# Patient Record
Sex: Female | Born: 1935 | Race: White | Hispanic: No | Smoking: Former smoker
Health system: Southern US, Community
[De-identification: ages and names within clinical notes are randomized; demographics above are authoritative.]

## PROBLEM LIST (undated history)

## (undated) DIAGNOSIS — F329 Major depressive disorder, single episode, unspecified: Secondary | ICD-10-CM

## (undated) DIAGNOSIS — E785 Hyperlipidemia, unspecified: Secondary | ICD-10-CM

## (undated) DIAGNOSIS — E039 Hypothyroidism, unspecified: Secondary | ICD-10-CM

## (undated) DIAGNOSIS — C801 Malignant (primary) neoplasm, unspecified: Secondary | ICD-10-CM

## (undated) DIAGNOSIS — M1990 Unspecified osteoarthritis, unspecified site: Secondary | ICD-10-CM

## (undated) DIAGNOSIS — IMO0001 Reserved for inherently not codable concepts without codable children: Secondary | ICD-10-CM

## (undated) DIAGNOSIS — K219 Gastro-esophageal reflux disease without esophagitis: Secondary | ICD-10-CM

## (undated) DIAGNOSIS — F0390 Unspecified dementia without behavioral disturbance: Secondary | ICD-10-CM

## (undated) DIAGNOSIS — Z8719 Personal history of other diseases of the digestive system: Secondary | ICD-10-CM

## (undated) DIAGNOSIS — N189 Chronic kidney disease, unspecified: Secondary | ICD-10-CM

## (undated) DIAGNOSIS — E079 Disorder of thyroid, unspecified: Secondary | ICD-10-CM

## (undated) DIAGNOSIS — I10 Essential (primary) hypertension: Secondary | ICD-10-CM

## (undated) DIAGNOSIS — N289 Disorder of kidney and ureter, unspecified: Secondary | ICD-10-CM

## (undated) DIAGNOSIS — K429 Umbilical hernia without obstruction or gangrene: Secondary | ICD-10-CM

## (undated) DIAGNOSIS — J189 Pneumonia, unspecified organism: Secondary | ICD-10-CM

## (undated) DIAGNOSIS — Z5189 Encounter for other specified aftercare: Secondary | ICD-10-CM

## (undated) DIAGNOSIS — G629 Polyneuropathy, unspecified: Secondary | ICD-10-CM

## (undated) DIAGNOSIS — I4891 Unspecified atrial fibrillation: Secondary | ICD-10-CM

## (undated) DIAGNOSIS — F32A Depression, unspecified: Secondary | ICD-10-CM

## (undated) DIAGNOSIS — N390 Urinary tract infection, site not specified: Secondary | ICD-10-CM

## (undated) HISTORY — PX: BACK SURGERY: SHX140

## (undated) HISTORY — PX: EYE SURGERY: SHX253

## (undated) HISTORY — PX: BREAST SURGERY: SHX581

## (undated) HISTORY — PX: JOINT REPLACEMENT: SHX530

---

## 2005-11-18 ENCOUNTER — Inpatient Hospital Stay (HOSPITAL_COMMUNITY): Admission: RE | Admit: 2005-11-18 | Discharge: 2005-11-22 | Payer: Self-pay | Admitting: Orthopedic Surgery

## 2006-11-17 ENCOUNTER — Inpatient Hospital Stay (HOSPITAL_COMMUNITY): Admission: RE | Admit: 2006-11-17 | Discharge: 2006-11-21 | Payer: Self-pay | Admitting: Orthopedic Surgery

## 2008-03-29 ENCOUNTER — Emergency Department (HOSPITAL_BASED_OUTPATIENT_CLINIC_OR_DEPARTMENT_OTHER): Admission: EM | Admit: 2008-03-29 | Discharge: 2008-03-29 | Payer: Self-pay | Admitting: Emergency Medicine

## 2008-09-28 ENCOUNTER — Encounter: Admission: RE | Admit: 2008-09-28 | Discharge: 2008-12-05 | Payer: Self-pay | Admitting: Internal Medicine

## 2009-01-24 ENCOUNTER — Ambulatory Visit: Payer: Self-pay | Admitting: Diagnostic Radiology

## 2009-01-24 ENCOUNTER — Emergency Department (HOSPITAL_BASED_OUTPATIENT_CLINIC_OR_DEPARTMENT_OTHER): Admission: EM | Admit: 2009-01-24 | Discharge: 2009-01-24 | Payer: Self-pay | Admitting: Emergency Medicine

## 2009-03-01 ENCOUNTER — Encounter: Admission: RE | Admit: 2009-03-01 | Discharge: 2009-03-01 | Payer: Self-pay | Admitting: General Surgery

## 2009-10-10 ENCOUNTER — Inpatient Hospital Stay (HOSPITAL_COMMUNITY): Admission: EM | Admit: 2009-10-10 | Discharge: 2009-10-12 | Payer: Self-pay | Admitting: Emergency Medicine

## 2010-05-18 LAB — CBC
HCT: 36.7 % (ref 36.0–46.0)
Hemoglobin: 12.1 g/dL (ref 12.0–15.0)
MCH: 33.4 pg (ref 26.0–34.0)
MCH: 34.2 pg — ABNORMAL HIGH (ref 26.0–34.0)
MCHC: 34.4 g/dL (ref 30.0–36.0)
MCHC: 34.5 g/dL (ref 30.0–36.0)
MCHC: 35.7 g/dL (ref 30.0–36.0)
MCV: 95.5 fL (ref 78.0–100.0)
MCV: 97.4 fL (ref 78.0–100.0)
MCV: 98 fL (ref 78.0–100.0)
Platelets: 188 10*3/uL (ref 150–400)
Platelets: 199 10*3/uL (ref 150–400)
Platelets: 209 10*3/uL (ref 150–400)
Platelets: 254 10*3/uL (ref 150–400)
RBC: 3.61 MIL/uL — ABNORMAL LOW (ref 3.87–5.11)
RBC: 4.12 MIL/uL (ref 3.87–5.11)
RDW: 13 % (ref 11.5–15.5)
RDW: 13.2 % (ref 11.5–15.5)
WBC: 11.7 10*3/uL — ABNORMAL HIGH (ref 4.0–10.5)
WBC: 5.2 10*3/uL (ref 4.0–10.5)

## 2010-05-18 LAB — COMPREHENSIVE METABOLIC PANEL
ALT: 17 U/L (ref 0–35)
AST: 19 U/L (ref 0–37)
AST: 25 U/L (ref 0–37)
AST: 29 U/L (ref 0–37)
Albumin: 3.4 g/dL — ABNORMAL LOW (ref 3.5–5.2)
Albumin: 3.6 g/dL (ref 3.5–5.2)
BUN: 21 mg/dL (ref 6–23)
BUN: 29 mg/dL — ABNORMAL HIGH (ref 6–23)
Calcium: 8.7 mg/dL (ref 8.4–10.5)
Calcium: 8.9 mg/dL (ref 8.4–10.5)
Creatinine, Ser: 0.78 mg/dL (ref 0.4–1.2)
Creatinine, Ser: 0.9 mg/dL (ref 0.4–1.2)
Creatinine, Ser: 0.93 mg/dL (ref 0.4–1.2)
GFR calc Af Amer: >60 mL/min (ref >60)
GFR calc Af Amer: >60 mL/min (ref >60)
GFR calc non Af Amer: 59 mL/min — ABNORMAL LOW (ref >60)
Glucose, Bld: 114 mg/dL — ABNORMAL HIGH (ref 70–99)
Glucose, Bld: 173 mg/dL — ABNORMAL HIGH (ref 70–99)
Potassium: 3.9 mEq/L (ref 3.5–5.1)
Sodium: 136 mEq/L (ref 135–145)
Total Bilirubin: 0.8 mg/dL (ref 0.3–1.2)
Total Bilirubin: 1.2 mg/dL (ref 0.3–1.2)
Total Protein: 6.4 g/dL (ref 6.0–8.3)

## 2010-05-18 LAB — GLUCOSE, CAPILLARY
Glucose-Capillary: 104 mg/dL — ABNORMAL HIGH (ref 70–99)
Glucose-Capillary: 114 mg/dL — ABNORMAL HIGH (ref 70–99)
Glucose-Capillary: 117 mg/dL — ABNORMAL HIGH (ref 70–99)
Glucose-Capillary: 118 mg/dL — ABNORMAL HIGH (ref 70–99)
Glucose-Capillary: 138 mg/dL — ABNORMAL HIGH (ref 70–99)
Glucose-Capillary: 151 mg/dL — ABNORMAL HIGH (ref 70–99)
Glucose-Capillary: 85 mg/dL (ref 70–99)
Glucose-Capillary: 88 mg/dL (ref 70–99)
Glucose-Capillary: 93 mg/dL (ref 70–99)
Glucose-Capillary: 96 mg/dL (ref 70–99)

## 2010-05-18 LAB — BASIC METABOLIC PANEL
Chloride: 112 mEq/L (ref 96–112)
Creatinine, Ser: 0.88 mg/dL (ref 0.4–1.2)
GFR calc Af Amer: >60 mL/min (ref >60)
Potassium: 4 mEq/L (ref 3.5–5.1)
Sodium: 143 mEq/L (ref 135–145)

## 2010-05-18 LAB — HEMOGLOBIN A1C
Hgb A1c MFr Bld: 5.9 % — ABNORMAL HIGH (ref <5.7)
Mean Plasma Glucose: 123 mg/dL — ABNORMAL HIGH (ref <117)

## 2010-05-18 LAB — URINALYSIS, ROUTINE W REFLEX MICROSCOPIC
Bilirubin Urine: NEGATIVE
Glucose, UA: NEGATIVE mg/dL
Nitrite: POSITIVE — AB
Specific Gravity, Urine: 1.018 (ref 1.005–1.030)
pH: 8 (ref 5.0–8.0)

## 2010-05-18 LAB — CULTURE, BLOOD (ROUTINE X 2): Culture: NO GROWTH

## 2010-05-18 LAB — TSH: TSH: 4.135 u[IU]/mL (ref 0.350–4.500)

## 2010-05-18 LAB — LIPASE, BLOOD: Lipase: 24 U/L (ref 11–59)

## 2010-05-18 LAB — DIFFERENTIAL
Basophils Absolute: 0 10*3/uL (ref 0.0–0.1)
Basophils Relative: 0 % (ref 0–1)
Lymphocytes Relative: 11 % — ABNORMAL LOW (ref 12–46)
Lymphs Abs: 1.3 10*3/uL (ref 0.7–4.0)
Monocytes Absolute: 0.5 10*3/uL (ref 0.1–1.0)

## 2010-05-18 LAB — URINE CULTURE: Culture  Setup Time: 201108091052

## 2010-06-18 LAB — BASIC METABOLIC PANEL
CO2: 28 mEq/L (ref 19–32)
Calcium: 9.1 mg/dL (ref 8.4–10.5)
Creatinine, Ser: 0.8 mg/dL (ref 0.4–1.2)
Glucose, Bld: 212 mg/dL — ABNORMAL HIGH (ref 70–99)
Sodium: 137 mEq/L (ref 135–145)

## 2010-06-18 LAB — DIFFERENTIAL
Basophils Absolute: 0.7 10*3/uL — ABNORMAL HIGH (ref 0.0–0.1)
Eosinophils Absolute: 0 10*3/uL (ref 0.0–0.7)
Lymphocytes Relative: 2 % — ABNORMAL LOW (ref 12–46)
Monocytes Absolute: 0.2 10*3/uL (ref 0.1–1.0)
Neutro Abs: 20.8 10*3/uL — ABNORMAL HIGH (ref 1.7–7.7)

## 2010-06-18 LAB — CBC
Hemoglobin: 12.8 g/dL (ref 12.0–15.0)
MCHC: 34.3 g/dL (ref 30.0–36.0)
RDW: 12.9 % (ref 11.5–15.5)

## 2010-06-18 LAB — URINALYSIS, ROUTINE W REFLEX MICROSCOPIC
Bilirubin Urine: NEGATIVE
Glucose, UA: 250 mg/dL — AB
Ketones, ur: NEGATIVE mg/dL
pH: 6 (ref 5.0–8.0)

## 2010-06-18 LAB — URINE CULTURE: Colony Count: >=100000

## 2010-06-18 LAB — GLUCOSE, CAPILLARY

## 2010-06-18 LAB — URINE MICROSCOPIC-ADD ON

## 2010-07-17 NOTE — Op Note (Signed)
Jodi Price, Jodi Price               ACCOUNT NO.:  [id number]   MEDICAL RECORD NO.:  [id number]          PATIENT TYPE:  INP   LOCATION:  2899                         FACILITY:  [facility]   PHYSICIAN:  Mila Homer. Sherlean Foot, M.D. DATE OF BIRTH:  [birth year]   DATE OF PROCEDURE:  11/17/2006  DATE OF DISCHARGE:                               OPERATIVE REPORT   SURGEON:  Mila Homer. Sherlean Foot, M.D.   ASSISTANT:  Legrand Pitts. Duffy, P.A.   ANESTHESIA:  General.   ANESTHESIA:  Spinal.   PREOPERATIVE DIAGNOSIS:  Left left osteoarthritis.   POSTOP DIAGNOSIS:  Left left osteoarthritis.   PROCEDURE:  Left total knee arthroplasty.   INDICATIONS FOR PROCEDURE:  The patient is an 87-year white female with  failure of conservative measures for osteoarthritis of the left knee.  Informed consent was obtained.   DESCRIPTION OF PROCEDURE:  The patient was administered spinal  anesthesia in the flexed seated position, and then laid supine and a  Foley catheter was placed.  I then sterilely prepped and draped the left  leg.  I then exsanguinated the left leg with the Esmarch and elevated  the tourniquet to 350 mmHg.  I then used a 10-blade to make a midline  incision.  I used a fresh blade to make a medial parapatellar arthrotomy  and performed a synovectomy.  I then elevated the deep MCL off the  medial crest of the tibia, but not releasing much distally since this  was a valgus knee.   Then I used the extramedullary alignment system on the tibia to make a  perpendicular cut to the anatomic axis of tibia.  I then used the  intramedullary alignment system on the femur to make a 4-degree valgus  distal femoral cut.  I sized this to a size D, and pinned the 4:1  cutting block into place, and made the anterior, posterior, and chamfer  cuts with the sagittal saw.   I then removed the bony pieces and then placed the lamina spreader in  the knee and removed the medial and lateral menisci, ACL, PCL, and  medial  posterior condylar osteophytes.  I then placed a 10-mm spacer  block in the knee and had good flexion/extension gap balance.  I then  finished the femur with a size D finishing block, finished the tibia  with a size 4 tibial tray.  I then trialed with a size 4 tibia, size D  femur, size 10 insert, and size 29 patella.  I had good  flexion/extension gap balance, good drop in angle back to the 130  degrees.  The patella tracked well.  I then removed the trial components  and copiously irrigated.  I then cemented in a size 4 patella, size D  femur, snapped in a real 10 polyethylene, removed all the excess cement,  cemented in the 29 patella.  I then dropped the tourniquet when the  cement was hard.  This was a 44 minus.  I then copiously irrigated,  again.  I then closed the arthrotomy with figure-of-eight #1 Vicryl  sutures, deep soft tissues with buried #  0 Vicryl sutures, and then a  subcuticular 3-0 stitch with skin staples.  Dressed with Xeroform  dressing, sponges, and sterile Webril and TED stocking.   COMPLICATIONS:  None.   DRAINS:  One Hemovac and one pain catheter.   ESTIMATED BLOOD LOSS:  300 mL.   TOURNIQUET TIME:  44 minutes.           ______________________________  Mila Homer Sherlean Foot, M.D.     SDL/MEDQ  D:  11/17/2006  T:  11/17/2006  Job:  [id number]

## 2010-07-20 NOTE — H&P (Signed)
Jodi Price, BHULLAR               ACCOUNT NO.:  [id number]   MEDICAL RECORD NO.:  [id number]          PATIENT TYPE:  INP   LOCATION:  NA                           FACILITY:  [facility]   PHYSICIAN:  Mila Homer. Sherlean Foot, M.D. DATE OF BIRTH:  29-Mar-1919   DATE OF ADMISSION:  11/18/2005  DATE OF DISCHARGE:                                HISTORY & PHYSICAL   CHIEF COMPLAINT:  Bilateral knee pain, right greater than left.   HISTORY OF PRESENT ILLNESS:  Jodi Price is an 77 year old white female with  bilateral leg pain, right greater than left.  The patient has had knee pain  for years.  The right knee pain became progressively worse.  No known injury  to the right knee.  Right knee pain increases at night and with prolonged  ambulation.  Mechanical symptoms are positive for giving way and catching.  The patient needs a walker or cane to ambulate; she also uses a pullover  knee sleeve with some relief.  She has failed conservative treatment, which  included Synvisc injections.  X-rays of the right knee showed bone-on-bone  in medial and lateral compartments.   ALLERGIES:  No known drug allergies.   MEDICATIONS:  1. Toprol 100 mg one daily.  2. Benicar 40/25 one daily.  3. Prevacid 30 mg one daily in the a.m.  4. Glipizide 2.5 mg one b.i.d.  5. Levothroid 75 mcg one daily.  6. Vytorin 10/20 one tablet nightly.  7. Actonel 30 mg one weekly in a.m.  8. Adalat 400 mg b.i.d.  9. Percocet 5/325 mg one tablet q.4-6 h. as needed.   PAST MEDICAL HISTORY:  1. Diabetes mellitus.  2. Hypertension.  3. Coronary artery disease.  4. Mild normocytic anemia.  5. Mild renal insufficiency.  6. First-degree AV block.  7. History of bilateral DVTs in 1950.   PAST SURGICAL HISTORY:  1. Tonsillectomy in 1937.  2. Partial left ovary removal in 1948; at that time, she also had an      appendectomy.  3. Back surgery.  4. Lumbar fusion, unknown level, 1950.  5. Tubal, 1959.  6. Right breast cancer in 1994  with mastectomy; 2001, last mastectomy.      The patient denies any complications and questions possible blood      transfusion.   SOCIAL HISTORY:  Smoked a half a pack of cigarettes __________ for  approximately 20 years; she quit 15-20 years ago.  She uses no alcohol.  She  lives in a 1-story residence with 3 steps to formal entrance.   FAMILY HISTORY:  Patient's mother deceased at age 24 and had a history of  hypertension, strokes and kidney disease.  Father deceased at age 67 due to  heart disease, hypertension and history of strokes.  She has 1 daughter who  passed away at age 47 due to CVA.  A second daughter is age 77 and has lung  cancer.  She has 1 son, age 67, with a history of strokes and a 27 year old  son who is reportedly healthy.   REVIEW OF SYSTEMS:  Positive for  cataracts and surgery 5-6 years ago.  The  patient has dentures, both top and bottom.  Remote history of bronchitis.  Hypertension.  History of DVT after childbirth in 1950; this was bilateral  lower extremities.  She has constipation secondary to pain medications.  History of whooping cough as a child.  History of breast cancer with  mastectomy.  The patient is hypothyroid and has diabetes mellitus.  Remote  history of kidney infections with frequent urinary tract infections and  nocturia.  No acute dysuria.  Bilateral knee pain.  Otherwise, review of  systems is negative or noncontributory.  The patient has decreased hearing  and ringing in her ears, also difficulty swallowing with a history of  esophageal dilatation.   PHYSICAL EXAMINATION:  GENERAL:  The patient is a well-developed, well-  nourished female who walks with an antalgic gait and limp on the right.  The  patient's mood and affect are appropriate.  She talks easily with examiner.  VITAL SIGNS:  Temperature 97.2, pulse 70, respiratory rate 16, blood  pressure is 132/80.  HEENT:  Head is normocephalic, atraumatic, without frontal or maxillary   sinus tenderness to palpation.  Conjunctivae are pink.  Sclerae are  anicteric.  PERRLA.  EOMs are intact.  No visible external ear deformities.  No visible external ear deformities.  TM is pearly gray on the left, heavy  cerumen in the right ear and unable to visualize TM.  Nose and nasal septum  midline.  Nasal mucosa pink and moist without polyps.  Buccal mucosa pink  and moist.  The patient has dentures, both top and bottom.  Oropharynx  without any erythema or exudate.  Tongue and uvula midline.  NECK:  Trachea is midline.  No lymphadenopathy.  Carotids are 2+ without  bruits.  No tenderness along the cervical spine with palpation.  The patient  has full range of motion of the cervical spine without pain.  BACK:  No tenderness with palpation over the thoracic or lumbar spine.  BREASTS, GENITOURINARY AND RECTAL:  Exams all deferred at this time.  NEUROLOGIC:  The patient is alert and oriented x3.  Cranial nerves II-XII  are grossly intact.  Lower extremity strength testing reveals 5/5 strength  throughout.  CARDIAC:  Regular rate and rhythm, occasional skipped beat.  CHEST:  Lungs are clear to auscultation bilaterally, no wheezing, rhonchi or  rales noted.  ABDOMEN:  Soft and nontender.  No hepatomegaly and no splenomegaly noted.  Bowel sounds x4 quadrants.  MUSCULOSKELETAL:  Upper extremities are equal and symmetric in size and  shape.  The patient does have arthritic changes of both hands.  She has full  range of motion of the upper extremities at the shoulders, elbows, wrists  and hands.  Radial pulses are 2+ with irregularly irregular pulse.  Lower  extremities:  The patient has full range of motion of both hips without  pain.  Right knee, 0-101 degrees of flexion and approximately 5 degrees of  valgus deformity.  Medial and lateral joint line tenderness with palpation.  No effusion and no edema is noted.  Valgus and varus stressing reveals no laxity.  Left knee, 0-105 degrees, no  joint line tenderness with palpation.  Valgus and varus stressing reveals no laxity, no effusion and no edema.  Calves are nontender.  Dorsalis pedal pulses are 2+ bilaterally.  The  patient has good sensation in the toe tips throughout.   IMPRESSION:  1. Bilateral knee pain with end-stage osteoarthritis of the  right knee,      which has failed conservative treatment.  2. Hypertension.  3. Coronary artery disease.  4. Mild normocytic anemia.  5. Mild renal insufficiency.  6. First-degree atrioventricular block.  7. History of deep venous thrombosis in bilateral lower extremities in      1950 after childbirth.  8. History of esophageal dilatation.  9. Decreased hearing loss.   PLAN:  The patient is to be admitted to [facility] on November 18, 2005 to undergo a right total knee arthroplasty.  Prior to surgery, the  patient has undergone all preoperative lab testing.  The patient did  received clearance from her primary care physician, Dr. Ninetta Lights, with  [facility] in [place], [place]; phone number is  [phone].      Richardean Canal, P.A.    ______________________________  Mila Homer. Sherlean Foot, M.D.    GC/MEDQ  D:  11/14/2005  T:  11/14/2005  Job:  [id number]

## 2010-07-20 NOTE — Discharge Summary (Signed)
Jodi Price, Jodi Price               ACCOUNT NO.:  [id number]   MEDICAL RECORD NO.:  [id number]          PATIENT TYPE:  INP   LOCATION:  5031                         FACILITY:  [facility]   PHYSICIAN:  Jodi Price, M.D. DATE OF BIRTH:  [birth year]   DATE OF ADMISSION:  11/17/2006  DATE OF DISCHARGE:  11/21/2006                               DISCHARGE SUMMARY   ADMISSION DIAGNOSIS:  Advanced degenerative arthritis of the left knee.   DISCHARGE DIAGNOSES:  1. Advanced degenerative arthritis of the left knee.  2. Urinary tract infection.  3. Post hemorrhagic anemia.  4. Diabetes mellitus type 2.  5. Coronary artery disease.  6. First-degree atrioventricular block.  7. Mild normocytic anemia.  8. History of renal insufficiency.  9. History of hypertension.  10.Hypokalemia.  11.Sleep apnea.   PROCEDURE:  Left total knee arthroplasty.   HISTORY:  Ms. Jodi Price is a very pleasant 77 year old female who has had  left knee pain since falling this past summer.  She has had no surgeries  to the left knee.  She underwent a right total knee arthroplasty in  September of 2007 and has done well.  Her left knee has pain constantly  with radiation to the ankle.  She has failed conservative treatment  which included Synvisc, cortisone injections, and medications.  She has  pain which wakes her at night.  She must use a cane.  X-rays of the left  knee reveal end-stage osteoarthritis of the left knee.  She is now  indicated for total knee replacement.   HOSPITAL COURSE:  77 year old female admitted November 17, 2006, and  after appropriate laboratory studies were obtained as well as 1 gram of  Ancef IV on-call to the operating room, she was taken to the operating  room where she underwent a left total knee arthroplasty by Dr. Georgena Price, assisted by Jodi Morale, PA-C.  She tolerated the procedure well.  She was placed on a 60 gram modified carb diet.  Ancef 1 gram IV q.6h.  x3 doses was continued  postoperatively.  Lovenox was started on  November 18, 2006 at 8 a.m. of 30 mg subcu q.12h.  A Dilaudid PCA pump  was used for pain management.  A Foley was placed intraoperatively.  Thigh-high TED hose were placed.  Blood counts obtained.  CPM 0-90  degrees 6-8 hours per day.  Consults with PT, OT, and care management  were made.  Weightbearing as tolerated.  She was allowed out of bed to a  chair the following day.  Her PCA was discontinued.  Fluid restrictions  of 1000 mL per 24 hours for hyponatremia was ordered.  Lovenox  instructions were given.  Given 1 unit of packed cells over 2-3 hours  and pretreated with Tylenol 325 and Benadryl 6.25 prior to blood.  Lasix  10 mg IV after blood was given.   On the 17th, she did have some cardiac issues, and a stat EKG and  cardiac enzymes were ordered.  Her hydrochlorothiazide was discontinued.  Her fluid restrictions were then taken to 800 mL per 24 hours.  KCl  40  mEq p.o. b.i.d. was then ordered.  Hospice was also consulted.  A stat  magnesium level was added to the blood work for the 17th.  Urine sodium,  potassium, and osmolality was also ordered.  The patient was started on  Norvasc 5 mg p.o. b.i.d.  Placed on an IV sodium chloride, normal  sterile saline at 50 mL per hour for 24 hours.   She has had problems with constipation and a urinary tract infection.  Her Foley was discontinued on the 18th.  She was started on Cipro 25 mg  p.o. q.12h. for 3 days.  Magnesium sulfate 2 grams IV over 1 hour for  one run was started on the 18th.  K-Dur 40 mEq p.o. x1 was also ordered.  The patient was placed on Celebrex 200 mg, 2 stat and then 1 p.o. b.i.d.  on the 18th.   On the 19th, stat H&H was ordered, and she was given 1 more unit of  blood on the 19th and then she was allowed to be discharged.  She was  discharged in improved condition.   EKG on November 11, 2006 showed sinus rhythm with first-degree AV block,  moderate voltage criteria  for LVH, maybe normal variant.   LABORATORY STUDIES:  Hemoglobin 12.4, hematocrit 36.2%, white count  8500, platelets 297,000.  Discharge hemoglobin 8.0, hematocrit 22.9.  This was prior to her blood.  Reticulocyte count absolute was 59.1.  Pro  time was 13.0, INR 1.0, PTT 30.  Admission sodium 133, potassium 2.9,  chloride 95, CO2 28, glucose 107, BUN 16, creatinine 0.96.  GFR was 55.  Total protein 6.8.  Total protein 6.8, albumin 4.2, AST 21, ALT 15, ALP  60, total bilirubin 0.7.  Discharge sodium 133, potassium 4.5, chloride  100, CO2 27, glucose 96, BUN 10, creatinine 0.77.  GFR was greater than  60.  Osmolalities were 255 on the 17th and 305 for urine osmolality on  the 17th.  Magnesium was 1.3.  At the time of discharge, magnesium was  2.0, uric acid 4.1.  Glycosylated hemoglobin 5.9.  Cardiac enzymes  revealed CK 118, MB 2.9, index 2.5, troponin 0.02.  Repeat of enzymes  sequentially revealed a CK of 115, MB 4.2, index 3.7, troponin 0.03 and  finally 114 with an MB of 3.7, index 3.2 and troponin 0.02.  Lipids  revealed cholesterol 99, triglycerides 81, HDL cholesterol 27, total  cholesterol to HDL ratio 3.7, VLDL 16, LDL 56.  Average risk was one-  half.  TSH 4.158.  Iron 16, IBC 236, saturation 7, UIBC 220.  B12 232.  Folate 15.7, ferritin 117.  Urine sodium was less than 10.  Potassium  was 38.  Urinalysis on November 11, 2006 revealed trace leukocyte  esterase with many epithelials, 3-6 white cells, no red cells, few  bacteria.  On November 20, 2006 revealed small amount of hemoglobin, 30  protein, moderate leukocyte esterase, rare epitheliums, 7-10 whites, 0-2  reds, rare bacteria.  Urine culture of November 11, 2006 and November 20, 2006 revealed no growth.   DIET:  As per home.  Otherwise, no restrictions.   ACTIVITY:  She will increase her activity slowly.  Use her crutches.  No  driving or lifting for 6 weeks.  CPM 0-90 degrees for 6-8 hours per day.  Follow blue  instruction sheet.   DISCHARGE MEDICATIONS:  1. Lovenox 40 mg 1 injection daily x10 days.  2. Cipro 250 one tablet twice day x3 days.  3. Norvasc 5 mg 1 tablet twice a day.  4. Celebrex 200 mg 1 tablet b.i.d..  5. Iron sulfate 325 daily for 30 days.  6. Stop her aspirin until finished with Lovenox and then resume normal      dosing.  7. Stop Benicar HCT and Tylenol.   FOLLOW UP:  She will follow up with Dr. __________  in 7-10 days.  Follow up with Dr. Sherlean Price on Tuesday, December 02, 2006.   CONDITION ON DISCHARGE:  Discharged in improved condition.      Oris Drone Petrarca, P.A.-C.    ______________________________  Jodi Price, M.D.    BDP/MEDQ  D:  01/16/2007  T:  01/16/2007  Job:  [id number]

## 2010-07-20 NOTE — Op Note (Signed)
Jodi Price, Jodi Price               ACCOUNT NO.:  [id number]   MEDICAL RECORD NO.:  [id number]          PATIENT TYPE:  INP   LOCATION:  5031                         FACILITY:  [facility]   PHYSICIAN:  Mila Homer. Sherlean Foot, M.D. DATE OF BIRTH:  1920-03-03   DATE OF PROCEDURE:  11/18/2005  DATE OF DISCHARGE:  10/31/2005                                 OPERATIVE REPORT   SURGEON:  Mila Homer. Sherlean Foot, M.D.   ASSISTANTArlys John D. Petrarca, P.A.-C.   ANESTHESIA:  General.   PREOPERATIVE DIAGNOSIS:  Right knee osteoarthritis.   POSTOPERATIVE DIAGNOSIS:  Right knee osteoarthritis.   PROCEDURE:  Right total knee arthroplasty.   INDICATIONS FOR PROCEDURE:  The patient is a 77 year old white female with  failure of conservative management of osteoarthritis of the knee.  An  informed consent was obtained.   DESCRIPTION OF PROCEDURE:  The patient was laid supine on the table and  after general anesthesia the right leg was prepped and draped in the usual  sterile fashion. After the extremity was exsanguinated and the tourniquet  inflated with 350 mmHg a midline incision was made with a #10 blade.  This  incision was 6 inches was length.  A new blade was used to make a median  parapatellar arthrotomy to perform synovectomy.  I elevated deep MCL with  the Mayo across the tibia and everted the patella.  The patella measured 22  mm thick.  I reamed down to 13 and then drilled 3 lock holes and with the 29  prosthesis in place we repaired the 22 mm thickness.  I then removed the  prosthesis and went into flexion.  I used the intramedullary alignment  system to remove the perpendicular cut to the anatomic axis of the tibia,  removing 2 mm spun off the medial side versus low side.  Then using the  intramedullary guide to make a distal femoral cut 6 degrees in valgus, then  went through the anatomic access.  Then measuring out the epicondylar  access, posterior condylar ankle measured 2 degrees.  I pinned  through the 3  degree hole with the E cutting block in place, I used the 4 in 1 cutting  block to make the anterior, posterior and chamber cuts.  I then placed a  lamina spreader in the knee.  I removed the ACL, PCL, mediolateral menisci  and posterior common osteophytes.  I then placed a 10 mm spacer block and I  had good flexion, extension, gap and balance. There was a little bit of  tightness in the posterolateral corner causing a bit of an external rotation  of the femoral component in flexion.  I released that under tension and it  corrected this problem.  I then irrigated copiously and then finished the  femur with a size E finishing block, joining for the __________ and cutting  for the box.  I finished the tibia with a size 5 tibial tray drilling kit.  Then trial with the 5 tibia, E femur, 10 insert, 29 patella and had I good  flexion, extension, gap, balance and good patella  tracking.  Then I removed  the trial components and copiously irrigated.  I cemented in the components  and removed excess cement.  We let the cement harden in extension.  Then the  left Hemovac deep to the arthrotomy and a pain catheter superficial to the  arthrotomy.  We closed the arthrotomy after the tourniquet was let down and  hemostasis obtained at 55 minutes.  We closed the arthrotomy with  interrupted figure-of-8 #1 Vicryl sutures, deep soft tissues with  interrupted 0 Vicryl sutures and subcuticular with 2-0 Vicryl sutures, and  the skin with staples.  Complications none.   DATA REVIEWED:  One Hemovac and one pain catheter.   ESTIMATED BLOOD LOSS:  300 mL.           ______________________________  Mila Homer. Sherlean Foot, M.D.     SDL/MEDQ  D:  11/18/2005  T:  11/18/2005  Job:  [id number]

## 2010-07-20 NOTE — Consult Note (Signed)
NAMELYNDSEE, CASA NO.:  [id number]   MEDICAL RECORD NO.:  [id number]          PATIENT TYPE:  INP   LOCATION:  5031                         FACILITY:  [facility]   PHYSICIAN:  Hettie Holstein, D.O.    DATE OF BIRTH:  1919-04-17   DATE OF CONSULTATION:  DATE OF DISCHARGE:                                   CONSULTATION   PRIMARY CARE PHYSICIAN:  Dr. Ninetta Lights of [facility].   REFERRING PHYSICIAN:  Dr. Murphy/Dr. Sherlean Foot of orthopedic surgery.   REASON FOR CONSULTATION:  Fever and questionable transfusion reaction.   HISTORY OF PRESENT ILLNESS:  Mrs. Ostlund is a very pleasant female with  history of end stage osteoarthritis who presented for elective right total  knee arthroplasty on November 18, 2005.  She is postoperative day #1.  The  intraoperative report was reviewed which revealed no indication of  hypotensive events or complications.  She did have an estimated blood loss  of about 300 mL.  She was placed under general anesthesia.  Her preoperative  hemoglobin was 12.0 and her postoperative hemoglobin was 8.2.   On postoperative day #1, she underwent a type and cross for 2 units of  packed RBC's.  She began to feel some back pain and developed some chills  with her second unit of packed RBC's.  This persisted however, resolved  after discontinuation of the transfusion.  She developed a fever spike of  101.5 and the family stated that she was confused.  However, this has  completely resolved at this point.  She is comfortable in no acute distress.   She states that she has undergone colonoscopic evaluation within the past  year.  Her primary care physician, Dr. Kristen Loader, has noted to her that she was  mildly anemic.  Her indices during this admission revealed that she is  normocytic, her MCV is 95.  In any event, symptoms have resolved at this  point.  Documentation notates that she has a history of coronary artery  disease however, upon further  questioning of the patient and of the family,  this is not known.  She had undergone a nuclear stress test within the past  year that she was told was not remarkable for coronary disease.   PAST MEDICAL HISTORY:  Significant for diabetes mellitus with evidence for  excellent control, hemoglobin A1c is 5.7 during this admission,  hypertension, gastroesophageal reflux disease, hypothyroidism, history of  bilateral DVT's in the 1950's during the birth of her son.  Status post  tonsillectomy, appendectomy, partial oophorectomy on the left, and bilateral  tubal ligation in 1959.  She has undergone lumbar fusion surgery in the  1950's, she has had bilateral mastectomies.   CURRENT MEDICATIONS:  Under home medications, she takes:  1. Vytorin 10/20 q.p.m.  2. Synthroid 75 mcg daily.  3. Toprol 100 daily.  4. She takes a proton pump inhibitor, in addition takes Benicar 30 mg p.o.      q. a.m.  5. Xanax.  6. Glucotrol XL 2.5 b.i.d.  7. Actonel.  8. Toradol.   ALLERGIES:  She reports no known drug allergies.   SOCIAL HISTORY:  She lives with her daughter's family.  She quit smoking  about 15-20 years ago.  She denies alcohol.   FAMILY HISTORY:  Her mother died at age 18 with cerebrovascular disease and  kidney disease.  Her father died at age 66 due to heart disease as well as  strokes.   REVIEW OF SYSTEMS:  She has had, during this hospital course, some confusion  that has resolved.  She denies headache.  Denies nausea or vomiting.  CARDIOVASCULAR:  Denies chest pain or palpitations.  RESPIRATORY:  She denies shortness of breath, cough, or any productive  cough.  GI:  She has had no nausea, vomiting, diarrhea.  No hematochezia or melena  or coffee ground emesis.  ENDOCRINE:  Her CBG's have been well maintained and controlled during this  course.  Hemoglobin A1c is 5.7.  MUSCULOSKELETAL:  She has done quite well postoperatively, beginning to  progress with physical therapy.   NEUROLOGIC:  Ditto of the above.  She had transient episode of confusion  that has resolved at this point.   PHYSICAL EXAMINATION:  Her vital signs have remained stable.  She had a  temperature spike during her transfusion but this come to normal level.  Respirations 18, blood pressure 148/76, O2 saturation 98% on 4L.  GENERAL:  Patient is awake, alert, in no acute distress, answering all  questions appropriately.  HEENT:  Reveals her head to be normocephalic, atraumatic.  Extraocular  muscles are intact.  She has had cataract removal and this is evident on  exam.  NECK:  Supple, nontender, no thyromegaly or mass.  CARDIOVASCULAR:  Reveals normal S1, S2.  No murmur.  LUNGS:  Are clear to auscultation bilaterally.  She exhibits normal effort.  There is no dullness to percussion.  ABDOMEN:  Is soft and non tender.  EXTREMITIES:  Lower extremities reveal no edema.  Peripheral pulses are  symmetrical and palpable.   LABORATORY DATA:  Reveals sodium to be 134, potassium 3.6, BUN 13,  creatinine 1.0, glucose 108, wbc of 5.0, hemoglobin 8.2, this was pre  transfusion.  Platelet count 217, MCV of 92, hemoglobin A1c is 5.7.  Urine  culture read pan-sensitive  E. coli.  EKG previously revealed a first degree  AV block and sinus rhythm.   ASSESSMENT:  1. Transfusion reaction:  With recommendations to pre medicate with future      transfusions with Tylenol and Benadryl prior to any subsequent      transfusions.  2. Postoperative anemia due to acute blood loss:  This is normocytic.  She      has undergone a colonoscopic screening as an outpatient within the past      year.  Would perhaps recommend checking B12 and folate levels as well      as a reticulocyte count and iron saturation level.  3. Diabetes mellitus with evidence of excellent control.  4. Hyponatremia felt to be secondary to hydrochlorothiazide plus IV     fluids.  Would recommend discontinuing hydrochlorothiazide at this time       as Mrs. Stepien states that she does not take this medication at home.      Would KVO IV fluids and perhaps check a TSH with the next lab draw or      add on.  5. History of hypertension:  Fair control during this course.  Would      continue.  6. Urinary tract infection with  pan sensitive Escherichia coli:  she is      asymptomatic.  She has received peri operative doses of Ancef.  Would      perhaps continue one more day of treatment with Ciprofloxacin to      complete 3-5 days total.  7. History of tobacco abuse, currently non smoking for 15-20 years on      postoperative day #1.   PLAN:  Would continue to follow Mrs. Cumbee H and H and order additional  studies as noted above.  Would continue one more day of treatment for pan  sensitive E. coli urinary tract infection.  Continue to administer DVT  prophylaxis.      Hettie Holstein, D.O.  Electronically Signed     ESS/MEDQ  D:  11/19/2005  T:  11/20/2005  Job:  [id number]   cc:   Loreta Ave, M.D.  Ninetta Lights

## 2010-07-20 NOTE — Discharge Summary (Signed)
Jodi Price, Jodi Price               ACCOUNT NO.:  [id number]   MEDICAL RECORD NO.:  [id number]          PATIENT TYPE:  INP   LOCATION:  5031                         FACILITY:  [facility]   PHYSICIAN:  Mila Homer. Sherlean Foot, M.D. DATE OF BIRTH:  1919/04/26   DATE OF ADMISSION:  11/18/2005  DATE OF DISCHARGE:  11/22/2005                                 DISCHARGE SUMMARY   ADMISSION DIAGNOSES:  1. Bilateral knee pain with endstage osteoarthritis, right knee, which      failed conservative treatment.  2. Hypertension.  3. Coronary artery disease.  4. Mild normocytic anemia.  5. Mild renal insufficiency.  6. First degree atrioventricular block.  7. History of a deep vein thrombosis in bilateral lower extremities after      child birth.  8. History of esophageal dilatation.  9. Decreased hearing.  10.Diabetes mellitus.   DISCHARGE DIAGNOSES:  1. Status post right total knee arthroplasty.  2. Acute blood loss anemia secondary to surgery with transfusion of 1      unit.  3. Transfusion reaction with elevated temperature and chills.  4. Hyponatremia without symptoms.  5. Hypokalemia, resolved.  6. Thrombocytopenia, no symptoms.  7. Slight confusion, felt to be secondary to pain medicines and      anesthesia, resolved.  8. E. Coli urinary tract infection, on Cipro.  9. History of hypertension.  10.History of coronary artery disease.  11.History of mild normocytic anemia.  12.History of mild renal insufficiency.  13.First degree atrioventricular block.  14.History of a deep vein thrombosis in bilateral lower extremities in the      late 50s, after child birth.  15.History of esophageal dilatation.  16.Decreased hearing.  17.Diabetes mellitus.   ALLERGIES:  NO KNOWN DRUG ALLERGIES.   MEDICATIONS:  1. Toprol 100 mg 1 daily.  2. Benicar 40/25 1 tab daily.  3. Prevacid 30 mg 1 daily in a.m.  4. Glipizide 2.5 mg 1 b.i.d.  5. Levothroid 75 mcg 1 daily.  6. Vytorin 10/20 1 tab nightly.  7.  Atenolol 30 mg 1 weekly in the a.m.  8. Adalat 400 mg b.i.d.  9. Percocet 5/325 1 tab q.4 to 6 hours as need for pain.   SURGICAL PROCEDURE:  Patient was taken to the operating room on November 18, 2005 by Dr. Darcella Cheshire, assisted by Jacqualine Code P.A.-C.  Patient  was placed under general anesthesia and a right total knee arthroplasty was  performed.  The following components were used:  An all-poly patella, size  32, 8.5 mm thickness.  A size E femoral component.  A size 5 tibial  component.  A 10 mm bearing.  Patient tolerated the procedure well and  returned to the recovery room in good and stable condition.   CONSULTATIONS:  The following consults were obtained while patient was  hospitalized:  PT/OT, case management, [facility], hospitalist.   HOSPITAL COURSE:  On postop day #1, patient doing well, pain under control.  Denied having any chest pain, shortness of breath, nausea, or vomiting.  No  dizziness.  T-max 99.5, blood pressure 106/48, heart rate 66, respiratory  rate 20, and 100% on 2 liters of oxygen.   H and H 8.2 and 23.2.  Patient was to be transfused with 2 units of packed  red blood cells.  Patient without calf tenderness noted on postop day #1.  Later that day, patient did have a reaction 30 minutes into the second unit  of packed red blood cells with her temperature rising from 99.5 to 101.5.  Patient complained of chills.  The second transfusion was stopped.  No  reaction to the first unit of packed red blood cells.  Patient's preop  urinalysis showed a UTI with E. Coli and patient was placed on Cipro.   Postop day #2, denied chest pain, shortness of breath, nausea, vomiting,  weakness, or dizziness.  Pain under control.  Tolerating diet.  T-max 100.9,  blood pressure 125/59, heart rate 73, respiratory rate 18, O2 saturation  93%.  H and H 8.7 and 24.7 status post 1 unit of packed red blood cells.  Patient was hypokalemic with a potassium of 3.3 and  hyponatremic with a  sodium of 128.  Hydrochlorothiazide was placed on hold, potassium was  replaced.  The patient with decrease in platelets from 286,000, on  admission, to 142,000.  No active bleeding.   Postop day #3, patient with no complaints.  T-max 100.8.  Vital signs  stable.  H and H 9.1 and 25.8, platelets 190,000.  Sodium 131, potassium  3.7.  The patient worked with physical therapy and was to be discharged home  later that day.  However, due to patient's progress with physical therapy,  patient's family was uncomfortable with taking her home.  Discharge was  held.  Doppler for DVT was negative for DVT.   Postop day #4, patient progressing well with physical therapy and wanting to  go home.  No chest pain, no shortness of breath, nausea, or vomiting.  T-max  100.5.  Vital signs all within normal limits.  Wound was benign.  Calf  supple and nontender.   Patient, on day #4, was then discharged later that day to home in good and  stable condition.   LABORATORY DATA:  Routine labs on admission:  CBC:  White count was 8100,  hemoglobin 12, hematocrit 24.9 and low, platelets 286,000.  Special  hematology studies:  Percent retics 1.4 and within normal limits, RBCs 2.87  and low, retake ABS was 40.2.  Coags on admission:  All values within normal  limits.  Routine chemistries on admission:  Sodium was low at 132, potassium  3.5, chloride 99, bicarb 23, glucose 79, BUN 21, creatinine 1, calcium 9.6.  Hepatic enzymes on admission:  All values within normal limits.  Hemoglobin  A1c, on admission, was 5.7.  Anemia studies:  Iron was low at 41 mcg per dl,  Z61 was within normal limits at 258 mcg per mL, RBC folate was elevated at  1172 mcg per mL.   Urinalysis, on admission, was showed few bacteria.  However, urine cultures  showed greater than 100,000 per mL of E. Coli.   Doppler, dated November 20, 2005, shows bilateral extremities with no  evidence of DVT, SVT, or baker's cyst  bilaterally.   DISCHARGE INSTRUCTIONS:  1. Diet:  A modified carb diet.  2. Activity:  Weightbearing as tolerated on the right leg with a walker.  3. Wound care:  Patient is to keep wound clean and dry.  Change dressing      daily.  Call for temperatures greater than 101.5,  foul-smelling      drainage, or poor pain control.  May shower for after 2 days if no      drainage.   DISCHARGE MEDICATIONS:  Patient to resume home meds except for Benicar and  Adalat.  Add the following:   1. Percocet 5/325 1 to 2 tablets every 4 to 6 hours for pain.  2. Lovenox 40 mg 1 injection daily at 08:00 a.m. x11 days.  3. Olmesartan 40 mg 1 tab daily.  4. Over-the-counter iron 150 mg 1 tab twice daily.  5. Cipro 250 mg 1 tab p.o. q12 hours x 3 days.   FOLLOWUP:  1. Patient needs to follow up with Dr. Sherlean Foot 11 days from discharge.  The      patient is to call the office at [phone] for an appointment.  2. Follow up with Dr. Kristen Loader in 7 to 10 days from discharge.  Follow up on      blood pressure and anemia.  3. Home-health PT per Genevieve Norlander.   SPECIAL INSTRUCTIONS:  CPM 0 to 90 degrees 6 to 8 hours a day.  TED hose  during the day, both legs.   CONDITION ON DISCHARGE:  Patient was discharged to home in good and stable  condition.      Richardean Canal, P.A.    ______________________________  Mila Homer. Sherlean Foot, M.D.    GC/MEDQ  D:  12/06/2005  T:  12/07/2005  Job:  [id number]   cc:   Mila Homer. Sherlean Foot, M.D.  Ninetta Lights

## 2010-12-13 LAB — NA AND K (SODIUM & POTASSIUM), RAND UR
Potassium Urine: 38
Sodium, Ur: <10

## 2010-12-13 LAB — URINALYSIS, ROUTINE W REFLEX MICROSCOPIC
Bilirubin Urine: NEGATIVE
Specific Gravity, Urine: 1.012
Urobilinogen, UA: 1
pH: 7.5

## 2010-12-13 LAB — COMPREHENSIVE METABOLIC PANEL
ALT: 10
BUN: 11
CO2: 27
Calcium: 8.6
Creatinine, Ser: 0.89
GFR calc non Af Amer: >60
Glucose, Bld: 111 — ABNORMAL HIGH
Sodium: 126 — ABNORMAL LOW

## 2010-12-13 LAB — BASIC METABOLIC PANEL
BUN: 10
BUN: 13
BUN: 14
BUN: 24 — ABNORMAL HIGH
CO2: 28
CO2: 28
Calcium: 8.1 — ABNORMAL LOW
Calcium: 8.2 — ABNORMAL LOW
Chloride: 100
Chloride: 89 — ABNORMAL LOW
Chloride: 90 — ABNORMAL LOW
Chloride: 95 — ABNORMAL LOW
Chloride: 96
Creatinine, Ser: 0.85
Creatinine, Ser: 0.86
Creatinine, Ser: 0.93
Creatinine, Ser: 1.05
GFR calc Af Amer: >60
GFR calc Af Amer: >60
Glucose, Bld: 164 — ABNORMAL HIGH
Glucose, Bld: 96
Potassium: 3.7
Potassium: 4.5
Sodium: 126 — ABNORMAL LOW

## 2010-12-13 LAB — CARDIAC PANEL(CRET KIN+CKTOT+MB+TROPI)
CK, MB: 2.9
CK, MB: 4.2 — ABNORMAL HIGH
Relative Index: 3.7 — ABNORMAL HIGH
Total CK: 115
Total CK: 118
Troponin I: 0.02
Troponin I: 0.02

## 2010-12-13 LAB — CBC
HCT: 23.2 — ABNORMAL LOW
HCT: 25.2 — ABNORMAL LOW
HCT: 28.6 — ABNORMAL LOW
Hemoglobin: 9.8 — ABNORMAL LOW
MCHC: 34.4
MCHC: 34.8
MCHC: 35.2
MCV: 94.3
MCV: 94.5
MCV: 95.4
MCV: 95.6
Platelets: 185
Platelets: 201
Platelets: 211
RBC: 3.02 — ABNORMAL LOW
RDW: 13.3
WBC: 7.7
WBC: 9.3

## 2010-12-13 LAB — LIPID PANEL
Cholesterol: 99
LDL Cholesterol: 56

## 2010-12-13 LAB — IRON AND TIBC
Iron: 16 — ABNORMAL LOW
TIBC: 236 — ABNORMAL LOW
UIBC: 220

## 2010-12-13 LAB — RETICULOCYTES: Retic Ct Pct: 1.9

## 2010-12-13 LAB — URINE CULTURE: Culture: NO GROWTH

## 2010-12-13 LAB — HEMOGLOBIN A1C: Mean Plasma Glucose: 133

## 2010-12-13 LAB — CROSSMATCH: Donor AG Type: NEGATIVE

## 2010-12-13 LAB — URINE MICROSCOPIC-ADD ON

## 2010-12-13 LAB — TSH: TSH: 4.158

## 2010-12-13 LAB — HEMOGLOBIN AND HEMATOCRIT, BLOOD: HCT: 22.9 — ABNORMAL LOW

## 2010-12-14 LAB — CBC
HCT: 36.2
MCHC: 34.3
MCV: 96.8
RBC: 3.74 — ABNORMAL LOW

## 2010-12-14 LAB — URINALYSIS, ROUTINE W REFLEX MICROSCOPIC
Ketones, ur: NEGATIVE
Nitrite: NEGATIVE
Protein, ur: NEGATIVE
Urobilinogen, UA: 0.2

## 2010-12-14 LAB — COMPREHENSIVE METABOLIC PANEL
AST: 21
BUN: 16
CO2: 28
Calcium: 9.3
Creatinine, Ser: 0.96
GFR calc Af Amer: >60
GFR calc non Af Amer: 55 — ABNORMAL LOW

## 2010-12-14 LAB — CROSSMATCH
ABO/RH(D): A POS
Donor AG Type: NEGATIVE

## 2010-12-14 LAB — URINE CULTURE: Culture: NO GROWTH

## 2010-12-14 LAB — DIFFERENTIAL
Basophils Absolute: 0
Eosinophils Relative: 2
Lymphocytes Relative: 24
Lymphs Abs: 2
Neutro Abs: 5.5
Neutrophils Relative %: 65

## 2010-12-14 LAB — APTT: aPTT: 30

## 2010-12-14 LAB — PROTIME-INR
INR: 1
Prothrombin Time: 13

## 2010-12-14 LAB — URINE MICROSCOPIC-ADD ON

## 2011-07-28 ENCOUNTER — Encounter (HOSPITAL_COMMUNITY): Payer: Self-pay | Admitting: *Deleted

## 2011-07-28 ENCOUNTER — Emergency Department (HOSPITAL_COMMUNITY): Payer: Medicare Other

## 2011-07-28 ENCOUNTER — Observation Stay (HOSPITAL_COMMUNITY)
Admission: EM | Admit: 2011-07-28 | Discharge: 2011-07-30 | Disposition: A | Discharge status: Home / Self Care | Payer: Medicare Other | Attending: Internal Medicine | Admitting: Internal Medicine

## 2011-07-28 ENCOUNTER — Inpatient Hospital Stay (HOSPITAL_COMMUNITY): Payer: Medicare Other

## 2011-07-28 DIAGNOSIS — IMO0002 Reserved for concepts with insufficient information to code with codable children: Secondary | ICD-10-CM

## 2011-07-28 DIAGNOSIS — F3289 Other specified depressive episodes: Secondary | ICD-10-CM | POA: Insufficient documentation

## 2011-07-28 DIAGNOSIS — R269 Unspecified abnormalities of gait and mobility: Secondary | ICD-10-CM | POA: Insufficient documentation

## 2011-07-28 DIAGNOSIS — G319 Degenerative disease of nervous system, unspecified: Secondary | ICD-10-CM | POA: Insufficient documentation

## 2011-07-28 DIAGNOSIS — K219 Gastro-esophageal reflux disease without esophagitis: Secondary | ICD-10-CM | POA: Insufficient documentation

## 2011-07-28 DIAGNOSIS — I10 Essential (primary) hypertension: Secondary | ICD-10-CM | POA: Diagnosis present

## 2011-07-28 DIAGNOSIS — S22009A Unspecified fracture of unspecified thoracic vertebra, initial encounter for closed fracture: Principal | ICD-10-CM | POA: Insufficient documentation

## 2011-07-28 DIAGNOSIS — Z79899 Other long term (current) drug therapy: Secondary | ICD-10-CM | POA: Insufficient documentation

## 2011-07-28 DIAGNOSIS — E119 Type 2 diabetes mellitus without complications: Secondary | ICD-10-CM | POA: Diagnosis present

## 2011-07-28 DIAGNOSIS — R296 Repeated falls: Secondary | ICD-10-CM | POA: Insufficient documentation

## 2011-07-28 DIAGNOSIS — E039 Hypothyroidism, unspecified: Secondary | ICD-10-CM | POA: Insufficient documentation

## 2011-07-28 DIAGNOSIS — S22000A Wedge compression fracture of unspecified thoracic vertebra, initial encounter for closed fracture: Secondary | ICD-10-CM

## 2011-07-28 DIAGNOSIS — F329 Major depressive disorder, single episode, unspecified: Secondary | ICD-10-CM | POA: Insufficient documentation

## 2011-07-28 DIAGNOSIS — M545 Low back pain: Secondary | ICD-10-CM

## 2011-07-28 HISTORY — DX: Urinary tract infection, site not specified: N39.0

## 2011-07-28 HISTORY — DX: Hypothyroidism, unspecified: E03.9

## 2011-07-28 HISTORY — DX: Unspecified osteoarthritis, unspecified site: M19.90

## 2011-07-28 HISTORY — DX: Reserved for inherently not codable concepts without codable children: IMO0001

## 2011-07-28 HISTORY — DX: Major depressive disorder, single episode, unspecified: F32.9

## 2011-07-28 HISTORY — DX: Depression, unspecified: F32.A

## 2011-07-28 HISTORY — DX: Encounter for other specified aftercare: Z51.89

## 2011-07-28 HISTORY — DX: Personal history of other diseases of the digestive system: Z87.19

## 2011-07-28 HISTORY — DX: Disorder of thyroid, unspecified: E07.9

## 2011-07-28 HISTORY — DX: Pneumonia, unspecified organism: J18.9

## 2011-07-28 HISTORY — DX: Gastro-esophageal reflux disease without esophagitis: K21.9

## 2011-07-28 HISTORY — DX: Malignant (primary) neoplasm, unspecified: C80.1

## 2011-07-28 HISTORY — DX: Essential (primary) hypertension: I10

## 2011-07-28 LAB — POCT I-STAT, CHEM 8
BUN: 22 mg/dL (ref 6–23)
Chloride: 105 mEq/L (ref 96–112)
Creatinine, Ser: 0.7 mg/dL (ref 0.50–1.10)
Sodium: 141 mEq/L (ref 135–145)
TCO2: 25 mmol/L (ref 0–100)

## 2011-07-28 LAB — URINALYSIS, ROUTINE W REFLEX MICROSCOPIC
Bilirubin Urine: NEGATIVE
Hgb urine dipstick: NEGATIVE
Ketones, ur: NEGATIVE mg/dL
Nitrite: NEGATIVE
Specific Gravity, Urine: 1.016 (ref 1.005–1.030)
pH: 7 (ref 5.0–8.0)

## 2011-07-28 LAB — URINE MICROSCOPIC-ADD ON

## 2011-07-28 LAB — GLUCOSE, CAPILLARY: Glucose-Capillary: 84 mg/dL (ref 70–99)

## 2011-07-28 MED ORDER — OXYCODONE-ACETAMINOPHEN 5-325 MG PO TABS
1.0000 | ORAL_TABLET | ORAL | Status: DC | PRN
  Administered 2011-07-29: 1 via ORAL
  Filled 2011-07-28: qty 1

## 2011-07-28 MED ORDER — METOPROLOL TARTRATE 50 MG PO TABS
50.0000 mg | ORAL_TABLET | Freq: Every day | ORAL | Status: DC
Start: 2011-07-28 — End: 2011-07-30
  Administered 2011-07-29 – 2011-07-30 (×2): 50 mg via ORAL
  Filled 2011-07-28 (×2): qty 1

## 2011-07-28 MED ORDER — NITROFURANTOIN MONOHYD MACRO 100 MG PO CAPS: 100.0000 mg | ORAL_CAPSULE | Freq: Every day | ORAL | Status: DC

## 2011-07-28 MED ORDER — POLYETHYLENE GLYCOL 3350 17 G PO PACK
17.0000 g | PACK | Freq: Every day | ORAL | Status: DC
  Administered 2011-07-29 – 2011-07-30 (×2): 17 g via ORAL
  Filled 2011-07-28: qty 1

## 2011-07-28 MED ORDER — DULOXETINE HCL 60 MG PO CPEP
60.0000 mg | ORAL_CAPSULE | Freq: Every day | ORAL | Status: DC
  Administered 2011-07-28 – 2011-07-30 (×3): 60 mg via ORAL
  Filled 2011-07-28 (×3): qty 1

## 2011-07-28 MED ORDER — ONDANSETRON HCL 4 MG/2ML IJ SOLN: 4.0000 mg | Freq: Four times a day (QID) | INTRAMUSCULAR | Status: DC | PRN

## 2011-07-28 MED ORDER — NAPROXEN 375 MG PO TABS
375.0000 mg | ORAL_TABLET | Freq: Two times a day (BID) | ORAL | Status: DC
  Administered 2011-07-28 – 2011-07-30 (×5): 375 mg via ORAL
  Filled 2011-07-28 (×7): qty 1

## 2011-07-28 MED ORDER — ONDANSETRON HCL 4 MG PO TABS: 4.0000 mg | ORAL_TABLET | Freq: Four times a day (QID) | ORAL | Status: DC | PRN

## 2011-07-28 MED ORDER — ASPIRIN 81 MG PO CHEW
81.0000 mg | CHEWABLE_TABLET | Freq: Every day | ORAL | Status: DC
  Administered 2011-07-28 – 2011-07-30 (×3): 81 mg via ORAL
  Filled 2011-07-28 (×3): qty 1

## 2011-07-28 MED ORDER — AMLODIPINE BESYLATE 5 MG PO TABS
5.0000 mg | ORAL_TABLET | Freq: Every day | ORAL | Status: DC
  Administered 2011-07-28 – 2011-07-30 (×3): 5 mg via ORAL
  Filled 2011-07-28 (×3): qty 1

## 2011-07-28 MED ORDER — LORAZEPAM 2 MG/ML IJ SOLN
1.0000 mg | Freq: Once | INTRAMUSCULAR | Status: AC
  Administered 2011-07-28: 1 mg via INTRAVENOUS
  Filled 2011-07-28 (×2): qty 1

## 2011-07-28 MED ORDER — OXYCODONE-ACETAMINOPHEN 5-325 MG PO TABS
1.0000 | ORAL_TABLET | Freq: Once | ORAL | Status: AC
  Administered 2011-07-28: 1 via ORAL
  Filled 2011-07-28: qty 1

## 2011-07-28 MED ORDER — LEVOTHYROXINE SODIUM 50 MCG PO TABS
50.0000 ug | ORAL_TABLET | Freq: Every day | ORAL | Status: DC
  Administered 2011-07-28 – 2011-07-30 (×3): 50 ug via ORAL
  Filled 2011-07-28 (×3): qty 1

## 2011-07-28 MED ORDER — ACETAMINOPHEN 650 MG RE SUPP: 650.0000 mg | Freq: Four times a day (QID) | RECTAL | Status: DC | PRN

## 2011-07-28 MED ORDER — PANTOPRAZOLE SODIUM 40 MG PO TBEC
40.0000 mg | DELAYED_RELEASE_TABLET | Freq: Every day | ORAL | Status: DC
  Administered 2011-07-29 – 2011-07-30 (×2): 40 mg via ORAL
  Filled 2011-07-28 (×2): qty 1

## 2011-07-28 MED ORDER — ACETAMINOPHEN 325 MG PO TABS: 650.0000 mg | ORAL_TABLET | Freq: Four times a day (QID) | ORAL | Status: DC | PRN

## 2011-07-28 MED ORDER — HYDROMORPHONE HCL PF 1 MG/ML IJ SOLN: 0.5000 mg | INTRAMUSCULAR | Status: DC | PRN

## 2011-07-28 MED ORDER — SODIUM CHLORIDE 0.9 % IV SOLN
INTRAVENOUS | Status: DC
  Administered 2011-07-28: 1000 mL via INTRAVENOUS
  Administered 2011-07-29: 07:00:00 via INTRAVENOUS

## 2011-07-28 NOTE — ED Notes (Signed)
Per EMS - pt comes from home where she lives alone.  Pt lost her balance while using her walker this morning and fell, landing on her back.  Pt c/o right lower lumbar pain, right posterior hip pain, right knee pain and right chest pain.  No crepitus or deformity noted.  Pt has good breath sounds.  Pt MAE.  Vitals WNL.

## 2011-07-28 NOTE — ED Notes (Signed)
Daughter- Marinus Maw [facility] Call with any questions/ concerns

## 2011-07-28 NOTE — Consult Note (Signed)
Reason for Consult: T6 and T9 compression fracture Referring Physician: [facility] hospitalist Dr. Levora Dredge Jodi Price is an 78 y.o. female.  HPI: Patient is a 78 year old female who fell this morning falling backward landing on her back. His back pain and spasm across her back to her buttock but denies any pain into her legs denies any numbness tingling or legs or feet. Patient brought to the [facility] ER workup revealed a mild T6 and slightly worse T9 compression deformity the T6 fracture was consistent with a compression fracture T9 was a mild burst fracture. Patient is a very neurologically intact was being admitted to medicine and we have been asked to consult.  Past Medical History  Diagnosis Date  . Hypertension   . Thyroid disease   . Acid reflux disease   . Depression   . Diabetes mellitus   . Urinary tract infection   . Hypothyroidism   . Blood transfusion     hx reaction  . GERD (gastroesophageal reflux disease)     gastrophoresis  . H/O hiatal hernia   . Cancer   . Arthritis   . Pneumonia     Past Surgical History  Procedure Date  . Joint replacement   . Breast surgery   . Back surgery     1950  . Eye surgery     cataract    No family history on file.  Social History:  reports that she has never smoked. She has never used smokeless tobacco. She reports that she does not drink alcohol or use illicit drugs.  Allergies:  Allergies  Allergen Reactions  . Influenza Vaccines Anaphylaxis  . Pneumococcal Vaccines Anaphylaxis    Medications: I have reviewed the patient's current medications.  Results for orders placed during the hospital encounter of 07/28/11 (from the past 48 hour(s))  URINALYSIS, ROUTINE W REFLEX MICROSCOPIC     Status: Abnormal   Collection Time   07/28/11 11:58 AM      Component Value Range Comment   Color, Urine YELLOW  YELLOW     APPearance CLEAR  CLEAR     Specific Gravity, Urine 1.016  1.005 - 1.030     pH 7.0  5.0 - 8.0     Glucose,  UA NEGATIVE  NEGATIVE (mg/dL)    Hgb urine dipstick NEGATIVE  NEGATIVE     Bilirubin Urine NEGATIVE  NEGATIVE     Ketones, ur NEGATIVE  NEGATIVE (mg/dL)    Protein, ur 30 (*) NEGATIVE (mg/dL)    Urobilinogen, UA 1.0  0.0 - 1.0 (mg/dL)    Nitrite NEGATIVE  NEGATIVE     Leukocytes, UA SMALL (*) NEGATIVE    URINE MICROSCOPIC-ADD ON     Status: Normal   Collection Time   07/28/11 11:58 AM      Component Value Range Comment   Squamous Epithelial / LPF RARE  RARE     WBC, UA 0-2  <3 (WBC/hpf)    Bacteria, UA RARE  RARE     Urine-Other MUCOUS PRESENT     POCT I-STAT, CHEM 8     Status: Abnormal   Collection Time   07/28/11 12:11 PM      Component Value Range Comment   Sodium 141  135 - 145 (mEq/L)    Potassium 3.4 (*) 3.5 - 5.1 (mEq/L)    Chloride 105  96 - 112 (mEq/L)    BUN 22  6 - 23 (mg/dL)    Creatinine, Ser 8.11  0.50 - 1.10 (  mg/dL)    Glucose, Bld 454 (*) 70 - 99 (mg/dL)    Calcium, Ion 0.98  1.12 - 1.32 (mmol/L)    TCO2 25  0 - 100 (mmol/L)    Hemoglobin 13.9  12.0 - 15.0 (g/dL)    HCT 11.9  14.7 - 82.9 (%)   GLUCOSE, CAPILLARY     Status: Normal   Collection Time   07/28/11  3:27 PM      Component Value Range Comment   Glucose-Capillary 84  70 - 99 (mg/dL)     Ct Head Wo Contrast  07/28/2011  *RADIOLOGY REPORT*  Clinical Data:  Fall.  Hit her head.  No prior stroke.  Seizures. Pain all over.  Back pain.  CT HEAD WITHOUT CONTRAST CT CERVICAL SPINE WITHOUT CONTRAST CT THORACIC SPINE WITHOUT CONTRAST  Technique:  Multidetector CT imaging of the head and cervical spine was performed following the standard protocol without intravenous contrast.  Multiplanar CT image reconstructions of the cervical spine were also generated.  Comparison:  None  CT HEAD  Findings: There is moderate central cortical atrophy.  Moderate periventricular white matter changes are consistent with small vessel disease. There is no evidence for hemorrhage, mass lesion, or acute infarction.  Bone windows show  atherosclerotic calcification of the internal carotid arteries.  No calvarial fracture.  IMPRESSION:  1.  Atrophy and small vessel disease. 2. No evidence for acute intracranial abnormality.  CT CERVICAL SPINE  Findings: There are moderate degenerative changes throughout cervical spine.  These are identified at all levels. There is loss of cervical lordosis.  This may be secondary to splinting, soft tissue injury, or positioning.  Curvature is felt to be more likely related to degenerative changes.  There is retrolisthesis of C4 on C5, approximately 2 mm, also felt to be degenerative.  No evidence for acute fracture.  Prevertebral soft tissues have a normal appearance.  There is calcification within the left carotid bulb. Lung apices are clear.  IMPRESSION:  1.  Significant degenerative changes throughout cervical spine. 2. No evidence for acute  abnormality.  CT THORACIC SPINE  Technique:  Multidetector CT imaging of the thoracic spine was performed without intravenous contrast administration.  Multiplanar CT image reconstructions were also generated.  Findings:  There is extensive degenerative change throughout the thoracic spine.  There is an acute wedge compression fracture of T6 with approximately 20% loss of anterior height.  Fracture does not appear to extend into the posterior elements.  However the fracture does involve the entire vertebral body and there may be further compression given the patient's kyphosis.  There is an acute compression fracture of T12 with approximately 50% loss of anterior height.  Fracture does not appear to extend into the posterior elements but there is some narrowing of the canal as a result of the fracture.  Further evaluation with MRI may be helpful.  There are multiple old posterior rib fractures.  No definite acute rib fractures.  IMPRESSION:  1.  Acute fractures of T6 and T12 as described.  There is posterior impingement upon the canal at T12 as a result of posterior  buckling of the vertebral body.  Further evaluation with MRI is recommended. 2.  The fracture of T6 involves the entire vertebral body but not the posterior elements.  The patient is at risk for further loss of vertebral height at this level.  Original Report Authenticated By: Patterson Hammersmith, M.D.   Ct Cervical Spine Wo Contrast  07/28/2011  *RADIOLOGY REPORT*  Clinical Data:  Fall.  Hit her head.  No prior stroke.  Seizures. Pain all over.  Back pain.  CT HEAD WITHOUT CONTRAST CT CERVICAL SPINE WITHOUT CONTRAST CT THORACIC SPINE WITHOUT CONTRAST  Technique:  Multidetector CT imaging of the head and cervical spine was performed following the standard protocol without intravenous contrast.  Multiplanar CT image reconstructions of the cervical spine were also generated.  Comparison:  None  CT HEAD  Findings: There is moderate central cortical atrophy.  Moderate periventricular white matter changes are consistent with small vessel disease. There is no evidence for hemorrhage, mass lesion, or acute infarction.  Bone windows show atherosclerotic calcification of the internal carotid arteries.  No calvarial fracture.  IMPRESSION:  1.  Atrophy and small vessel disease. 2. No evidence for acute intracranial abnormality.  CT CERVICAL SPINE  Findings: There are moderate degenerative changes throughout cervical spine.  These are identified at all levels. There is loss of cervical lordosis.  This may be secondary to splinting, soft tissue injury, or positioning.  Curvature is felt to be more likely related to degenerative changes.  There is retrolisthesis of C4 on C5, approximately 2 mm, also felt to be degenerative.  No evidence for acute fracture.  Prevertebral soft tissues have a normal appearance.  There is calcification within the left carotid bulb. Lung apices are clear.  IMPRESSION:  1.  Significant degenerative changes throughout cervical spine. 2. No evidence for acute  abnormality.  CT THORACIC SPINE   Technique:  Multidetector CT imaging of the thoracic spine was performed without intravenous contrast administration.  Multiplanar CT image reconstructions were also generated.  Findings:  There is extensive degenerative change throughout the thoracic spine.  There is an acute wedge compression fracture of T6 with approximately 20% loss of anterior height.  Fracture does not appear to extend into the posterior elements.  However the fracture does involve the entire vertebral body and there may be further compression given the patient's kyphosis.  There is an acute compression fracture of T12 with approximately 50% loss of anterior height.  Fracture does not appear to extend into the posterior elements but there is some narrowing of the canal as a result of the fracture.  Further evaluation with MRI may be helpful.  There are multiple old posterior rib fractures.  No definite acute rib fractures.  IMPRESSION:  1.  Acute fractures of T6 and T12 as described.  There is posterior impingement upon the canal at T12 as a result of posterior buckling of the vertebral body.  Further evaluation with MRI is recommended. 2.  The fracture of T6 involves the entire vertebral body but not the posterior elements.  The patient is at risk for further loss of vertebral height at this level.  Original Report Authenticated By: Patterson Hammersmith, M.D.   Ct Thoracic Spine Wo Contrast  07/28/2011  *RADIOLOGY REPORT*  Clinical Data:  Fall.  Hit her head.  No prior stroke.  Seizures. Pain all over.  Back pain.  CT HEAD WITHOUT CONTRAST CT CERVICAL SPINE WITHOUT CONTRAST CT THORACIC SPINE WITHOUT CONTRAST  Technique:  Multidetector CT imaging of the head and cervical spine was performed following the standard protocol without intravenous contrast.  Multiplanar CT image reconstructions of the cervical spine were also generated.  Comparison:  None  CT HEAD  Findings: There is moderate central cortical atrophy.  Moderate periventricular  white matter changes are consistent with small vessel disease. There is no evidence for hemorrhage, mass lesion, or  acute infarction.  Bone windows show atherosclerotic calcification of the internal carotid arteries.  No calvarial fracture.  IMPRESSION:  1.  Atrophy and small vessel disease. 2. No evidence for acute intracranial abnormality.  CT CERVICAL SPINE  Findings: There are moderate degenerative changes throughout cervical spine.  These are identified at all levels. There is loss of cervical lordosis.  This may be secondary to splinting, soft tissue injury, or positioning.  Curvature is felt to be more likely related to degenerative changes.  There is retrolisthesis of C4 on C5, approximately 2 mm, also felt to be degenerative.  No evidence for acute fracture.  Prevertebral soft tissues have a normal appearance.  There is calcification within the left carotid bulb. Lung apices are clear.  IMPRESSION:  1.  Significant degenerative changes throughout cervical spine. 2. No evidence for acute  abnormality.  CT THORACIC SPINE  Technique:  Multidetector CT imaging of the thoracic spine was performed without intravenous contrast administration.  Multiplanar CT image reconstructions were also generated.  Findings:  There is extensive degenerative change throughout the thoracic spine.  There is an acute wedge compression fracture of T6 with approximately 20% loss of anterior height.  Fracture does not appear to extend into the posterior elements.  However the fracture does involve the entire vertebral body and there may be further compression given the patient's kyphosis.  There is an acute compression fracture of T12 with approximately 50% loss of anterior height.  Fracture does not appear to extend into the posterior elements but there is some narrowing of the canal as a result of the fracture.  Further evaluation with MRI may be helpful.  There are multiple old posterior rib fractures.  No definite acute rib  fractures.  IMPRESSION:  1.  Acute fractures of T6 and T12 as described.  There is posterior impingement upon the canal at T12 as a result of posterior buckling of the vertebral body.  Further evaluation with MRI is recommended. 2.  The fracture of T6 involves the entire vertebral body but not the posterior elements.  The patient is at risk for further loss of vertebral height at this level.  Original Report Authenticated By: Patterson Hammersmith, M.D.    @ROS @ Blood pressure 183/76, pulse 67, temperature 97.5 F (36.4 C), temperature source Oral, resp. rate 16, height 4\' 11"  (1.499 m), weight 54.885 kg (121 lb), SpO2 97.00%. Patient is awake alert and oriented her strength is 5 out of 5 in her iliopsoas, quads, hamstrings, gastrocs, anterior tibialis, and EHL. Sensation appears be grossly intact to light touch and proprioception.  Assessment/Plan: 78 year old female with compression fracture T6 mild burst fracture T9 significant osteopenia on both CT and MRI I would recommend bracing at T12 burst fracture mobilize the patient for physical occupational therapy if she tolerates mobilization in a brace no additional intervention will be needed. If she has very difficult pain on mobilization consideration can be given to vertebroplasty and would recommend consulting interventional radiology. In a patient her age and comorbidities and significant osteophyte presents osteopenia there is no surgical intervention needed.  Kimbery Harwood P 07/28/2011, 4:58 PM

## 2011-07-28 NOTE — H&P (Addendum)
PCP: Martin Majestic  No primary provider on file.   Chief Complaint:  Fall/Back pain  HPI: Jodi Price is a very pleasant 89/F history of diabetes, hypertension, hypothyroidism, gait disorder was in her usual state of health. Today while fixing her hair in the bathroom she recalls turning around and then hit something and falling backwards. She recalls hitting the right side of her back and head. Denies any presyncopal symptoms denies chest pain dizziness prior to fall. Subsequently started having severe pain in the mid back worsened by turning, denies any weakness or numbness in either lower extremities subsequently was brought to the emergency room where she was noted to have T6 and T12 compression fractures.  Allergies:   Allergies  Allergen Reactions  . Influenza Vaccines Anaphylaxis  . Pneumococcal Vaccines Anaphylaxis      Past Medical History  Diagnosis Date  . Hypertension   . Thyroid disease   . Acid reflux disease   . Depression   . Diabetes mellitus   . Urinary tract infection     Past Surgical History  Procedure Date  . Breast surgery     Prior to Admission medications   Medication Sig Start Date End Date Taking? Authorizing Provider  amLODipine (NORVASC) 5 MG tablet Take 5 mg by mouth daily.   Yes Historical Provider, MD  aspirin 81 MG tablet Take 81 mg by mouth daily.   Yes Historical Provider, MD  DULoxetine (CYMBALTA) 60 MG capsule Take 60 mg by mouth daily.   Yes Historical Provider, MD  levothyroxine (SYNTHROID, LEVOTHROID) 50 MCG tablet Take 50 mcg by mouth daily.   Yes Historical Provider, MD  metoprolol (LOPRESSOR) 50 MG tablet Take 50 mg by mouth daily.   Yes Historical Provider, MD  nitrofurantoin, macrocrystal-monohydrate, (MACROBID) 100 MG capsule Take 100 mg by mouth daily.   Yes Historical Provider, MD  omeprazole (PRILOSEC) 40 MG capsule Take 40 mg by mouth daily.   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  Review of Systems:  Constitutional: Denies fever, chills, diaphoresis, appetite change and fatigue.  HEENT: Denies photophobia, eye pain, redness, hearing loss, ear pain, congestion, sore throat, rhinorrhea, sneezing, mouth sores, trouble swallowing, neck pain, neck stiffness and tinnitus.   Respiratory: Denies SOB, DOE, cough, chest tightness,  and wheezing.   Cardiovascular: Denies chest pain, palpitations and leg swelling.  Gastrointestinal: Denies nausea, vomiting, abdominal pain, diarrhea, constipation, blood in stool and abdominal distention.  Genitourinary: Denies dysuria, urgency, frequency, hematuria, flank pain and difficulty urinating.  Musculoskeletal: Denies myalgias, back pain, joint swelling, arthralgias and gait problem.  Skin: Denies pallor, rash and wound.  Neurological: Denies dizziness, seizures, syncope, weakness, light-headedness, numbness and headaches.  Hematological: Denies adenopathy. Easy bruising, personal or family bleeding history  Psychiatric/Behavioral: Denies suicidal ideation, mood changes, confusion, nervousness, sleep disturbance and agitation   Physical Exam: Blood pressure 148/75, pulse 67, temperature 98.5 F (36.9 C), temperature source Oral, resp. rate 16, SpO2 98.00%. Gen: AAO x3 HEENT: PERRLA, EOMI, oral mucosa moist/pink CVS : S1S2/RRR Lungs: Clear Abd: Soft nondistended nontender normal bowel sounds organomegaly no flank tenderness Extremities no edema clubbing or cyanosis Neuro motor strength is 5 x 5 in all extremities proximal and distal muscle groups, Sensory light touch intact in both lower extremities Cranial nerves II through XII grossly intact Gait not assessed Coordination not assessed Reflexes 1+  bilaterally, plantars withdrawal Labs on Admission:  Results for orders placed during the hospital encounter of 07/28/11 (from the past 48  hour(s))  URINALYSIS, ROUTINE W REFLEX MICROSCOPIC     Status: Abnormal   Collection Time   07/28/11 11:58 AM      Component Value Range Comment   Color, Urine YELLOW  YELLOW     APPearance CLEAR  CLEAR     Specific Gravity, Urine 1.016  1.005 - 1.030     pH 7.0  5.0 - 8.0     Glucose, UA NEGATIVE  NEGATIVE (mg/dL)    Hgb urine dipstick NEGATIVE  NEGATIVE     Bilirubin Urine NEGATIVE  NEGATIVE     Ketones, ur NEGATIVE  NEGATIVE (mg/dL)    Protein, ur 30 (*) NEGATIVE (mg/dL)    Urobilinogen, UA 1.0  0.0 - 1.0 (mg/dL)    Nitrite NEGATIVE  NEGATIVE     Leukocytes, UA SMALL (*) NEGATIVE    URINE MICROSCOPIC-ADD ON     Status: Normal   Collection Time   07/28/11 11:58 AM      Component Value Range Comment   Squamous Epithelial / LPF RARE  RARE     WBC, UA 0-2  <3 (WBC/hpf)    Bacteria, UA RARE  RARE     Urine-Other MUCOUS PRESENT     POCT I-STAT, CHEM 8     Status: Abnormal   Collection Time   07/28/11 12:11 PM      Component Value Range Comment   Sodium 141  135 - 145 (mEq/L)    Potassium 3.4 (*) 3.5 - 5.1 (mEq/L)    Chloride 105  96 - 112 (mEq/L)    BUN 22  6 - 23 (mg/dL)    Creatinine, Ser 1.61  0.50 - 1.10 (mg/dL)    Glucose, Bld 096 (*) 70 - 99 (mg/dL)    Calcium, Ion 0.45  1.12 - 1.32 (mmol/L)    TCO2 25  0 - 100 (mmol/L)    Hemoglobin 13.9  12.0 - 15.0 (g/dL)    HCT 40.9  81.1 - 91.4 (%)     Radiological Exams on Admission: Ct Head Wo Contrast  07/28/2011  *RADIOLOGY REPORT*  Clinical Data:  Fall.  Hit her head.  No prior stroke.  Seizures. Pain all over.  Back pain.  CT HEAD WITHOUT CONTRAST CT CERVICAL SPINE WITHOUT CONTRAST CT THORACIC SPINE WITHOUT CONTRAST  Technique:  Multidetector CT imaging of the head and cervical spine was performed following the standard protocol without intravenous contrast.  Multiplanar CT image reconstructions of the cervical spine were also generated.  Comparison:  None  CT HEAD  Findings: There is moderate central cortical atrophy.  Moderate  periventricular white matter changes are consistent with small vessel disease. There is no evidence for hemorrhage, mass lesion, or acute infarction.  Bone windows show atherosclerotic calcification of the internal carotid arteries.  No calvarial fracture.  IMPRESSION:  1.  Atrophy and small vessel disease. 2. No evidence for acute intracranial abnormality.  CT CERVICAL SPINE  Findings: There are moderate degenerative changes throughout cervical spine.  These are identified at all levels. There is loss of cervical lordosis.  This may be secondary to splinting, soft tissue injury, or positioning.  Curvature is felt to be more likely related to degenerative changes.  There is retrolisthesis of C4 on C5, approximately 2 mm, also felt to be degenerative.  No evidence for acute fracture.  Prevertebral soft tissues have a normal appearance.  There is calcification within the left carotid bulb. Lung apices are clear.  IMPRESSION:  1.  Significant degenerative changes throughout cervical spine. 2. No evidence for acute  abnormality.  CT THORACIC SPINE  Technique:  Multidetector CT imaging of the thoracic spine was performed without intravenous contrast administration.  Multiplanar CT image reconstructions were also generated.  Findings:  There is extensive degenerative change throughout the thoracic spine.  There is an acute wedge compression fracture of T6 with approximately 20% loss of anterior height.  Fracture does not appear to extend into the posterior elements.  However the fracture does involve the entire vertebral body and there may be further compression given the patient's kyphosis.  There is an acute compression fracture of T12 with approximately 50% loss of anterior height.  Fracture does not appear to extend into the posterior elements but there is some narrowing of the canal as a result of the fracture.  Further evaluation with MRI may be helpful.  There are multiple old posterior rib fractures.  No definite  acute rib fractures.  IMPRESSION:  1.  Acute fractures of T6 and T12 as described.  There is posterior impingement upon the canal at T12 as a result of posterior buckling of the vertebral body.  Further evaluation with MRI is recommended. 2.  The fracture of T6 involves the entire vertebral body but not the posterior elements.  The patient is at risk for further loss of vertebral height at this level.  Original Report Authenticated By: Patterson Hammersmith, M.D.   Ct Cervical Spine Wo Contrast  07/28/2011  *RADIOLOGY REPORT*  Clinical Data:  Fall.  Hit her head.  No prior stroke.  Seizures. Pain all over.  Back pain.  CT HEAD WITHOUT CONTRAST CT CERVICAL SPINE WITHOUT CONTRAST CT THORACIC SPINE WITHOUT CONTRAST  Technique:  Multidetector CT imaging of the head and cervical spine was performed following the standard protocol without intravenous contrast.  Multiplanar CT image reconstructions of the cervical spine were also generated.  Comparison:  None  CT HEAD  Findings: There is moderate central cortical atrophy.  Moderate periventricular white matter changes are consistent with small vessel disease. There is no evidence for hemorrhage, mass lesion, or acute infarction.  Bone windows show atherosclerotic calcification of the internal carotid arteries.  No calvarial fracture.  IMPRESSION:  1.  Atrophy and small vessel disease. 2. No evidence for acute intracranial abnormality.  CT CERVICAL SPINE  Findings: There are moderate degenerative changes throughout cervical spine.  These are identified at all levels. There is loss of cervical lordosis.  This may be secondary to splinting, soft tissue injury, or positioning.  Curvature is felt to be more likely related to degenerative changes.  There is retrolisthesis of C4 on C5, approximately 2 mm, also felt to be degenerative.  No evidence for acute fracture.  Prevertebral soft tissues have a normal appearance.  There is calcification within the left carotid bulb. Lung  apices are clear.  IMPRESSION:  1.  Significant degenerative changes throughout cervical spine. 2. No evidence for acute  abnormality.  CT THORACIC SPINE  Technique:  Multidetector CT imaging of the thoracic spine was performed without intravenous contrast administration.  Multiplanar CT image reconstructions were also generated.  Findings:  There is extensive degenerative change throughout the thoracic spine.  There is an acute wedge compression fracture of T6 with approximately 20% loss of anterior height.  Fracture does not appear to extend into the posterior elements.  However the fracture does involve  the entire vertebral body and there may be further compression given the patient's kyphosis.  There is an acute compression fracture of T12 with approximately 50% loss of anterior height.  Fracture does not appear to extend into the posterior elements but there is some narrowing of the canal as a result of the fracture.  Further evaluation with MRI may be helpful.  There are multiple old posterior rib fractures.  No definite acute rib fractures.  IMPRESSION:  1.  Acute fractures of T6 and T12 as described.  There is posterior impingement upon the canal at T12 as a result of posterior buckling of the vertebral body.  Further evaluation with MRI is recommended. 2.  The fracture of T6 involves the entire vertebral body but not the posterior elements.  The patient is at risk for further loss of vertebral height at this level.  Original Report Authenticated By: Patterson Hammersmith, M.D.   Ct Thoracic Spine Wo Contrast  07/28/2011  *RADIOLOGY REPORT*  Clinical Data:  Fall.  Hit her head.  No prior stroke.  Seizures. Pain all over.  Back pain.  CT HEAD WITHOUT CONTRAST CT CERVICAL SPINE WITHOUT CONTRAST CT THORACIC SPINE WITHOUT CONTRAST  Technique:  Multidetector CT imaging of the head and cervical spine was performed following the standard protocol without intravenous contrast.  Multiplanar CT image reconstructions  of the cervical spine were also generated.  Comparison:  None  CT HEAD  Findings: There is moderate central cortical atrophy.  Moderate periventricular white matter changes are consistent with small vessel disease. There is no evidence for hemorrhage, mass lesion, or acute infarction.  Bone windows show atherosclerotic calcification of the internal carotid arteries.  No calvarial fracture.  IMPRESSION:  1.  Atrophy and small vessel disease. 2. No evidence for acute intracranial abnormality.  CT CERVICAL SPINE  Findings: There are moderate degenerative changes throughout cervical spine.  These are identified at all levels. There is loss of cervical lordosis.  This may be secondary to splinting, soft tissue injury, or positioning.  Curvature is felt to be more likely related to degenerative changes.  There is retrolisthesis of C4 on C5, approximately 2 mm, also felt to be degenerative.  No evidence for acute fracture.  Prevertebral soft tissues have a normal appearance.  There is calcification within the left carotid bulb. Lung apices are clear.  IMPRESSION:  1.  Significant degenerative changes throughout cervical spine. 2. No evidence for acute  abnormality.  CT THORACIC SPINE  Technique:  Multidetector CT imaging of the thoracic spine was performed without intravenous contrast administration.  Multiplanar CT image reconstructions were also generated.  Findings:  There is extensive degenerative change throughout the thoracic spine.  There is an acute wedge compression fracture of T6 with approximately 20% loss of anterior height.  Fracture does not appear to extend into the posterior elements.  However the fracture does involve the entire vertebral body and there may be further compression given the patient's kyphosis.  There is an acute compression fracture of T12 with approximately 50% loss of anterior height.  Fracture does not appear to extend into the posterior elements but there is some narrowing of the canal  as a result of the fracture.  Further evaluation with MRI may be helpful.  There are multiple old posterior rib fractures.  No definite acute rib fractures.  IMPRESSION:  1.  Acute fractures of T6 and T12 as described.  There is posterior impingement upon the canal at T12 as a result of  posterior buckling of the vertebral body.  Further evaluation with MRI is recommended. 2.  The fracture of T6 involves the entire vertebral body but not the posterior elements.  The patient is at risk for further loss of vertebral height at this level.  Original Report Authenticated By: Patterson Hammersmith, M.D.    Assessment/Plan 1. Mechanical fall 2. T6/12 Vertebral Compression fracture 3. DM (diabetes mellitus) 4. HTN (hypertension) Plan: Admit to medical floor Get MRI thoracic spine Add naproxen, percocet PRN Will request Neurosurgery consultation Will continue other home medications Will request PT eval  Discussed code status: DNR  Time Spent on Admission:  Jodi Price [facility] Pager: [phone] 07/28/2011, 3:15 PM

## 2011-07-28 NOTE — ED Notes (Addendum)
Pt states she was at her bathroom vanity getting ready for church when she turned and got tangled up in her feet and fell.  Pt states she fell backwards, landing primarily on her right side.  Pt denies LOC and states when she fell she hit her head on a pile of clothes.  Pt c/o right shoulder, hip, flank and low back pain - worse with palpation.  Pt states flank pain is worse than other areas.  Pt has redness to right shoulder and flank, but no bruising is noted.  No deformity noted to right shoulder, arm, flank or hip.  Pt is A&O.  Pt denies dizziness now or prior to fall.  Pt denies chest pain, SOB and nausea.  Pt has good peripheral pulses in all 4 extremities.  Bowel sounds present and no pain to palpation of abdomen.  Pt's lung sounds are clear in all 4 lobes, but pt c/o increased pain to right flank with inspiration.

## 2011-07-28 NOTE — ED Notes (Signed)
Family at bedside.  Pt currently in xray.

## 2011-07-28 NOTE — ED Provider Notes (Addendum)
History     CSN: [id number]  Arrival date & time 07/28/11  1052   First MD Initiated Contact with Patient 07/28/11 1053      Chief Complaint  Patient presents with  . Fall    fell from a standing position - lost balance with walker    (Consider location/radiation/quality/duration/timing/severity/associated sxs/prior treatment) HPI Patient complaining of fall today. She states she fell in her bathroom. She was unable to get up. Pupils are soft the phone and called of fat R. friend. Sure arrived in the emergency department via ambulance. Complaining of pain in her mid back and upper back. Pain is sharp. It increases with movement. It is nonradiating. She has not had any treatment prior to arrival She denies any numbness or tingling. She did fall and strike her head does not think she lost consciousness. She is on aspirin. Past Medical History  Diagnosis Date  . Hypertension   . Thyroid disease   . Acid reflux disease   . Depression   . Diabetes mellitus     History reviewed. No pertinent past surgical history.  No family history on file.  History  Substance Use Topics  . Smoking status: Not on file  . Smokeless tobacco: Not on file  . Alcohol Use:     OB History    Grav Para Term Preterm Abortions TAB SAB Ect Mult Living                  Review of Systems  All other systems reviewed and are negative.    Allergies  Review of patient's allergies indicates no known allergies.  Home Medications   Current Outpatient Rx  Name Route Sig Dispense Refill  . AMLODIPINE BESYLATE 5 MG PO TABS Oral Take 5 mg by mouth daily.    . ASPIRIN 81 MG PO TABS Oral Take 81 mg by mouth daily.    . DULOXETINE HCL 60 MG PO CPEP Oral Take 60 mg by mouth daily.    Marland Kitchen LEVOTHYROXINE SODIUM 50 MCG PO TABS Oral Take 50 mcg by mouth daily.    Marland Kitchen METOPROLOL TARTRATE 50 MG PO TABS Oral Take 50 mg by mouth daily.    Marland Kitchen NITROFURANTOIN MONOHYD MACRO 100 MG PO CAPS Oral Take 100 mg by mouth daily.     Marland Kitchen OMEPRAZOLE 40 MG PO CPDR Oral Take 40 mg by mouth daily.      BP 175/89  Pulse 76  Temp 98.5 F (36.9 C)  Resp 16  SpO2 98%  Physical Exam  Nursing note and vitals reviewed. Constitutional: She is oriented to person, place, and time. She appears well-developed and well-nourished.  HENT:  Head: Normocephalic and atraumatic.  Right Ear: External ear normal.  Left Ear: External ear normal.  Eyes: Conjunctivae and EOM are normal. Pupils are equal, round, and reactive to light.  Neck: Normal range of motion. Neck supple.       No tenderness palpated over cervical spine tenderness palpated over mid thoracic spine and lower thoracic spine the  Cardiovascular: Normal rate and regular rhythm.   Pulmonary/Chest: Effort normal and breath sounds normal.  Abdominal: Soft. Bowel sounds are normal.  Musculoskeletal: Normal range of motion.  Neurological: She is alert and oriented to person, place, and time. She has normal reflexes.  Skin: Skin is warm and dry.  Psychiatric: She has a normal mood and affect.    ED Course  Procedures (including critical care time)  Labs Reviewed  URINALYSIS, ROUTINE W REFLEX  MICROSCOPIC - Abnormal; Notable for the following:    Protein, ur 30 (*)    Leukocytes, UA SMALL (*)    All other components within normal limits  POCT I-STAT, CHEM 8 - Abnormal; Notable for the following:    Potassium 3.4 (*)    Glucose, Bld 121 (*)    All other components within normal limits  URINE MICROSCOPIC-ADD ON   Ct Head Wo Contrast  07/28/2011  *RADIOLOGY REPORT*  Clinical Data:  Fall.  Hit her head.  No prior stroke.  Seizures. Pain all over.  Back pain.  CT HEAD WITHOUT CONTRAST CT CERVICAL SPINE WITHOUT CONTRAST CT THORACIC SPINE WITHOUT CONTRAST  Technique:  Multidetector CT imaging of the head and cervical spine was performed following the standard protocol without intravenous contrast.  Multiplanar CT image reconstructions of the cervical spine were also generated.   Comparison:  None  CT HEAD  Findings: There is moderate central cortical atrophy.  Moderate periventricular white matter changes are consistent with small vessel disease. There is no evidence for hemorrhage, mass lesion, or acute infarction.  Bone windows show atherosclerotic calcification of the internal carotid arteries.  No calvarial fracture.  IMPRESSION:  1.  Atrophy and small vessel disease. 2. No evidence for acute intracranial abnormality.  CT CERVICAL SPINE  Findings: There are moderate degenerative changes throughout cervical spine.  These are identified at all levels. There is loss of cervical lordosis.  This may be secondary to splinting, soft tissue injury, or positioning.  Curvature is felt to be more likely related to degenerative changes.  There is retrolisthesis of C4 on C5, approximately 2 mm, also felt to be degenerative.  No evidence for acute fracture.  Prevertebral soft tissues have a normal appearance.  There is calcification within the left carotid bulb. Lung apices are clear.  IMPRESSION:  1.  Significant degenerative changes throughout cervical spine. 2. No evidence for acute  abnormality.  CT THORACIC SPINE  Technique:  Multidetector CT imaging of the thoracic spine was performed without intravenous contrast administration.  Multiplanar CT image reconstructions were also generated.  Findings:  There is extensive degenerative change throughout the thoracic spine.  There is an acute wedge compression fracture of T6 with approximately 20% loss of anterior height.  Fracture does not appear to extend into the posterior elements.  However the fracture does involve the entire vertebral body and there may be further compression given the patient's kyphosis.  There is an acute compression fracture of T12 with approximately 50% loss of anterior height.  Fracture does not appear to extend into the posterior elements but there is some narrowing of the canal as a result of the fracture.  Further  evaluation with MRI may be helpful.  There are multiple old posterior rib fractures.  No definite acute rib fractures.  IMPRESSION:  1.  Acute fractures of T6 and T12 as described.  There is posterior impingement upon the canal at T12 as a result of posterior buckling of the vertebral body.  Further evaluation with MRI is recommended. 2.  The fracture of T6 involves the entire vertebral body but not the posterior elements.  The patient is at risk for further loss of vertebral height at this level.  Original Report Authenticated By: Patterson Hammersmith, M.D.   Ct Cervical Spine Wo Contrast  07/28/2011  *RADIOLOGY REPORT*  Clinical Data:  Fall.  Hit her head.  No prior stroke.  Seizures. Pain all over.  Back pain.  CT HEAD WITHOUT CONTRAST  CT CERVICAL SPINE WITHOUT CONTRAST CT THORACIC SPINE WITHOUT CONTRAST  Technique:  Multidetector CT imaging of the head and cervical spine was performed following the standard protocol without intravenous contrast.  Multiplanar CT image reconstructions of the cervical spine were also generated.  Comparison:  None  CT HEAD  Findings: There is moderate central cortical atrophy.  Moderate periventricular white matter changes are consistent with small vessel disease. There is no evidence for hemorrhage, mass lesion, or acute infarction.  Bone windows show atherosclerotic calcification of the internal carotid arteries.  No calvarial fracture.  IMPRESSION:  1.  Atrophy and small vessel disease. 2. No evidence for acute intracranial abnormality.  CT CERVICAL SPINE  Findings: There are moderate degenerative changes throughout cervical spine.  These are identified at all levels. There is loss of cervical lordosis.  This may be secondary to splinting, soft tissue injury, or positioning.  Curvature is felt to be more likely related to degenerative changes.  There is retrolisthesis of C4 on C5, approximately 2 mm, also felt to be degenerative.  No evidence for acute fracture.  Prevertebral  soft tissues have a normal appearance.  There is calcification within the left carotid bulb. Lung apices are clear.  IMPRESSION:  1.  Significant degenerative changes throughout cervical spine. 2. No evidence for acute  abnormality.  CT THORACIC SPINE  Technique:  Multidetector CT imaging of the thoracic spine was performed without intravenous contrast administration.  Multiplanar CT image reconstructions were also generated.  Findings:  There is extensive degenerative change throughout the thoracic spine.  There is an acute wedge compression fracture of T6 with approximately 20% loss of anterior height.  Fracture does not appear to extend into the posterior elements.  However the fracture does involve the entire vertebral body and there may be further compression given the patient's kyphosis.  There is an acute compression fracture of T12 with approximately 50% loss of anterior height.  Fracture does not appear to extend into the posterior elements but there is some narrowing of the canal as a result of the fracture.  Further evaluation with MRI may be helpful.  There are multiple old posterior rib fractures.  No definite acute rib fractures.  IMPRESSION:  1.  Acute fractures of T6 and T12 as described.  There is posterior impingement upon the canal at T12 as a result of posterior buckling of the vertebral body.  Further evaluation with MRI is recommended. 2.  The fracture of T6 involves the entire vertebral body but not the posterior elements.  The patient is at risk for further loss of vertebral height at this level.  Original Report Authenticated By: Patterson Hammersmith, M.D.   Ct Thoracic Spine Wo Contrast  07/28/2011  *RADIOLOGY REPORT*  Clinical Data:  Fall.  Hit her head.  No prior stroke.  Seizures. Pain all over.  Back pain.  CT HEAD WITHOUT CONTRAST CT CERVICAL SPINE WITHOUT CONTRAST CT THORACIC SPINE WITHOUT CONTRAST  Technique:  Multidetector CT imaging of the head and cervical spine was performed  following the standard protocol without intravenous contrast.  Multiplanar CT image reconstructions of the cervical spine were also generated.  Comparison:  None  CT HEAD  Findings: There is moderate central cortical atrophy.  Moderate periventricular white matter changes are consistent with small vessel disease. There is no evidence for hemorrhage, mass lesion, or acute infarction.  Bone windows show atherosclerotic calcification of the internal carotid arteries.  No calvarial fracture.  IMPRESSION:  1.  Atrophy and small  vessel disease. 2. No evidence for acute intracranial abnormality.  CT CERVICAL SPINE  Findings: There are moderate degenerative changes throughout cervical spine.  These are identified at all levels. There is loss of cervical lordosis.  This may be secondary to splinting, soft tissue injury, or positioning.  Curvature is felt to be more likely related to degenerative changes.  There is retrolisthesis of C4 on C5, approximately 2 mm, also felt to be degenerative.  No evidence for acute fracture.  Prevertebral soft tissues have a normal appearance.  There is calcification within the left carotid bulb. Lung apices are clear.  IMPRESSION:  1.  Significant degenerative changes throughout cervical spine. 2. No evidence for acute  abnormality.  CT THORACIC SPINE  Technique:  Multidetector CT imaging of the thoracic spine was performed without intravenous contrast administration.  Multiplanar CT image reconstructions were also generated.  Findings:  There is extensive degenerative change throughout the thoracic spine.  There is an acute wedge compression fracture of T6 with approximately 20% loss of anterior height.  Fracture does not appear to extend into the posterior elements.  However the fracture does involve the entire vertebral body and there may be further compression given the patient's kyphosis.  There is an acute compression fracture of T12 with approximately 50% loss of anterior height.   Fracture does not appear to extend into the posterior elements but there is some narrowing of the canal as a result of the fracture.  Further evaluation with MRI may be helpful.  There are multiple old posterior rib fractures.  No definite acute rib fractures.  IMPRESSION:  1.  Acute fractures of T6 and T12 as described.  There is posterior impingement upon the canal at T12 as a result of posterior buckling of the vertebral body.  Further evaluation with MRI is recommended. 2.  The fracture of T6 involves the entire vertebral body but not the posterior elements.  The patient is at risk for further loss of vertebral height at this level.  Original Report Authenticated By: Patterson Hammersmith, M.D.     No diagnosis found.   Results for orders placed during the hospital encounter of 07/28/11  URINALYSIS, ROUTINE W REFLEX MICROSCOPIC      Component Value Range   Color, Urine YELLOW  YELLOW    APPearance CLEAR  CLEAR    Specific Gravity, Urine 1.016  1.005 - 1.030    pH 7.0  5.0 - 8.0    Glucose, UA NEGATIVE  NEGATIVE (mg/dL)   Hgb urine dipstick NEGATIVE  NEGATIVE    Bilirubin Urine NEGATIVE  NEGATIVE    Ketones, ur NEGATIVE  NEGATIVE (mg/dL)   Protein, ur 30 (*) NEGATIVE (mg/dL)   Urobilinogen, UA 1.0  0.0 - 1.0 (mg/dL)   Nitrite NEGATIVE  NEGATIVE    Leukocytes, UA SMALL (*) NEGATIVE   POCT I-STAT, CHEM 8      Component Value Range   Sodium 141  135 - 145 (mEq/L)   Potassium 3.4 (*) 3.5 - 5.1 (mEq/L)   Chloride 105  96 - 112 (mEq/L)   BUN 22  6 - 23 (mg/dL)   Creatinine, Ser 9.60  0.50 - 1.10 (mg/dL)   Glucose, Bld 454 (*) 70 - 99 (mg/dL)   Calcium, Ion 0.98  1.12 - 1.32 (mmol/L)   TCO2 25  0 - 100 (mmol/L)   Hemoglobin 13.9  12.0 - 15.0 (g/dL)   HCT 11.9  14.7 - 82.9 (%)  URINE MICROSCOPIC-ADD ON  Component Value Range   Squamous Epithelial / LPF RARE  RARE    WBC, UA 0-2  <3 (WBC/hpf)   Bacteria, UA RARE  RARE    Urine-Other MUCOUS PRESENT      MDM  Patient with good  pain control with oral medication while laying in bed. She states that she has not attempted to ambulate. Given that this 78 year old woman lives alone and has 2 new thoracic compression fractures, she'll be admitted for pain control and physical therapy to  Patient's care discussed with Dr. Jomarie Longs and patient will be admitted to a regular bed to team for    Hilario Quarry, MD 07/28/11 1339  Hilario Quarry, MD 07/28/11 1350

## 2011-07-28 NOTE — ED Notes (Signed)
Report given to [facility] on 6e.

## 2011-07-28 NOTE — ED Notes (Signed)
JYN:[id number]<AO> Expected date:<BR> Expected time:10:38 AM<BR> Means of arrival:Ambulance<BR> Comments:<BR> M80 -- Fall

## 2011-07-29 DIAGNOSIS — R269 Unspecified abnormalities of gait and mobility: Secondary | ICD-10-CM

## 2011-07-29 DIAGNOSIS — M545 Low back pain: Secondary | ICD-10-CM

## 2011-07-29 DIAGNOSIS — E119 Type 2 diabetes mellitus without complications: Secondary | ICD-10-CM

## 2011-07-29 DIAGNOSIS — IMO0002 Reserved for concepts with insufficient information to code with codable children: Secondary | ICD-10-CM

## 2011-07-29 LAB — CBC
HCT: 39.1 % (ref 36.0–46.0)
MCHC: 33.5 g/dL (ref 30.0–36.0)
Platelets: 218 10*3/uL (ref 150–400)
RDW: 12.6 % (ref 11.5–15.5)
WBC: 6.4 10*3/uL (ref 4.0–10.5)

## 2011-07-29 LAB — COMPREHENSIVE METABOLIC PANEL
ALT: 18 U/L (ref 0–35)
AST: 30 U/L (ref 0–37)
Albumin: 3.5 g/dL (ref 3.5–5.2)
Alkaline Phosphatase: 63 U/L (ref 39–117)
BUN: 12 mg/dL (ref 6–23)
Chloride: 101 mEq/L (ref 96–112)
Potassium: 3.5 mEq/L (ref 3.5–5.1)
Sodium: 137 mEq/L (ref 135–145)
Total Bilirubin: 0.7 mg/dL (ref 0.3–1.2)
Total Protein: 7.8 g/dL (ref 6.0–8.3)

## 2011-07-29 NOTE — Progress Notes (Signed)
Subjective: Patient reports That overall she's feeling pretty well today back pain is not severe for her she denies any numbness tingling or pain into her legs the  Objective: Vital signs in last 24 hours: Temp:  [97.6 F (36.4 C)-98.5 F (36.9 C)] 98.5 F (36.9 C) (05/27 1355) Pulse Rate:  [63-66] 66  (05/27 1355) Resp:  [16] 16  (05/27 1355) BP: (121-148)/(70-74) 121/70 mmHg (05/27 1355) SpO2:  [97 %-98 %] 98 % (05/27 1355)  Intake/Output from previous day: 05/26 0701 - 05/27 0700 In: 1165 [P.O.:120; I.V.:1045] Out: 900 [Urine:900] Intake/Output this shift:    Strength is 5 of 5 in her lower extremities strength is 5 out of 5 in her lower  Lab Results:  Basename 07/29/11 0413 07/28/11 1211  WBC 6.4 --  HGB 13.1 13.9  HCT 39.1 41.0  PLT 218 --   BMET  Basename 07/29/11 0413 07/28/11 1211  NA 137 141  K 3.5 3.4*  CL 101 105  CO2 25 --  GLUCOSE 94 121*  BUN 12 22  CREATININE 0.59 0.70  CALCIUM 9.2 --    Studies/Results: Ct Head Wo Contrast  07/28/2011  *RADIOLOGY REPORT*  Clinical Data:  Fall.  Hit her head.  No prior stroke.  Seizures. Pain all over.  Back pain.  CT HEAD WITHOUT CONTRAST CT CERVICAL SPINE WITHOUT CONTRAST CT THORACIC SPINE WITHOUT CONTRAST  Technique:  Multidetector CT imaging of the head and cervical spine was performed following the standard protocol without intravenous contrast.  Multiplanar CT image reconstructions of the cervical spine were also generated.  Comparison:  None  CT HEAD  Findings: There is moderate central cortical atrophy.  Moderate periventricular white matter changes are consistent with small vessel disease. There is no evidence for hemorrhage, mass lesion, or acute infarction.  Bone windows show atherosclerotic calcification of the internal carotid arteries.  No calvarial fracture.  IMPRESSION:  1.  Atrophy and small vessel disease. 2. No evidence for acute intracranial abnormality.  CT CERVICAL SPINE  Findings: There are moderate  degenerative changes throughout cervical spine.  These are identified at all levels. There is loss of cervical lordosis.  This may be secondary to splinting, soft tissue injury, or positioning.  Curvature is felt to be more likely related to degenerative changes.  There is retrolisthesis of C4 on C5, approximately 2 mm, also felt to be degenerative.  No evidence for acute fracture.  Prevertebral soft tissues have a normal appearance.  There is calcification within the left carotid bulb. Lung apices are clear.  IMPRESSION:  1.  Significant degenerative changes throughout cervical spine. 2. No evidence for acute  abnormality.  CT THORACIC SPINE  Technique:  Multidetector CT imaging of the thoracic spine was performed without intravenous contrast administration.  Multiplanar CT image reconstructions were also generated.  Findings:  There is extensive degenerative change throughout the thoracic spine.  There is an acute wedge compression fracture of T6 with approximately 20% loss of anterior height.  Fracture does not appear to extend into the posterior elements.  However the fracture does involve the entire vertebral body and there may be further compression given the patient's kyphosis.  There is an acute compression fracture of T12 with approximately 50% loss of anterior height.  Fracture does not appear to extend into the posterior elements but there is some narrowing of the canal as a result of the fracture.  Further evaluation with MRI may be helpful.  There are multiple old posterior rib fractures.  No  definite acute rib fractures.  IMPRESSION:  1.  Acute fractures of T6 and T12 as described.  There is posterior impingement upon the canal at T12 as a result of posterior buckling of the vertebral body.  Further evaluation with MRI is recommended. 2.  The fracture of T6 involves the entire vertebral body but not the posterior elements.  The patient is at risk for further loss of vertebral height at this level.   Original Report Authenticated By: Patterson Hammersmith, M.D.   Ct Cervical Spine Wo Contrast  07/28/2011  *RADIOLOGY REPORT*  Clinical Data:  Fall.  Hit her head.  No prior stroke.  Seizures. Pain all over.  Back pain.  CT HEAD WITHOUT CONTRAST CT CERVICAL SPINE WITHOUT CONTRAST CT THORACIC SPINE WITHOUT CONTRAST  Technique:  Multidetector CT imaging of the head and cervical spine was performed following the standard protocol without intravenous contrast.  Multiplanar CT image reconstructions of the cervical spine were also generated.  Comparison:  None  CT HEAD  Findings: There is moderate central cortical atrophy.  Moderate periventricular white matter changes are consistent with small vessel disease. There is no evidence for hemorrhage, mass lesion, or acute infarction.  Bone windows show atherosclerotic calcification of the internal carotid arteries.  No calvarial fracture.  IMPRESSION:  1.  Atrophy and small vessel disease. 2. No evidence for acute intracranial abnormality.  CT CERVICAL SPINE  Findings: There are moderate degenerative changes throughout cervical spine.  These are identified at all levels. There is loss of cervical lordosis.  This may be secondary to splinting, soft tissue injury, or positioning.  Curvature is felt to be more likely related to degenerative changes.  There is retrolisthesis of C4 on C5, approximately 2 mm, also felt to be degenerative.  No evidence for acute fracture.  Prevertebral soft tissues have a normal appearance.  There is calcification within the left carotid bulb. Lung apices are clear.  IMPRESSION:  1.  Significant degenerative changes throughout cervical spine. 2. No evidence for acute  abnormality.  CT THORACIC SPINE  Technique:  Multidetector CT imaging of the thoracic spine was performed without intravenous contrast administration.  Multiplanar CT image reconstructions were also generated.  Findings:  There is extensive degenerative change throughout the thoracic  spine.  There is an acute wedge compression fracture of T6 with approximately 20% loss of anterior height.  Fracture does not appear to extend into the posterior elements.  However the fracture does involve the entire vertebral body and there may be further compression given the patient's kyphosis.  There is an acute compression fracture of T12 with approximately 50% loss of anterior height.  Fracture does not appear to extend into the posterior elements but there is some narrowing of the canal as a result of the fracture.  Further evaluation with MRI may be helpful.  There are multiple old posterior rib fractures.  No definite acute rib fractures.  IMPRESSION:  1.  Acute fractures of T6 and T12 as described.  There is posterior impingement upon the canal at T12 as a result of posterior buckling of the vertebral body.  Further evaluation with MRI is recommended. 2.  The fracture of T6 involves the entire vertebral body but not the posterior elements.  The patient is at risk for further loss of vertebral height at this level.  Original Report Authenticated By: Patterson Hammersmith, M.D.   Ct Thoracic Spine Wo Contrast  07/28/2011  *RADIOLOGY REPORT*  Clinical Data:  Fall.  Hit her  head.  No prior stroke.  Seizures. Pain all over.  Back pain.  CT HEAD WITHOUT CONTRAST CT CERVICAL SPINE WITHOUT CONTRAST CT THORACIC SPINE WITHOUT CONTRAST  Technique:  Multidetector CT imaging of the head and cervical spine was performed following the standard protocol without intravenous contrast.  Multiplanar CT image reconstructions of the cervical spine were also generated.  Comparison:  None  CT HEAD  Findings: There is moderate central cortical atrophy.  Moderate periventricular white matter changes are consistent with small vessel disease. There is no evidence for hemorrhage, mass lesion, or acute infarction.  Bone windows show atherosclerotic calcification of the internal carotid arteries.  No calvarial fracture.  IMPRESSION:   1.  Atrophy and small vessel disease. 2. No evidence for acute intracranial abnormality.  CT CERVICAL SPINE  Findings: There are moderate degenerative changes throughout cervical spine.  These are identified at all levels. There is loss of cervical lordosis.  This may be secondary to splinting, soft tissue injury, or positioning.  Curvature is felt to be more likely related to degenerative changes.  There is retrolisthesis of C4 on C5, approximately 2 mm, also felt to be degenerative.  No evidence for acute fracture.  Prevertebral soft tissues have a normal appearance.  There is calcification within the left carotid bulb. Lung apices are clear.  IMPRESSION:  1.  Significant degenerative changes throughout cervical spine. 2. No evidence for acute  abnormality.  CT THORACIC SPINE  Technique:  Multidetector CT imaging of the thoracic spine was performed without intravenous contrast administration.  Multiplanar CT image reconstructions were also generated.  Findings:  There is extensive degenerative change throughout the thoracic spine.  There is an acute wedge compression fracture of T6 with approximately 20% loss of anterior height.  Fracture does not appear to extend into the posterior elements.  However the fracture does involve the entire vertebral body and there may be further compression given the patient's kyphosis.  There is an acute compression fracture of T12 with approximately 50% loss of anterior height.  Fracture does not appear to extend into the posterior elements but there is some narrowing of the canal as a result of the fracture.  Further evaluation with MRI may be helpful.  There are multiple old posterior rib fractures.  No definite acute rib fractures.  IMPRESSION:  1.  Acute fractures of T6 and T12 as described.  There is posterior impingement upon the canal at T12 as a result of posterior buckling of the vertebral body.  Further evaluation with MRI is recommended. 2.  The fracture of T6 involves  the entire vertebral body but not the posterior elements.  The patient is at risk for further loss of vertebral height at this level.  Original Report Authenticated By: Patterson Hammersmith, M.D.   Mr Thoracic Spine W Contrast  07/28/2011  *RADIOLOGY REPORT*  Clinical Data: T6 and T12 fracture.  Assess for spinal compression  MRI THORACIC SPINE WITHout CONTRAST  Technique:  Multiplanar and multiecho pulse sequences of the thoracic spine were obtained .  Comparison: Chest CT same day  Findings: The examination suffers from considerable motion.  There is an acute compression fracture affecting T6.  No retropulsed bone.  Ample subarachnoid space surrounds the spinal cord.  No traumatic disc herniation.  There is an old healed compression deformity at T12.  There is posterior bowing of the posterior-superior margin of the vertebral body by 4 mm.  This narrows the ventral subarachnoid space but does not compress the cord.  No other acute fracture.  No other significant degenerative changes in the thoracic region.  No abnormal cord signal.  Paravertebral soft tissues appear unremarkable.  IMPRESSION: The T6 fracture is acute/subacute.  There is loss of height of about 25%.  No retropulsed bone.  No encroachment upon the neural spaces.  The T12 fracture is old and healed.  Chronically retropulsed bone encroaches upon the spinal canal by 4 mm but does not appear to cause gross neural compression.  There is no suggestion that these are other than benign osteoporotic fractures.  Original Report Authenticated By: Thomasenia Sales, M.D.    Assessment/Plan: 78 year old with T6 mild anterior wedge compression fracture T12 burst fracture she was fitted for a brace today but it is not a good fit for her he rides up into her neck and her axilla. [employer] will be notified to make some adjustments or potentially find a different brace for her if it  LOS: 1 day     Philander Ake P 07/29/2011, 8:47 PM

## 2011-07-29 NOTE — Progress Notes (Signed)
Notified PA on call:  Pt has hx of DM and no CBG checks or SSI ordered. Neither needed at this time per Elray Mcgregor PA

## 2011-07-29 NOTE — Progress Notes (Addendum)
Subjective: Doing well, pain controlled, has not gotten up yet Objective: Vital signs in last 24 hours: Temp:  [97.5 F (36.4 C)-98.5 F (36.9 C)] 98.4 F (36.9 C) (05/27 0435) Pulse Rate:  [63-67] 65  (05/27 0906) Resp:  [16] 16  (05/27 0435) BP: (121-183)/(71-76) 121/72 mmHg (05/27 0907) SpO2:  [97 %-98 %] 97 % (05/27 0435) Weight:  [54.885 kg (121 lb)] 54.885 kg (121 lb) (05/26 1524) Weight change:  Last BM Date: 07/27/11  Intake/Output from previous day: 05/26 0701 - 05/27 0700 In: 1165 [P.O.:120; I.V.:1045] Out: 900 [Urine:900] Total I/O In: 240 [P.O.:240] Out: -    Physical Exam: General: Alert, awake, oriented x3, in no acute distress. HEENT: No bruits, no goiter. Heart: Regular rate and rhythm, without murmurs, rubs, gallops. Lungs: Clear to auscultation bilaterally. Abdomen: Soft, nontender, nondistended, positive bowel sounds. Extremities: No clubbing cyanosis or edema with positive pedal pulses. Neuro: Grossly intact, nonfocal.    Lab Results: Basic Metabolic Panel:  Basename 07/29/11 0413 07/28/11 1211  NA 137 141  K 3.5 3.4*  CL 101 105  CO2 25 --  GLUCOSE 94 121*  BUN 12 22  CREATININE 0.59 0.70  CALCIUM 9.2 --  MG -- --  PHOS -- --   Liver Function Tests:  Basename 07/29/11 0413  AST 30  ALT 18  ALKPHOS 63  BILITOT 0.7  PROT 7.8  ALBUMIN 3.5   No results found for this basename: LIPASE:2,AMYLASE:2 in the last 72 hours No results found for this basename: AMMONIA:2 in the last 72 hours CBC:  Basename 07/29/11 0413 07/28/11 1211  WBC 6.4 --  NEUTROABS -- --  HGB 13.1 13.9  HCT 39.1 41.0  MCV 95.8 --  PLT 218 --   Cardiac Enzymes: No results found for this basename: CKTOTAL:3,CKMB:3,CKMBINDEX:3,TROPONINI:3 in the last 72 hours BNP: No results found for this basename: PROBNP:3 in the last 72 hours D-Dimer: No results found for this basename: DDIMER:2 in the last 72 hours CBG:  Basename 07/28/11 1527  GLUCAP 84   Hemoglobin  A1C: No results found for this basename: HGBA1C in the last 72 hours Fasting Lipid Panel: No results found for this basename: CHOL,HDL,LDLCALC,TRIG,CHOLHDL,LDLDIRECT in the last 72 hours Thyroid Function Tests: No results found for this basename: TSH,T4TOTAL,FREET4,T3FREE,THYROIDAB in the last 72 hours Anemia Panel: No results found for this basename: VITAMINB12,FOLATE,FERRITIN,TIBC,IRON,RETICCTPCT in the last 72 hours Coagulation: No results found for this basename: LABPROT:2,INR:2 in the last 72 hours Urine Drug Screen: Drugs of Abuse  No results found for this basename: labopia, cocainscrnur, labbenz, amphetmu, thcu, labbarb    Alcohol Level: No results found for this basename: ETH:2 in the last 72 hours Urinalysis:  Basename 07/28/11 1158  COLORURINE YELLOW  LABSPEC 1.016  PHURINE 7.0  GLUCOSEU NEGATIVE  HGBUR NEGATIVE  BILIRUBINUR NEGATIVE  KETONESUR NEGATIVE  PROTEINUR 30*  UROBILINOGEN 1.0  NITRITE NEGATIVE  LEUKOCYTESUR SMALL*    No results found for this or any previous visit (from the past 240 hour(s)).  Studies/Results: Ct Head Wo Contrast  07/28/2011  *RADIOLOGY REPORT*  Clinical Data:  Fall.  Hit her head.  No prior stroke.  Seizures. Pain all over.  Back pain.  CT HEAD WITHOUT CONTRAST CT CERVICAL SPINE WITHOUT CONTRAST CT THORACIC SPINE WITHOUT CONTRAST  Technique:  Multidetector CT imaging of the head and cervical spine was performed following the standard protocol without intravenous contrast.  Multiplanar CT image reconstructions of the cervical spine were also generated.  Comparison:  None  CT HEAD  Findings: There is moderate central cortical atrophy.  Moderate periventricular white matter changes are consistent with small vessel disease. There is no evidence for hemorrhage, mass lesion, or acute infarction.  Bone windows show atherosclerotic calcification of the internal carotid arteries.  No calvarial fracture.  IMPRESSION:  1.  Atrophy and small vessel  disease. 2. No evidence for acute intracranial abnormality.  CT CERVICAL SPINE  Findings: There are moderate degenerative changes throughout cervical spine.  These are identified at all levels. There is loss of cervical lordosis.  This may be secondary to splinting, soft tissue injury, or positioning.  Curvature is felt to be more likely related to degenerative changes.  There is retrolisthesis of C4 on C5, approximately 2 mm, also felt to be degenerative.  No evidence for acute fracture.  Prevertebral soft tissues have a normal appearance.  There is calcification within the left carotid bulb. Lung apices are clear.  IMPRESSION:  1.  Significant degenerative changes throughout cervical spine. 2. No evidence for acute  abnormality.  CT THORACIC SPINE  Technique:  Multidetector CT imaging of the thoracic spine was performed without intravenous contrast administration.  Multiplanar CT image reconstructions were also generated.  Findings:  There is extensive degenerative change throughout the thoracic spine.  There is an acute wedge compression fracture of T6 with approximately 20% loss of anterior height.  Fracture does not appear to extend into the posterior elements.  However the fracture does involve the entire vertebral body and there may be further compression given the patient's kyphosis.  There is an acute compression fracture of T12 with approximately 50% loss of anterior height.  Fracture does not appear to extend into the posterior elements but there is some narrowing of the canal as a result of the fracture.  Further evaluation with MRI may be helpful.  There are multiple old posterior rib fractures.  No definite acute rib fractures.  IMPRESSION:  1.  Acute fractures of T6 and T12 as described.  There is posterior impingement upon the canal at T12 as a result of posterior buckling of the vertebral body.  Further evaluation with MRI is recommended. 2.  The fracture of T6 involves the entire vertebral body but  not the posterior elements.  The patient is at risk for further loss of vertebral height at this level.  Original Report Authenticated By: Patterson Hammersmith, M.D.   Ct Cervical Spine Wo Contrast  07/28/2011  *RADIOLOGY REPORT*  Clinical Data:  Fall.  Hit her head.  No prior stroke.  Seizures. Pain all over.  Back pain.  CT HEAD WITHOUT CONTRAST CT CERVICAL SPINE WITHOUT CONTRAST CT THORACIC SPINE WITHOUT CONTRAST  Technique:  Multidetector CT imaging of the head and cervical spine was performed following the standard protocol without intravenous contrast.  Multiplanar CT image reconstructions of the cervical spine were also generated.  Comparison:  None  CT HEAD  Findings: There is moderate central cortical atrophy.  Moderate periventricular white matter changes are consistent with small vessel disease. There is no evidence for hemorrhage, mass lesion, or acute infarction.  Bone windows show atherosclerotic calcification of the internal carotid arteries.  No calvarial fracture.  IMPRESSION:  1.  Atrophy and small vessel disease. 2. No evidence for acute intracranial abnormality.  CT CERVICAL SPINE  Findings: There are moderate degenerative changes throughout cervical spine.  These are identified at all levels. There is loss of cervical lordosis.  This may be secondary to splinting, soft tissue injury, or positioning.  Curvature is  felt to be more likely related to degenerative changes.  There is retrolisthesis of C4 on C5, approximately 2 mm, also felt to be degenerative.  No evidence for acute fracture.  Prevertebral soft tissues have a normal appearance.  There is calcification within the left carotid bulb. Lung apices are clear.  IMPRESSION:  1.  Significant degenerative changes throughout cervical spine. 2. No evidence for acute  abnormality.  CT THORACIC SPINE  Technique:  Multidetector CT imaging of the thoracic spine was performed without intravenous contrast administration.  Multiplanar CT image  reconstructions were also generated.  Findings:  There is extensive degenerative change throughout the thoracic spine.  There is an acute wedge compression fracture of T6 with approximately 20% loss of anterior height.  Fracture does not appear to extend into the posterior elements.  However the fracture does involve the entire vertebral body and there may be further compression given the patient's kyphosis.  There is an acute compression fracture of T12 with approximately 50% loss of anterior height.  Fracture does not appear to extend into the posterior elements but there is some narrowing of the canal as a result of the fracture.  Further evaluation with MRI may be helpful.  There are multiple old posterior rib fractures.  No definite acute rib fractures.  IMPRESSION:  1.  Acute fractures of T6 and T12 as described.  There is posterior impingement upon the canal at T12 as a result of posterior buckling of the vertebral body.  Further evaluation with MRI is recommended. 2.  The fracture of T6 involves the entire vertebral body but not the posterior elements.  The patient is at risk for further loss of vertebral height at this level.  Original Report Authenticated By: Patterson Hammersmith, M.D.   Ct Thoracic Spine Wo Contrast  07/28/2011  *RADIOLOGY REPORT*  Clinical Data:  Fall.  Hit her head.  No prior stroke.  Seizures. Pain all over.  Back pain.  CT HEAD WITHOUT CONTRAST CT CERVICAL SPINE WITHOUT CONTRAST CT THORACIC SPINE WITHOUT CONTRAST  Technique:  Multidetector CT imaging of the head and cervical spine was performed following the standard protocol without intravenous contrast.  Multiplanar CT image reconstructions of the cervical spine were also generated.  Comparison:  None  CT HEAD  Findings: There is moderate central cortical atrophy.  Moderate periventricular white matter changes are consistent with small vessel disease. There is no evidence for hemorrhage, mass lesion, or acute infarction.  Bone  windows show atherosclerotic calcification of the internal carotid arteries.  No calvarial fracture.  IMPRESSION:  1.  Atrophy and small vessel disease. 2. No evidence for acute intracranial abnormality.  CT CERVICAL SPINE  Findings: There are moderate degenerative changes throughout cervical spine.  These are identified at all levels. There is loss of cervical lordosis.  This may be secondary to splinting, soft tissue injury, or positioning.  Curvature is felt to be more likely related to degenerative changes.  There is retrolisthesis of C4 on C5, approximately 2 mm, also felt to be degenerative.  No evidence for acute fracture.  Prevertebral soft tissues have a normal appearance.  There is calcification within the left carotid bulb. Lung apices are clear.  IMPRESSION:  1.  Significant degenerative changes throughout cervical spine. 2. No evidence for acute  abnormality.  CT THORACIC SPINE  Technique:  Multidetector CT imaging of the thoracic spine was performed without intravenous contrast administration.  Multiplanar CT image reconstructions were also generated.  Findings:  There is extensive  degenerative change throughout the thoracic spine.  There is an acute wedge compression fracture of T6 with approximately 20% loss of anterior height.  Fracture does not appear to extend into the posterior elements.  However the fracture does involve the entire vertebral body and there may be further compression given the patient's kyphosis.  There is an acute compression fracture of T12 with approximately 50% loss of anterior height.  Fracture does not appear to extend into the posterior elements but there is some narrowing of the canal as a result of the fracture.  Further evaluation with MRI may be helpful.  There are multiple old posterior rib fractures.  No definite acute rib fractures.  IMPRESSION:  1.  Acute fractures of T6 and T12 as described.  There is posterior impingement upon the canal at T12 as a result of  posterior buckling of the vertebral body.  Further evaluation with MRI is recommended. 2.  The fracture of T6 involves the entire vertebral body but not the posterior elements.  The patient is at risk for further loss of vertebral height at this level.  Original Report Authenticated By: Patterson Hammersmith, M.D.   Mr Thoracic Spine W Contrast  07/28/2011  *RADIOLOGY REPORT*  Clinical Data: T6 and T12 fracture.  Assess for spinal compression  MRI THORACIC SPINE WITHout CONTRAST  Technique:  Multiplanar and multiecho pulse sequences of the thoracic spine were obtained .  Comparison: Chest CT same day  Findings: The examination suffers from considerable motion.  There is an acute compression fracture affecting T6.  No retropulsed bone.  Ample subarachnoid space surrounds the spinal cord.  No traumatic disc herniation.  There is an old healed compression deformity at T12.  There is posterior bowing of the posterior-superior margin of the vertebral body by 4 mm.  This narrows the ventral subarachnoid space but does not compress the cord.  No other acute fracture.  No other significant degenerative changes in the thoracic region.  No abnormal cord signal.  Paravertebral soft tissues appear unremarkable.  IMPRESSION: The T6 fracture is acute/subacute.  There is loss of height of about 25%.  No retropulsed bone.  No encroachment upon the neural spaces.  The T12 fracture is old and healed.  Chronically retropulsed bone encroaches upon the spinal canal by 4 mm but does not appear to cause gross neural compression.  There is no suggestion that these are other than benign osteoporotic fractures.  Original Report Authenticated By: Thomasenia Sales, M.D.    Medications: Scheduled Meds:   . amLODipine  5 mg Oral Daily  . aspirin  81 mg Oral Daily  . DULoxetine  60 mg Oral Daily  . levothyroxine  50 mcg Oral Daily  . LORazepam  1 mg Intravenous Once  . metoprolol  50 mg Oral Daily  . naproxen  375 mg Oral BID WC  .  oxyCODONE-acetaminophen  1 tablet Oral Once  . pantoprazole  40 mg Oral Q1200  . polyethylene glycol  17 g Oral Daily  . DISCONTD: nitrofurantoin (macrocrystal-monohydrate)  100 mg Oral Daily   Continuous Infusions:   . DISCONTD: sodium chloride 75 mL/hr at 07/29/11 0656   PRN Meds:.acetaminophen, acetaminophen, HYDROmorphone (DILAUDID) injection, ondansetron (ZOFRAN) IV, ondansetron, oxyCODONE-acetaminophen  Assessment/Plan: 1. Mechanical fall  2. T6/12 Vertebral Compression fracture  3. DM (diabetes mellitus)  4. HTN (hypertension)  Plan:  MRI results noted, Appreciate Dr.Crams's input Continue naproxen, percocet PRN  Being fitted for back brace Will continue other home medications  Will  request PT eval  Discussed code status: DNR Called and updated daughter Nicholos Johns at [phone]    LOS: 1 day   [facility] Pager: [phone] 07/29/2011, 11:43 AM

## 2011-07-29 NOTE — Evaluation (Signed)
Physical Therapy Evaluation Patient Details Name: Jodi Price MRN: [id number] DOB: Apr 15, 1919 Today's Date: 07/29/2011 Time: 4098-1191 PT Time Calculation (min): 35 min  PT Assessment / Plan / Recommendation Clinical Impression  Pt presents with T6 and T12 compression fractures and demonstrates decreased overall strength and mobility.  Tolerated bed mobility with education on log rolling and donning back brace and ambulation in hallway.  Pt will benefit from skilled PT in acute venue to address deficits.  Discussed D/C options with pt and family with family stating they will not be able to provide 24/7 supervision/assist initially, therefore PT recommends short term SNF for follow up to return pt to prior level of functioning.       PT Assessment  Patient needs continued PT services    Follow Up Recommendations  Skilled nursing facility;Supervision/Assistance - 24 hour    Barriers to Discharge Decreased caregiver support      lEquipment Recommendations  Defer to next venue    Recommendations for Other Services OT consult   Frequency Min 4X/week    Precautions / Restrictions Precautions Precautions: Back Precaution Comments: Provided education to pt/family.  Restrictions Weight Bearing Restrictions: No   Pertinent Vitals/Pain No pain      Mobility  Bed Mobility Bed Mobility: Rolling Left;Left Sidelying to Sit Rolling Left: 4: Min assist;3: Mod assist;With rail Left Sidelying to Sit: 4: Min assist;With rails Details for Bed Mobility Assistance: Provided cues for log rolling and provided hand held assist for pt.  Transfers Transfers: Sit to Stand;Stand to Sit Sit to Stand: 4: Min assist;With upper extremity assist;From bed Stand to Sit: 4: Min assist;With upper extremity assist;With armrests;To chair/3-in-1 Details for Transfer Assistance: Cues for hand placement, safety and upright posture when sitting/standing.  Ambulation/Gait Ambulation/Gait Assistance: 4: Min  assist Ambulation Distance (Feet): 60 Feet Assistive device: Rolling walker Ambulation/Gait Assistance Details: Cues for upright posture and sequencing/technique when ambulating with RW.  Gait Pattern: Step-through pattern;Decreased stride length;Trunk flexed Gait velocity: decreased Stairs: No Wheelchair Mobility Wheelchair Mobility: No    Exercises     PT Diagnosis: Difficulty walking;Generalized weakness;Acute pain  PT Problem List: Decreased strength;Decreased activity tolerance;Decreased mobility;Decreased knowledge of use of DME;Decreased knowledge of precautions;Pain PT Treatment Interventions: DME instruction;Gait training;Functional mobility training;Therapeutic activities;Therapeutic exercise;Balance training;Patient/family education   PT Goals Acute Rehab PT Goals PT Goal Formulation: With patient Time For Goal Achievement: 08/05/11 Potential to Achieve Goals: Good Pt will Roll Supine to Left Side: with supervision PT Goal: Rolling Supine to Left Side - Progress: Goal set today Pt will go Supine/Side to Sit: with supervision PT Goal: Supine/Side to Sit - Progress: Goal set today Pt will go Sit to Supine/Side: with supervision PT Goal: Sit to Supine/Side - Progress: Goal set today Pt will go Sit to Stand: with supervision PT Goal: Sit to Stand - Progress: Goal set today Pt will go Stand to Sit: with supervision PT Goal: Stand to Sit - Progress: Goal set today Pt will Ambulate: 51 - 150 feet;with supervision;with least restrictive assistive device PT Goal: Ambulate - Progress: Goal set today  Visit Information  Last PT Received On: 07/29/11 Assistance Needed: +1    Subjective Data  Subjective: I want to get up and go to the bathroom. Patient Stated Goal: To get back to PLOF.    Prior Functioning  Home Living Lives With: Alone;Other (Comment) (Lives in independent living) Available Help at Discharge: Skilled Nursing Facility Type of Home: Independent living  facility Home Access: Level entry Home Layout: One level  Bathroom Shower/Tub: Health visitor: Handicapped height Home Adaptive Equipment: Environmental consultant - four wheeled Prior Function Level of Independence: Independent with assistive device(s) Driving: No Vocation: Retired Musician: No difficulties    Cognition  Overall Cognitive Status: Appears within functional limits for tasks assessed/performed Arousal/Alertness: Awake/alert Orientation Level: Appears intact for tasks assessed Behavior During Session: [facility] for tasks performed    Extremity/Trunk Assessment Right Lower Extremity Assessment RLE ROM/Strength/Tone: WFL for tasks assessed RLE Sensation: WFL - Light Touch RLE Coordination: WFL - gross motor Left Lower Extremity Assessment LLE ROM/Strength/Tone: WFL for tasks assessed LLE Sensation: WFL - Light Touch Trunk Assessment Trunk Assessment: Kyphotic   Balance    End of Session PT - End of Session Equipment Utilized During Treatment: Back brace Activity Tolerance: Patient tolerated treatment well Patient left: in chair;with call bell/phone within reach;with family/visitor present Nurse Communication: Mobility status;Other (comment) (spoke with RN about contacting [employer])   Page, Meribeth Mattes 07/29/2011, 5:15 PM

## 2011-07-29 NOTE — Progress Notes (Signed)
Utilization review completed.  

## 2011-07-30 MED ORDER — NAPROXEN 375 MG PO TABS: 375.0000 mg | ORAL_TABLET | Freq: Two times a day (BID) | ORAL | Status: DC

## 2011-07-30 MED ORDER — OXYCODONE-ACETAMINOPHEN 5-325 MG PO TABS: 1.0000 | ORAL_TABLET | ORAL | Status: AC | PRN

## 2011-07-30 MED ORDER — ACETAMINOPHEN 325 MG PO TABS: 650.0000 mg | ORAL_TABLET | Freq: Four times a day (QID) | ORAL | Status: AC | PRN

## 2011-07-30 MED ORDER — POLYETHYLENE GLYCOL 3350 17 G PO PACK: 17.0000 g | PACK | Freq: Every day | ORAL | Status: AC

## 2011-07-30 MED ORDER — NAPROXEN 375 MG PO TABS: 375.0000 mg | ORAL_TABLET | Freq: Two times a day (BID) | ORAL | Status: AC

## 2011-07-30 NOTE — Progress Notes (Signed)
CARE MANAGEMENT NOTE 07/30/2011  Patient:  BRUNETTE, LAVALLE   Account Number:  [id number]  Date Initiated:  07/30/2011  Documentation initiated by:  Colleen Can  Subjective/Objective Assessment:   dx fall sustaining fractures of T6 and T12     Action/Plan:   CM spoke with patient and pt's daughter. Current plans are for patient to return to her home in [place] where she will have 24/7 care provided . Orders for HHpt and RW   Anticipated DC Date:  07/30/2011   Anticipated DC Plan:  HOME W HOME HEALTH SERVICES  In-house referral  Clinical Social Worker      DC Associate Professor  CM consult      [facility] Choice  HOME HEALTH  DURABLE MEDICAL EQUIPMENT   Choice offered to / List presented to:  C-4 Adult Children   DME arranged  [facility]      DME agency  [facility]     HH arranged  HH-2 PT      [facility] agency  [facility]   Status of service:  Completed, signed off Medicare Important Message given?   (If response is "NO", the following Medicare IM given date fields will be blank) Discharge Disposition:  Home with home health services  Comments:  07/30/2011 Raynelle Bring BSN CCM [phone] Pt's hospital stay is onbservation status. Code 44 notice was given to patient 05/27 by CM on duty. Plans for discharge to home with [facility] in place.

## 2011-07-30 NOTE — Progress Notes (Signed)
Physical Therapy Treatment Patient Details Name: Jodi Price MRN: [id number] DOB: 04-29-1919 Today's Date: 07/30/2011 Time: 1235-1300 PT Time Calculation (min): 25 min  PT Assessment / Plan / Recommendation Comments on Treatment Session  Pt progressing well with mobility.  Had long discussion about D/C plans with pt and family.  Pt to D/C home with HHPT today.  Educated pt and family on donning brace with [employer].      Follow Up Recommendations  Home health PT    Barriers to Discharge        Equipment Recommendations  Rolling walker with 5" wheels    Recommendations for Other Services    Frequency Min 4X/week   Plan Discharge plan needs to be updated    Precautions / Restrictions Precautions Precautions: Back Precaution Comments: Provided education to pt/family.  Required Braces or Orthoses: Spinal Brace (TLSO) Spinal Brace: Applied in sitting position;Thoracolumbosacral orthotic Restrictions Weight Bearing Restrictions: No   Pertinent Vitals/Pain No pain    Mobility  Bed Mobility Bed Mobility: Not assessed (Pt was already up in recliner) Transfers Transfers: Sit to Stand;Stand to Sit Sit to Stand: 4: Min guard;With upper extremity assist;With armrests;From chair/3-in-1 Stand to Sit: 4: Min guard;With upper extremity assist;With armrests;To chair/3-in-1 Details for Transfer Assistance: Min/guard for safety with cues for hand placement.  Ambulation/Gait Ambulation/Gait Assistance: 4: Min guard Ambulation Distance (Feet): 200 Feet Assistive device: Rolling walker Ambulation/Gait Assistance Details: cues for maintaining upright posture.  Gait Pattern: Step-through pattern;Decreased stride length;Trunk flexed Gait velocity: decreased    Exercises     PT Diagnosis:    PT Problem List:   PT Treatment Interventions:     PT Goals Acute Rehab PT Goals PT Goal Formulation: With patient Time For Goal Achievement: 08/05/11 Potential to Achieve Goals: Good Pt will go  Sit to Stand: with supervision PT Goal: Sit to Stand - Progress: Progressing toward goal Pt will go Stand to Sit: with supervision PT Goal: Stand to Sit - Progress: Progressing toward goal Pt will Ambulate: 51 - 150 feet;with supervision;with least restrictive assistive device PT Goal: Ambulate - Progress: Progressing toward goal  Visit Information  Last PT Received On: 07/30/11 Assistance Needed: +1    Subjective Data  Subjective: Im ready to get up and walk.  Patient Stated Goal: To get back to PLOF.    Cognition  Overall Cognitive Status: Appears within functional limits for tasks assessed/performed Arousal/Alertness: Awake/alert Orientation Level: Appears intact for tasks assessed Behavior During Session: [facility] for tasks performed    Balance     End of Session PT - End of Session Equipment Utilized During Treatment: Back brace Activity Tolerance: Patient tolerated treatment well Patient left: in chair;with call bell/phone within reach;with family/visitor present Nurse Communication: Mobility status    Page, Meribeth Mattes 07/30/2011, 1:52 PM

## 2011-07-30 NOTE — Progress Notes (Signed)
CSW met with pt/daughter this afternoon to assist with d/c planning. Pt is unable to use medicare for ST SNF placement. ( No qualifying stay ). Pt/daughter decline using medicaid for SNF placement and are unable to pay out of pocket for placement. Pt will return home with [facility] services and family support. RNCM is assisting with d/c planning.  Cori Razor  LCSW  [phone]

## 2011-07-30 NOTE — Discharge Summary (Signed)
Physician Discharge Summary  Patient ID: Jodi Price MRN: [id number] DOB/AGE: [birth year] 76 y.o.  Admit date: 07/28/2011 Discharge date: 07/30/2011  Primary Care Physician:  Jodi Lights, MD  Discharge Diagnoses:   1. T6 vertebral compression fracture 2. chronic T12 fracture, healed 3. mechanical fall 4. DM (diabetes mellitus) 5. HTN (hypertension) 6. gait disorder 7. Hypothyroidism 8. GERD 9. depression   Medication List  As of 07/30/2011 12:48 PM   TAKE these medications         acetaminophen 325 MG tablet   Commonly known as: TYLENOL   Take 2 tablets (650 mg total) by mouth every 6 (six) hours as needed for pain or fever (or Fever >/= 101).      amLODipine 5 MG tablet   Commonly known as: NORVASC   Take 5 mg by mouth daily.      aspirin 81 MG tablet   Take 81 mg by mouth daily.      DULoxetine 60 MG capsule   Commonly known as: CYMBALTA   Take 60 mg by mouth daily.      levothyroxine 50 MCG tablet   Commonly known as: SYNTHROID, LEVOTHROID   Take 50 mcg by mouth daily.      metoprolol 50 MG tablet   Commonly known as: LOPRESSOR   Take 50 mg by mouth daily.      naproxen 375 MG tablet   Commonly known as: NAPROSYN   Take 1 tablet (375 mg total) by mouth 2 (two) times daily with a meal. For 3-4 days      nitrofurantoin (macrocrystal-monohydrate) 100 MG capsule   Commonly known as: MACROBID   Take 100 mg by mouth daily.      omeprazole 40 MG capsule   Commonly known as: PRILOSEC   Take 40 mg by mouth daily.      oxyCODONE-acetaminophen 5-325 MG per tablet   Commonly known as: PERCOCET   Take 1 tablet by mouth every 4 (four) hours as needed for pain.      polyethylene glycol packet   Commonly known as: MIRALAX / GLYCOLAX   Take 17 g by mouth daily.             Disposition and Follow-up:  PCP in 1 week  Consults: Jodi Price, neurosurgery Significant Diagnostic Studies:   Mr Thoracic Spine W Contrast  07/28/2011  *RADIOLOGY REPORT*   Clinical Data: T6 and T12 fracture.  Assess for spinal compression  MRI THORACIC SPINE WITHout CONTRAST  Technique:  Multiplanar and multiecho pulse sequences of the thoracic spine were obtained .  Comparison: Chest CT same day  Findings: The examination suffers from considerable motion.  There is an acute compression fracture affecting T6.  No retropulsed bone.  Ample subarachnoid space surrounds the spinal cord.  No traumatic disc herniation.  There is an old healed compression deformity at T12.  There is posterior bowing of the posterior-superior margin of the vertebral body by 4 mm.  This narrows the ventral subarachnoid space but does not compress the cord.  No other acute fracture.  No other significant degenerative changes in the thoracic region.  No abnormal cord signal.  Paravertebral soft tissues appear unremarkable.  IMPRESSION: The T6 fracture is acute/subacute.  There is loss of height of about 25%.  No retropulsed bone.  No encroachment upon the neural spaces.  The T12 fracture is old and healed.  Chronically retropulsed bone encroaches upon the spinal canal by 4 mm but does not appear to  cause gross neural compression.  There is no suggestion that these are other than benign osteoporotic fractures.  Original Report Authenticated By: Jodi Price, M.D.    Brief H and P: Jodi Price is a very pleasant 89/F history of diabetes, hypertension, hypothyroidism, gait disorder was in her usual state of health. Today while fixing her hair in the bathroom she recalls turning around and then hit something and falling backwards. She recalls hitting the right side of her back and head. Denies any presyncopal symptoms denies chest pain dizziness prior to fall.  Subsequently started having severe pain in the mid back worsened by turning, denies any weakness or numbness in either lower extremities subsequently was brought to the emergency room where she was noted to have T6 and T12 compression  fractures.  Hospital Course:  1. Mechanical fall / T6/12 Vertebral Compression fracture : She was admitted to the hospital, treated with naproxen twice a day and Percocet when necessary. She was also seen by Jodi Price from neurosurgery recommended a brace, currently working on a when appropriate sized brace. She improved on the above pain regimen and has started ambulating with physical therapy. She will be discharged to skilled nursing facility for further physical rehabilitation Rest of her chronic medical problems remain stable     Time spent on Discharge:  SignedZannie Price [facility] Pager: [phone] 07/30/2011, 12:48 PM

## 2011-07-30 NOTE — Progress Notes (Signed)
Pt for d/c home today. with [facility]. IV d/c'd. Dau at bedside to assist pt with d/c. D/C instructions & RX given with verbalized understanding. No changes in am assessments today.

## 2011-08-07 IMAGING — CR DG HUMERUS 2V *L*
2 series · 2 of 2 positions shown · non-contrast
Comparison: None available.

CLINICAL DATA: .  Fall, pain.

LEFT HUMERUS - 2+ VIEW

[w humerus ap left *]
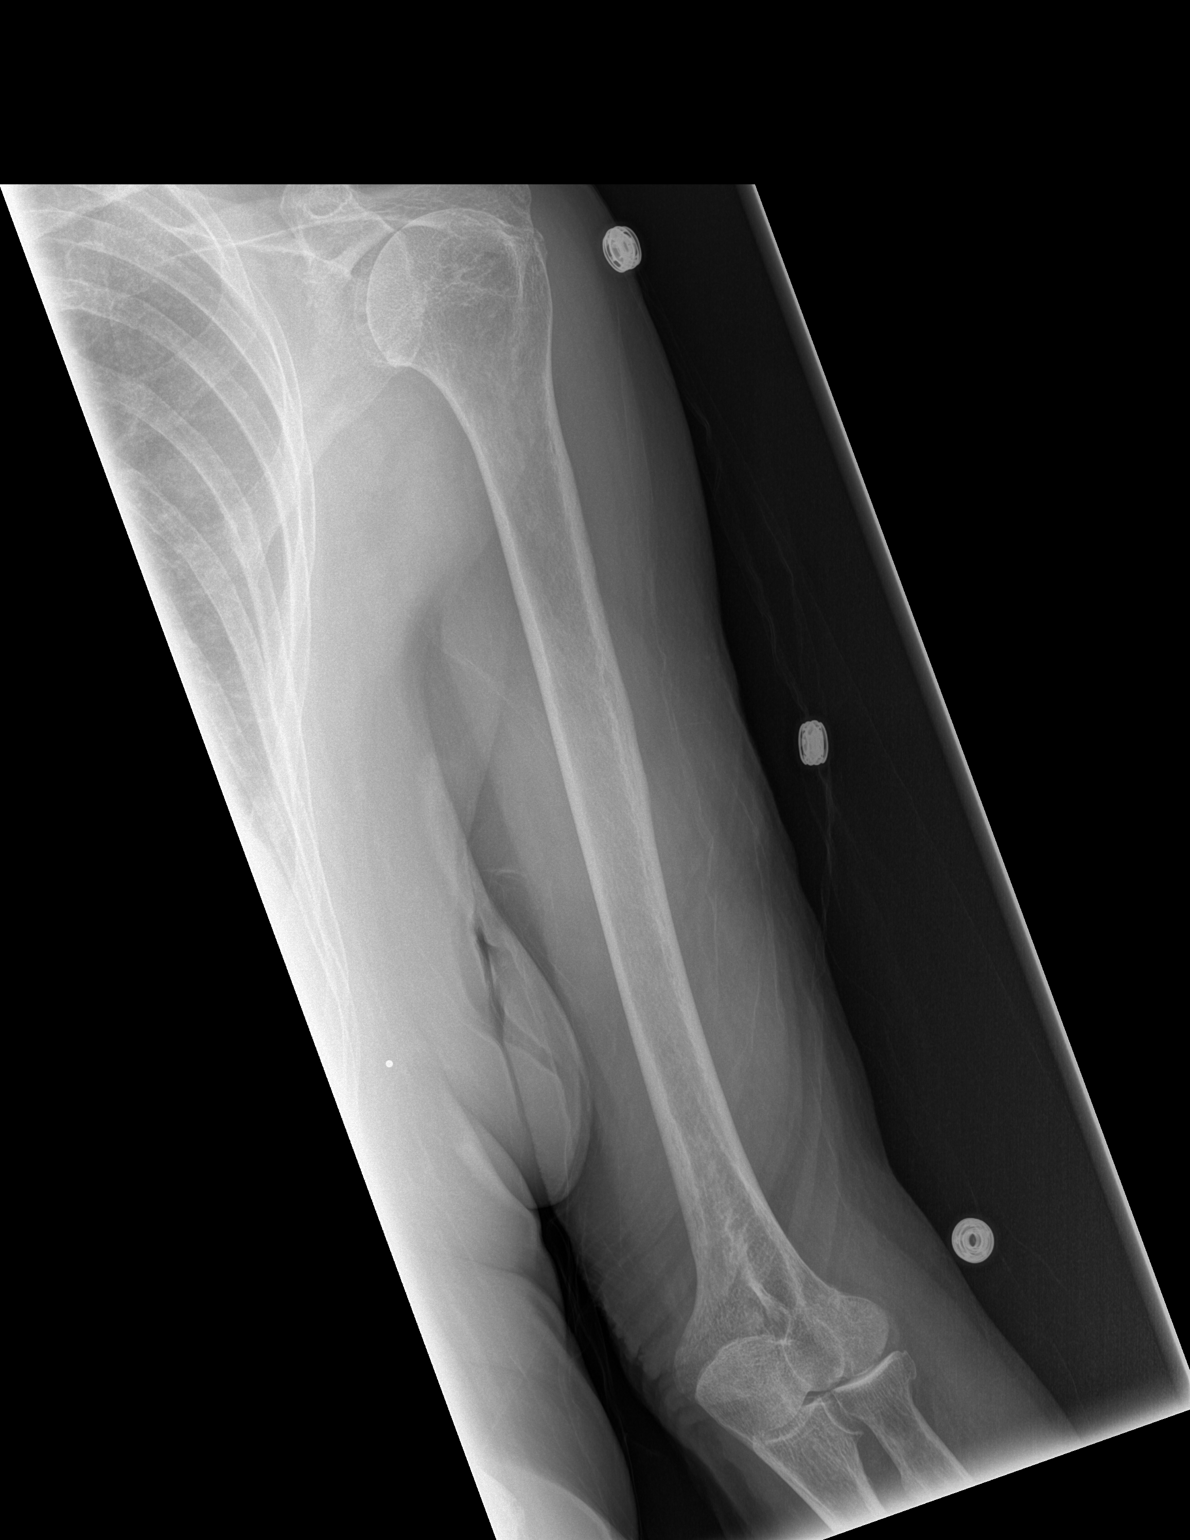

[w humerus lat left *]
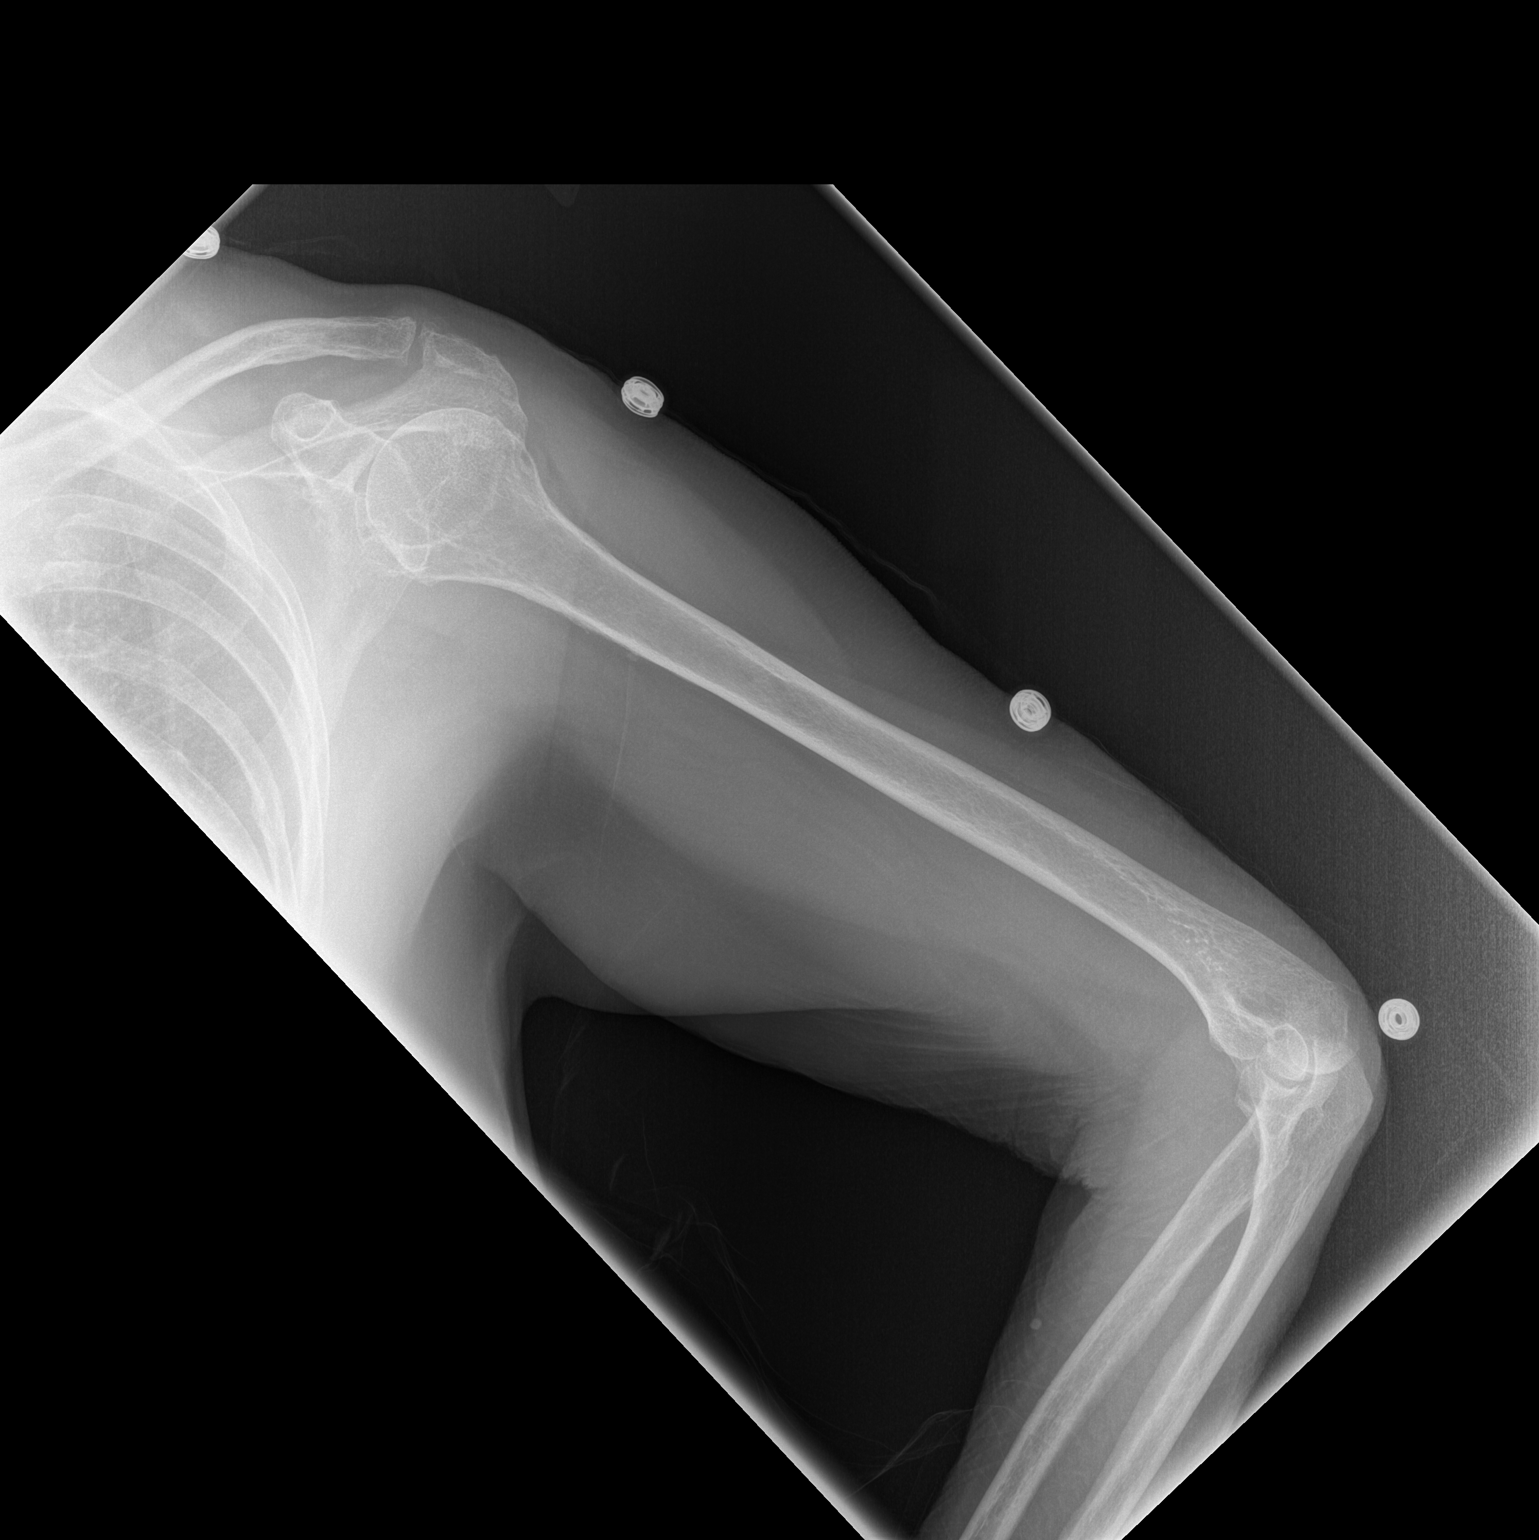

[2 of 2 positions shown; findings below may reference images not displayed]

FINDINGS: There is no acute bony or joint abnormality.
Degenerative disease of the acromioclavicular and glenohumeral
joints noted.
IMPRESSION: No acute finding.

## 2011-08-07 IMAGING — CR DG RIBS W/ CHEST 3+V*L*
3 series · 3 of 3 positions shown · non-contrast
Comparison: Chest 11/11/2006.

CLINICAL DATA: Status fall.  Pain and bruising the chest on the
left.

LEFT RIBS AND CHEST - 3+ VIEW

[w chest pa]
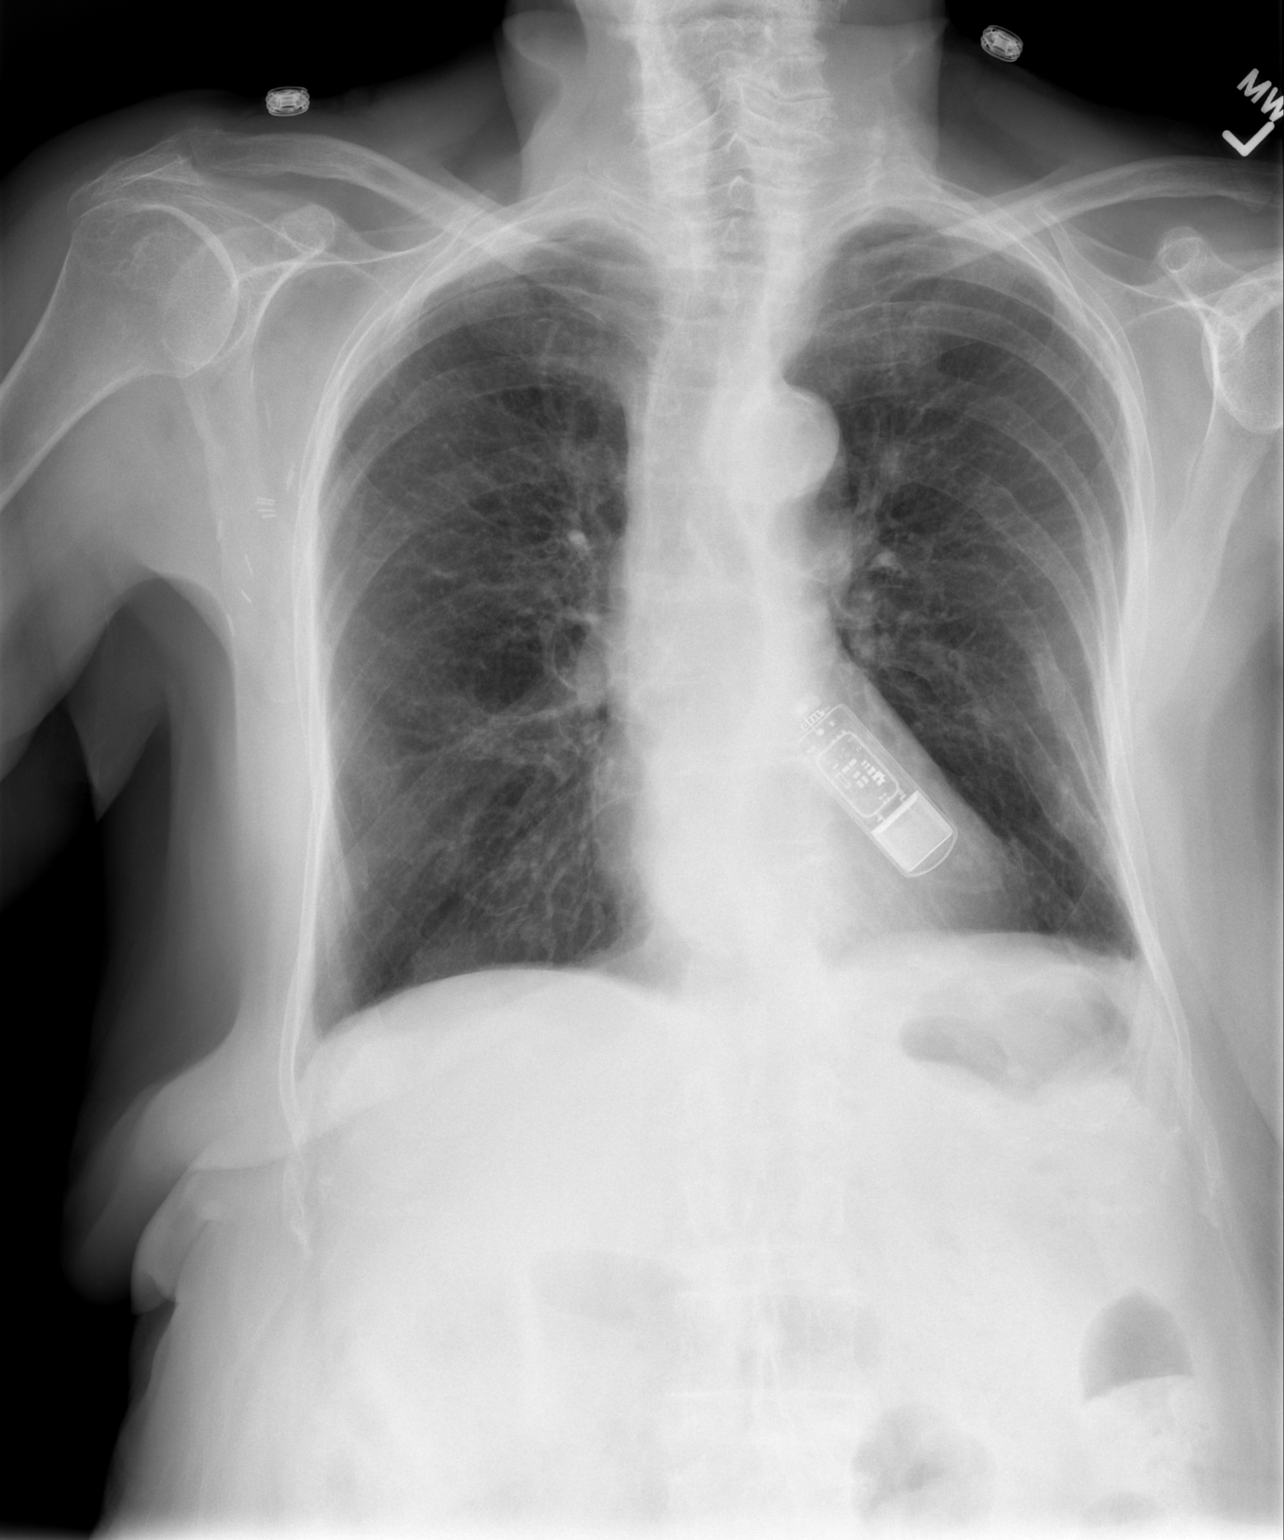

[w ribs ap/pa upper left]
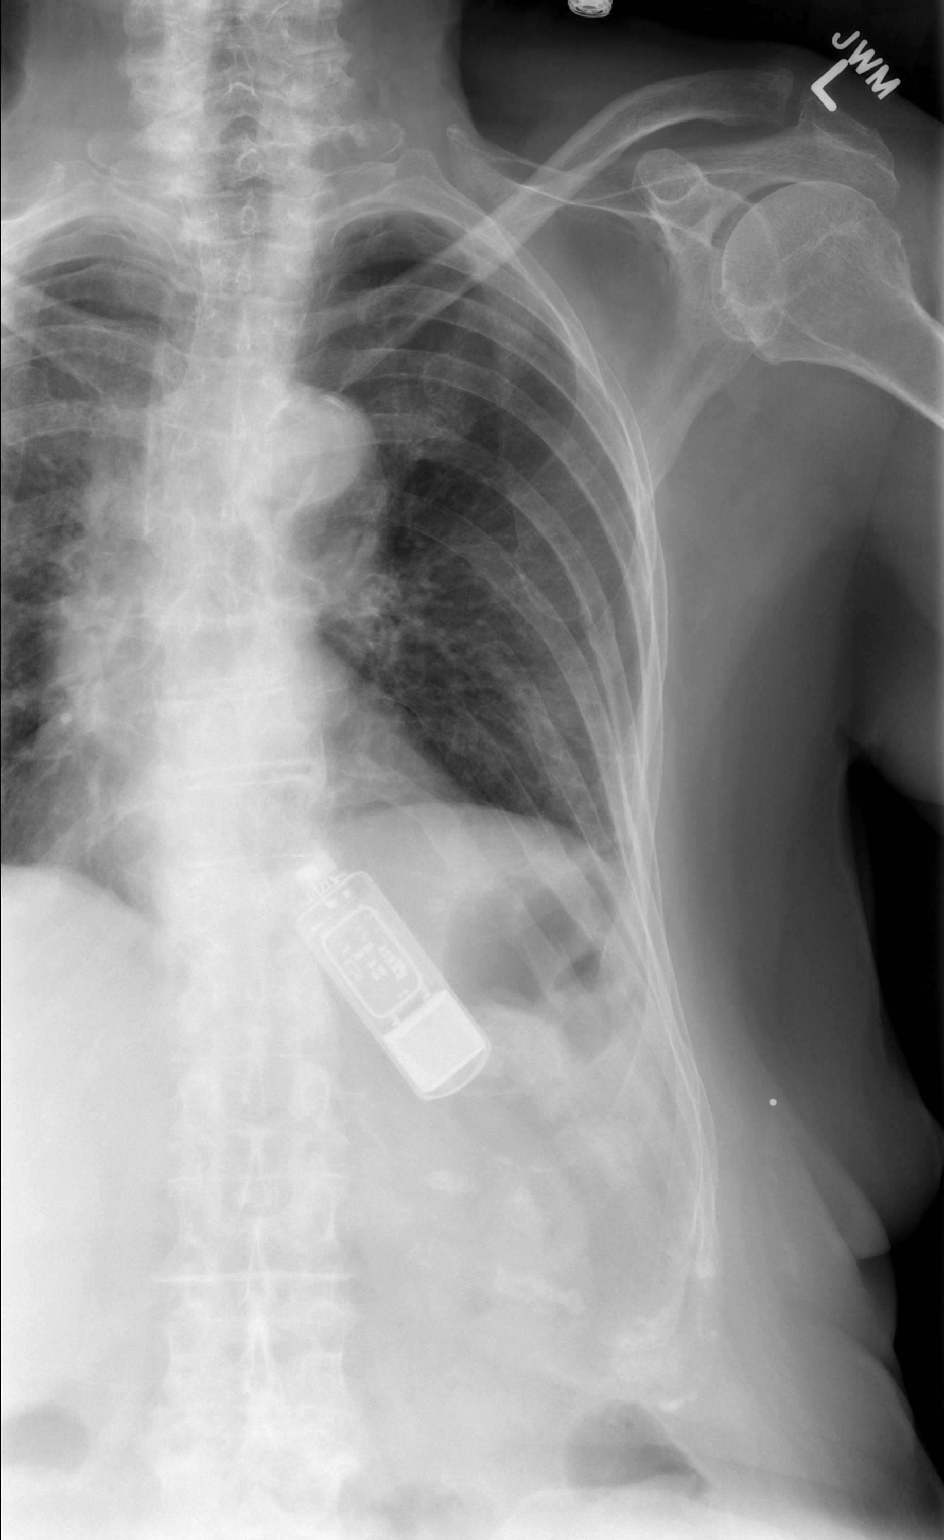

[w ribs oblique left]
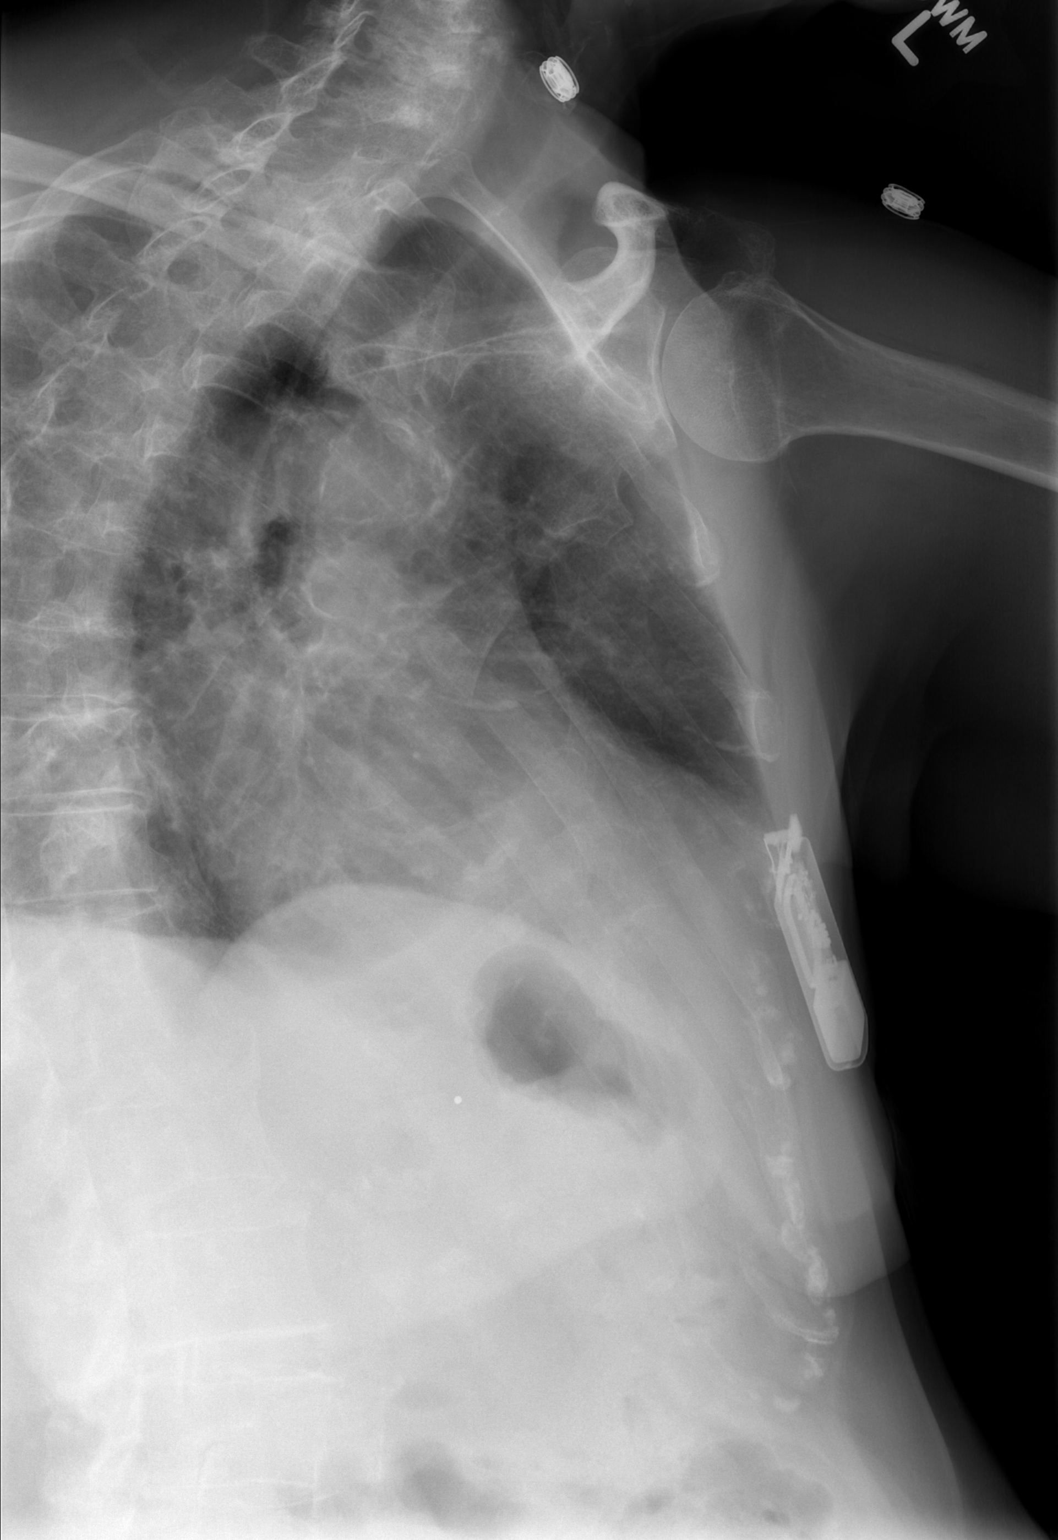

[3 of 3 positions shown; findings below may reference images not displayed]

FINDINGS: There is some mild blunting of the costophrenic angles
most compatible with scar.  Lungs are clear. No pneumothorax. Heart
size is normal.  The patient has a fracture of the left ninth rib.
Also seen are fractures of the left sixth and eighth ribs which
appear remote.  No other abnormality.
IMPRESSION: 1.  Acute appearing fracture left 9th rib.
2.  Scarring at the costophrenic angles.
3.  Remote left 6 and 8th rib fractures.

## 2012-03-05 ENCOUNTER — Encounter (INDEPENDENT_AMBULATORY_CARE_PROVIDER_SITE_OTHER): Payer: Self-pay

## 2012-03-09 ENCOUNTER — Ambulatory Visit (INDEPENDENT_AMBULATORY_CARE_PROVIDER_SITE_OTHER): Payer: Medicare Other | Admitting: General Surgery

## 2012-03-09 ENCOUNTER — Encounter (INDEPENDENT_AMBULATORY_CARE_PROVIDER_SITE_OTHER): Payer: Self-pay | Admitting: General Surgery

## 2012-03-09 VITALS — BP 132/72 | HR 75 | Temp 97.8°F | Resp 18 | Ht 59.0 in | Wt 123.8 lb

## 2012-03-09 DIAGNOSIS — K432 Incisional hernia without obstruction or gangrene: Secondary | ICD-10-CM

## 2012-03-09 NOTE — Progress Notes (Signed)
Subjective:     Patient ID: Jodi Price, female   DOB: November 27, 1919, 79 y.o.   MRN: [id number]  HPI This is a 61 yof who I saw in 2010 for an incisional hernia.  We got a ct at that point and discussed an incisional hernia repair.  We had a long conversation then about repair vs observation.  Since then this area has gotten bigger but she doesn't really describe any other problems.  She recently saw Dr. Kristen Loader and was noted to need this reduced in her office.  She was referred back to see me as this has gotten bigger.  She does not say it is out a lot and really doesn't have a lot of pain or discomfort with it. She has no n/v or any changes in her bowel movements.  She and her daughter have returned to discuss this.  Review of Systems  Constitutional: Negative for fever, chills and unexpected weight change.  HENT: Positive for hearing loss. Negative for congestion, sore throat, trouble swallowing and voice change.   Eyes: Negative for visual disturbance.  Respiratory: Negative for cough and wheezing.   Cardiovascular: Positive for leg swelling. Negative for chest pain and palpitations.  Gastrointestinal: Positive for abdominal distention. Negative for nausea, vomiting, abdominal pain, diarrhea, constipation, blood in stool and anal bleeding.  Genitourinary: Negative for hematuria, vaginal bleeding and difficulty urinating.  Musculoskeletal: Positive for arthralgias.  Skin: Negative for rash and wound.  Neurological: Positive for weakness. Negative for seizures, syncope and headaches.  Hematological: Negative for adenopathy. Bruises/bleeds easily.  Psychiatric/Behavioral: Negative for confusion.       Objective:   Physical Exam  Constitutional: She appears well-developed and well-nourished.  Abdominal: Soft. Bowel sounds are normal. She exhibits no distension.         Assessment:     Incisional hernia    Plan:     We had another long discussion.  The standard answer for incisional  hernia due to risks is to repair and we discussed a laparoscopic repair today with hospital stay, postop course as well as longterm risks for her.  If this is not repaired there is certainly a risk of incarceration and the possibility of emergency surgery which I told her could be fatal.  But operatively there is risk with her also and I think this is more in terms of cognitive function as well as living status. She currently is at home with help and I think at least for short term that would not be possible.  I told her I didn't think with surgery that she would make it all the way back to her current functional status.  After this long discussion and understanding the risks of no surgery we have decided to continue conservative approach.  She will call me if changes mind or wants to discuss any further.

## 2012-03-10 ENCOUNTER — Other Ambulatory Visit (INDEPENDENT_AMBULATORY_CARE_PROVIDER_SITE_OTHER): Payer: Self-pay | Admitting: General Surgery

## 2012-09-26 ENCOUNTER — Emergency Department (HOSPITAL_COMMUNITY): Payer: Medicare Other

## 2012-09-26 ENCOUNTER — Emergency Department (HOSPITAL_COMMUNITY)
Admission: EM | Admit: 2012-09-26 | Discharge: 2012-09-26 | Disposition: A | Discharge status: Home / Self Care | Payer: Medicare Other | Attending: Emergency Medicine | Admitting: Emergency Medicine

## 2012-09-26 DIAGNOSIS — E119 Type 2 diabetes mellitus without complications: Secondary | ICD-10-CM | POA: Insufficient documentation

## 2012-09-26 DIAGNOSIS — Z87440 Personal history of urinary (tract) infections: Secondary | ICD-10-CM | POA: Insufficient documentation

## 2012-09-26 DIAGNOSIS — R221 Localized swelling, mass and lump, neck: Secondary | ICD-10-CM | POA: Insufficient documentation

## 2012-09-26 DIAGNOSIS — W010XXA Fall on same level from slipping, tripping and stumbling without subsequent striking against object, initial encounter: Secondary | ICD-10-CM | POA: Insufficient documentation

## 2012-09-26 DIAGNOSIS — S0993XA Unspecified injury of face, initial encounter: Secondary | ICD-10-CM | POA: Insufficient documentation

## 2012-09-26 DIAGNOSIS — K219 Gastro-esophageal reflux disease without esophagitis: Secondary | ICD-10-CM | POA: Insufficient documentation

## 2012-09-26 DIAGNOSIS — Z859 Personal history of malignant neoplasm, unspecified: Secondary | ICD-10-CM | POA: Insufficient documentation

## 2012-09-26 DIAGNOSIS — Z8739 Personal history of other diseases of the musculoskeletal system and connective tissue: Secondary | ICD-10-CM | POA: Insufficient documentation

## 2012-09-26 DIAGNOSIS — S199XXA Unspecified injury of neck, initial encounter: Secondary | ICD-10-CM | POA: Insufficient documentation

## 2012-09-26 DIAGNOSIS — S4991XA Unspecified injury of right shoulder and upper arm, initial encounter: Secondary | ICD-10-CM

## 2012-09-26 DIAGNOSIS — Z8719 Personal history of other diseases of the digestive system: Secondary | ICD-10-CM | POA: Insufficient documentation

## 2012-09-26 DIAGNOSIS — Y9210 Unspecified residential institution as the place of occurrence of the external cause: Secondary | ICD-10-CM | POA: Insufficient documentation

## 2012-09-26 DIAGNOSIS — Z8701 Personal history of pneumonia (recurrent): Secondary | ICD-10-CM | POA: Insufficient documentation

## 2012-09-26 DIAGNOSIS — S6990XA Unspecified injury of unspecified wrist, hand and finger(s), initial encounter: Secondary | ICD-10-CM | POA: Insufficient documentation

## 2012-09-26 DIAGNOSIS — I10 Essential (primary) hypertension: Secondary | ICD-10-CM | POA: Insufficient documentation

## 2012-09-26 DIAGNOSIS — E039 Hypothyroidism, unspecified: Secondary | ICD-10-CM | POA: Insufficient documentation

## 2012-09-26 DIAGNOSIS — Y9301 Activity, walking, marching and hiking: Secondary | ICD-10-CM | POA: Insufficient documentation

## 2012-09-26 DIAGNOSIS — S59901A Unspecified injury of right elbow, initial encounter: Secondary | ICD-10-CM

## 2012-09-26 DIAGNOSIS — S59909A Unspecified injury of unspecified elbow, initial encounter: Secondary | ICD-10-CM | POA: Insufficient documentation

## 2012-09-26 DIAGNOSIS — S0083XA Contusion of other part of head, initial encounter: Secondary | ICD-10-CM

## 2012-09-26 DIAGNOSIS — Z7982 Long term (current) use of aspirin: Secondary | ICD-10-CM | POA: Insufficient documentation

## 2012-09-26 DIAGNOSIS — S46909A Unspecified injury of unspecified muscle, fascia and tendon at shoulder and upper arm level, unspecified arm, initial encounter: Secondary | ICD-10-CM | POA: Insufficient documentation

## 2012-09-26 DIAGNOSIS — R220 Localized swelling, mass and lump, head: Secondary | ICD-10-CM | POA: Insufficient documentation

## 2012-09-26 DIAGNOSIS — S0003XA Contusion of scalp, initial encounter: Secondary | ICD-10-CM | POA: Insufficient documentation

## 2012-09-26 DIAGNOSIS — F329 Major depressive disorder, single episode, unspecified: Secondary | ICD-10-CM | POA: Insufficient documentation

## 2012-09-26 DIAGNOSIS — W19XXXA Unspecified fall, initial encounter: Secondary | ICD-10-CM

## 2012-09-26 DIAGNOSIS — E079 Disorder of thyroid, unspecified: Secondary | ICD-10-CM | POA: Insufficient documentation

## 2012-09-26 DIAGNOSIS — S4980XA Other specified injuries of shoulder and upper arm, unspecified arm, initial encounter: Secondary | ICD-10-CM | POA: Insufficient documentation

## 2012-09-26 DIAGNOSIS — Z79899 Other long term (current) drug therapy: Secondary | ICD-10-CM | POA: Insufficient documentation

## 2012-09-26 DIAGNOSIS — F3289 Other specified depressive episodes: Secondary | ICD-10-CM | POA: Insufficient documentation

## 2012-09-26 NOTE — ED Notes (Signed)
PA at bedside.

## 2012-09-26 NOTE — ED Notes (Signed)
Per EMS, pt was faced down on hardwood floors. Pt stated that she lost her balance and fell. She is alert and oriented. Pt stated No LOC. Pt has hematoma on forehead with small laceration in the middle. She has a skin tear on her right arm near elbow. She has a bruise on her right knee. SBP was 132 palpable. HR 90. Resp 18 and CBG 123. Pt was placed in C-Collar. Pt has chronic neck pain. Complaining of pin in hematoma area and right arm. No IV. No cardiac or respiratory distress. Will continue to monitor.

## 2012-09-26 NOTE — ED Provider Notes (Signed)
CSN: [id number]     Arrival date & time 09/26/12  1053 History     First MD Initiated Contact with Patient 09/26/12 1055     Chief Complaint  Patient presents with  . Fall   (Consider location/radiation/quality/duration/timing/severity/associated sxs/prior Treatment) HPI Comments: Patient is a 79 year old female who presents via EMS from assisted living for a fall that occurred prior to arrival. Patient reports trying to walk without a walker, which she is normally dependent on, when she experienced a mechanical fall. Patient complains of pain to her head, neck, right shoulder, and right elbow. The pain is aching and severe without radiation. Movement and palpation makes the pain worse. Patient has not tried anything for pain. Patient denies LOC.    Past Medical History  Diagnosis Date  . Hypertension   . Thyroid disease   . Acid reflux disease   . Depression   . Diabetes mellitus   . Urinary tract infection   . Hypothyroidism   . Blood transfusion     hx reaction  . GERD (gastroesophageal reflux disease)     gastrophoresis  . H/O hiatal hernia   . Cancer   . Arthritis   . Pneumonia    Past Surgical History  Procedure Laterality Date  . Joint replacement    . Breast surgery    . Back surgery      1950  . Eye surgery      cataract   No family history on file. History  Substance Use Topics  . Smoking status: Never Smoker   . Smokeless tobacco: Never Used  . Alcohol Use: No   OB History   Grav Para Term Preterm Abortions TAB SAB Ect Mult Living                 Review of Systems  HENT: Positive for facial swelling and neck pain.   Musculoskeletal: Positive for arthralgias.  All other systems reviewed and are negative.    Allergies  Influenza vaccines and Pneumococcal vaccines  Home Medications   Current Outpatient Rx  Name  Route  Sig  Dispense  Refill  . amLODipine (NORVASC) 5 MG tablet   Oral   Take 5 mg by mouth daily.         Marland Kitchen aspirin 81 MG  tablet   Oral   Take 81 mg by mouth daily.         . DULoxetine (CYMBALTA) 60 MG capsule   Oral   Take 60 mg by mouth daily.         . furosemide (LASIX) 10 MG/ML solution   Oral   Take 10 mg by mouth daily as needed (for swelling).         Marland Kitchen levothyroxine (SYNTHROID, LEVOTHROID) 50 MCG tablet   Oral   Take 50 mcg by mouth daily.         . metoprolol (LOPRESSOR) 50 MG tablet   Oral   Take 50 mg by mouth daily.         Marland Kitchen omeprazole (PRILOSEC) 40 MG capsule   Oral   Take 40 mg by mouth daily.          BP 173/93  Pulse 93  Temp(Src) 98.3 F (36.8 C) (Oral)  Resp 18  SpO2 100% Physical Exam  Nursing note and vitals reviewed. Constitutional: She is oriented to person, place, and time. She appears well-developed and well-nourished. No distress.  HENT:  Head: Normocephalic and atraumatic.  Mouth/Throat: Oropharynx  is clear and moist. No oropharyngeal exudate.  Large hematoma on right forehead with overlying abrasion that is tender to palpate.   Eyes: Conjunctivae and EOM are normal. Pupils are equal, round, and reactive to light.  Neck:  C-collar in place  Cardiovascular: Normal rate and regular rhythm.  Exam reveals no gallop and no friction rub.   No murmur heard. Pulmonary/Chest: Effort normal and breath sounds normal. She has no wheezes. She has no rales. She exhibits no tenderness.  Abdominal: Soft. She exhibits no distension. There is no tenderness. There is no rebound and no guarding.  Musculoskeletal:  Right shoulder and right elbow ROM limited due to pain. Tenderness to palpation without obvious deformity of right shoulder and right elbow.   Neurological: She is alert and oriented to person, place, and time. Coordination normal.  Extremity strength and sensation equal and intact bilaterally. Speech is goal-oriented. Moves limbs without ataxia.   Skin: Skin is warm and dry.  Small abrasion to right forehead. Skin tear to right elbow without active  bleeding.   Psychiatric: She has a normal mood and affect. Her behavior is normal.    ED Course   Procedures (including critical care time)  Labs Reviewed - No data to display Ct Head Wo Contrast  09/26/2012   *RADIOLOGY REPORT*  Clinical Data:  Facial history of trauma from a fall with injury to the head and face.  CT HEAD WITHOUT CONTRAST CT MAXILLOFACIAL WITHOUT CONTRAST CT CERVICAL SPINE WITHOUT CONTRAST  Technique:  Multidetector CT imaging of the head, maxillofacial structures and cervical spine were performed using the standard protocol without intravenous contrast. Multiplanar CT image reconstructions of the maxillofacial structures were also generated.  Comparison:  Head and cervical spine CT scan 07/28/2011.  CT HEAD  Findings: Extensive right frontal scalp swelling with high attenuation material in the soft tissues compatible with a scalp contusion and hematoma.  Moderate cerebral and cerebellar atrophy. Extensive patchy and confluent areas of decreased attenuation throughout the deep and periventricular white matter of the cerebral hemispheres bilaterally, compatible with chronic microvascular ischemic disease.  Well-defined focus of low attenuation in the right cerebellar hemisphere, likely represent an old lacunar infarction. No acute displaced skull fractures are identified.  No acute intracranial abnormality.  Specifically, no evidence of acute post-traumatic intracranial hemorrhage, no definite regions of acute/subacute cerebral ischemia, no focal mass, mass effect, hydrocephalus or abnormal intra or extra-axial fluid collections.  The visualized paranasal sinuses and mastoids are well pneumatized.  IMPRESSION: 1.  Large right frontal scalp contusion/hematoma.  No underlying displaced skull fracture or findings to suggest significant acute intracranial trauma. 2.  Moderate cerebral and cerebellar atrophy with extensive chronic microvascular ischemic changes in the cerebral white matter,  and the old right cerebellar lacunar infarction, as above.  CT MAXILLOFACIAL  Findings:   Extensive swelling in the right frontal scalp with high attenuation material within the soft tissues, compatible with a large right frontal scalp contusion/hematoma.  No acute displaced facial bone fractures are identified.  Mandibular condyles are located bilaterally.  The pterygoid plates are intact.  A chronically impacted tooth is noted in the left maxilla.  IMPRESSION: 1.  Right frontal scalp contusion/hematoma again noted, without evidence of underlying displaced facial bone fracture.  CT CERVICAL SPINE  Findings:  No acute displaced fractures of the cervical spine. There is severe multilevel degenerative disc disease, most pronounced at C3-C4, C4-C5, C5-C6 and C6-C7.  At C3-C4 there appears to be partial bony fusion.  3 mm of  retrolisthesis of C4 upon C5.  2 mm of anterolisthesis of C2 upon C3.  Alignment is otherwise anatomic.  Multilevel facet arthropathy is noted. Prevertebral soft tissues are normal.  Visualized portions of the upper thorax are unremarkable.  IMPRESSION: 1.  No definite evidence of significant acute traumatic injury to the cervical spine. 2.  Extensive chronic multilevel degenerative disc disease and cervical spondylosis, as above.   Original Report Authenticated By: Trudie Reed, M.D.   Ct Cervical Spine Wo Contrast  09/26/2012   *RADIOLOGY REPORT*  Clinical Data:  Facial history of trauma from a fall with injury to the head and face.  CT HEAD WITHOUT CONTRAST CT MAXILLOFACIAL WITHOUT CONTRAST CT CERVICAL SPINE WITHOUT CONTRAST  Technique:  Multidetector CT imaging of the head, maxillofacial structures and cervical spine were performed using the standard protocol without intravenous contrast. Multiplanar CT image reconstructions of the maxillofacial structures were also generated.  Comparison:  Head and cervical spine CT scan 07/28/2011.  CT HEAD  Findings: Extensive right frontal scalp  swelling with high attenuation material in the soft tissues compatible with a scalp contusion and hematoma.  Moderate cerebral and cerebellar atrophy. Extensive patchy and confluent areas of decreased attenuation throughout the deep and periventricular white matter of the cerebral hemispheres bilaterally, compatible with chronic microvascular ischemic disease.  Well-defined focus of low attenuation in the right cerebellar hemisphere, likely represent an old lacunar infarction. No acute displaced skull fractures are identified.  No acute intracranial abnormality.  Specifically, no evidence of acute post-traumatic intracranial hemorrhage, no definite regions of acute/subacute cerebral ischemia, no focal mass, mass effect, hydrocephalus or abnormal intra or extra-axial fluid collections.  The visualized paranasal sinuses and mastoids are well pneumatized.  IMPRESSION: 1.  Large right frontal scalp contusion/hematoma.  No underlying displaced skull fracture or findings to suggest significant acute intracranial trauma. 2.  Moderate cerebral and cerebellar atrophy with extensive chronic microvascular ischemic changes in the cerebral white matter, and the old right cerebellar lacunar infarction, as above.  CT MAXILLOFACIAL  Findings:   Extensive swelling in the right frontal scalp with high attenuation material within the soft tissues, compatible with a large right frontal scalp contusion/hematoma.  No acute displaced facial bone fractures are identified.  Mandibular condyles are located bilaterally.  The pterygoid plates are intact.  A chronically impacted tooth is noted in the left maxilla.  IMPRESSION: 1.  Right frontal scalp contusion/hematoma again noted, without evidence of underlying displaced facial bone fracture.  CT CERVICAL SPINE  Findings:  No acute displaced fractures of the cervical spine. There is severe multilevel degenerative disc disease, most pronounced at C3-C4, C4-C5, C5-C6 and C6-C7.  At C3-C4 there  appears to be partial bony fusion.  3 mm of retrolisthesis of C4 upon C5.  2 mm of anterolisthesis of C2 upon C3.  Alignment is otherwise anatomic.  Multilevel facet arthropathy is noted. Prevertebral soft tissues are normal.  Visualized portions of the upper thorax are unremarkable.  IMPRESSION: 1.  No definite evidence of significant acute traumatic injury to the cervical spine. 2.  Extensive chronic multilevel degenerative disc disease and cervical spondylosis, as above.   Original Report Authenticated By: Trudie Reed, M.D.   Ct Maxillofacial Wo Cm  09/26/2012   *RADIOLOGY REPORT*  Clinical Data:  Facial history of trauma from a fall with injury to the head and face.  CT HEAD WITHOUT CONTRAST CT MAXILLOFACIAL WITHOUT CONTRAST CT CERVICAL SPINE WITHOUT CONTRAST  Technique:  Multidetector CT imaging of the head, maxillofacial structures and  cervical spine were performed using the standard protocol without intravenous contrast. Multiplanar CT image reconstructions of the maxillofacial structures were also generated.  Comparison:  Head and cervical spine CT scan 07/28/2011.  CT HEAD  Findings: Extensive right frontal scalp swelling with high attenuation material in the soft tissues compatible with a scalp contusion and hematoma.  Moderate cerebral and cerebellar atrophy. Extensive patchy and confluent areas of decreased attenuation throughout the deep and periventricular white matter of the cerebral hemispheres bilaterally, compatible with chronic microvascular ischemic disease.  Well-defined focus of low attenuation in the right cerebellar hemisphere, likely represent an old lacunar infarction. No acute displaced skull fractures are identified.  No acute intracranial abnormality.  Specifically, no evidence of acute post-traumatic intracranial hemorrhage, no definite regions of acute/subacute cerebral ischemia, no focal mass, mass effect, hydrocephalus or abnormal intra or extra-axial fluid collections.  The  visualized paranasal sinuses and mastoids are well pneumatized.  IMPRESSION: 1.  Large right frontal scalp contusion/hematoma.  No underlying displaced skull fracture or findings to suggest significant acute intracranial trauma. 2.  Moderate cerebral and cerebellar atrophy with extensive chronic microvascular ischemic changes in the cerebral white matter, and the old right cerebellar lacunar infarction, as above.  CT MAXILLOFACIAL  Findings:   Extensive swelling in the right frontal scalp with high attenuation material within the soft tissues, compatible with a large right frontal scalp contusion/hematoma.  No acute displaced facial bone fractures are identified.  Mandibular condyles are located bilaterally.  The pterygoid plates are intact.  A chronically impacted tooth is noted in the left maxilla.  IMPRESSION: 1.  Right frontal scalp contusion/hematoma again noted, without evidence of underlying displaced facial bone fracture.  CT CERVICAL SPINE  Findings:  No acute displaced fractures of the cervical spine. There is severe multilevel degenerative disc disease, most pronounced at C3-C4, C4-C5, C5-C6 and C6-C7.  At C3-C4 there appears to be partial bony fusion.  3 mm of retrolisthesis of C4 upon C5.  2 mm of anterolisthesis of C2 upon C3.  Alignment is otherwise anatomic.  Multilevel facet arthropathy is noted. Prevertebral soft tissues are normal.  Visualized portions of the upper thorax are unremarkable.  IMPRESSION: 1.  No definite evidence of significant acute traumatic injury to the cervical spine. 2.  Extensive chronic multilevel degenerative disc disease and cervical spondylosis, as above.   Original Report Authenticated By: Trudie Reed, M.D.   1. Fall, initial encounter   2. Traumatic hematoma of forehead, initial encounter   3. Right shoulder injury, initial encounter   4. Elbow injury, right, initial encounter     MDM  11:42 AM CT head, face, and cervical spine pending. Patient will have  xray of right elbow and right shoulder. No neuro deficits.    12:47 PM CT head, face and cervical spine unremarkable. Xrays pending.   2:18 PM Xrays unremarkable. Patient will be discharged without further evaluation. Patient instructed to take tylenol for pain as needed. Patient will have sling for right arm to wear for comfort. Wounds clean. Vitals stable and patient afebrile.   Emilia Beck, [place] 09/28/12 [phone]

## 2012-09-26 NOTE — ED Provider Notes (Signed)
Medical screening examination/treatment/procedure(s) were conducted as a shared visit with non-physician practitioner(s) and myself.  I personally evaluated the patient during the encounter  79 yo female who had a mechanical fall at home.  Has large frontal hematoma, but was alert and oriented on my exam.  She stated she was answering the door and did not use her walker.  Imaging negative.  Advised to use walker at all times and follow up with PCP.    Candyce Churn, MD 09/27/12 [phone]

## 2012-09-30 NOTE — ED Provider Notes (Signed)
Medical screening examination/treatment/procedure(s) were conducted as a shared visit with non-physician practitioner(s) and myself.  I personally evaluated the patient during the encounter.   Please see my separate note.    Candyce Churn, MD 09/30/12 1039

## 2013-09-03 ENCOUNTER — Emergency Department (HOSPITAL_BASED_OUTPATIENT_CLINIC_OR_DEPARTMENT_OTHER)
Admission: EM | Admit: 2013-09-03 | Discharge: 2013-09-03 | Disposition: A | Discharge status: Home / Self Care | Payer: Medicare Other | Attending: Emergency Medicine | Admitting: Emergency Medicine

## 2013-09-03 ENCOUNTER — Encounter (HOSPITAL_BASED_OUTPATIENT_CLINIC_OR_DEPARTMENT_OTHER): Payer: Self-pay | Admitting: Emergency Medicine

## 2013-09-03 DIAGNOSIS — M129 Arthropathy, unspecified: Secondary | ICD-10-CM | POA: Insufficient documentation

## 2013-09-03 DIAGNOSIS — Z79899 Other long term (current) drug therapy: Secondary | ICD-10-CM | POA: Diagnosis not present

## 2013-09-03 DIAGNOSIS — K219 Gastro-esophageal reflux disease without esophagitis: Secondary | ICD-10-CM | POA: Diagnosis not present

## 2013-09-03 DIAGNOSIS — N390 Urinary tract infection, site not specified: Secondary | ICD-10-CM | POA: Diagnosis not present

## 2013-09-03 DIAGNOSIS — N189 Chronic kidney disease, unspecified: Secondary | ICD-10-CM | POA: Diagnosis not present

## 2013-09-03 DIAGNOSIS — I129 Hypertensive chronic kidney disease with stage 1 through stage 4 chronic kidney disease, or unspecified chronic kidney disease: Secondary | ICD-10-CM | POA: Insufficient documentation

## 2013-09-03 DIAGNOSIS — F3289 Other specified depressive episodes: Secondary | ICD-10-CM | POA: Diagnosis not present

## 2013-09-03 DIAGNOSIS — I4891 Unspecified atrial fibrillation: Secondary | ICD-10-CM | POA: Insufficient documentation

## 2013-09-03 DIAGNOSIS — F329 Major depressive disorder, single episode, unspecified: Secondary | ICD-10-CM | POA: Insufficient documentation

## 2013-09-03 DIAGNOSIS — F0390 Unspecified dementia without behavioral disturbance: Secondary | ICD-10-CM | POA: Insufficient documentation

## 2013-09-03 DIAGNOSIS — E119 Type 2 diabetes mellitus without complications: Secondary | ICD-10-CM | POA: Diagnosis not present

## 2013-09-03 DIAGNOSIS — E039 Hypothyroidism, unspecified: Secondary | ICD-10-CM | POA: Insufficient documentation

## 2013-09-03 DIAGNOSIS — Z859 Personal history of malignant neoplasm, unspecified: Secondary | ICD-10-CM | POA: Diagnosis not present

## 2013-09-03 DIAGNOSIS — Z8701 Personal history of pneumonia (recurrent): Secondary | ICD-10-CM | POA: Insufficient documentation

## 2013-09-03 DIAGNOSIS — Z8669 Personal history of other diseases of the nervous system and sense organs: Secondary | ICD-10-CM | POA: Insufficient documentation

## 2013-09-03 DIAGNOSIS — R319 Hematuria, unspecified: Secondary | ICD-10-CM | POA: Diagnosis present

## 2013-09-03 HISTORY — DX: Chronic kidney disease, unspecified: N18.9

## 2013-09-03 HISTORY — DX: Polyneuropathy, unspecified: G62.9

## 2013-09-03 HISTORY — DX: Hyperlipidemia, unspecified: E78.5

## 2013-09-03 HISTORY — DX: Unspecified dementia, unspecified severity, without behavioral disturbance, psychotic disturbance, mood disturbance, and anxiety: F03.90

## 2013-09-03 HISTORY — DX: Unspecified atrial fibrillation: I48.91

## 2013-09-03 LAB — URINALYSIS, ROUTINE W REFLEX MICROSCOPIC
Bilirubin Urine: NEGATIVE
Glucose, UA: NEGATIVE mg/dL
Ketones, ur: NEGATIVE mg/dL
NITRITE: NEGATIVE
Protein, ur: 30 mg/dL — AB
SPECIFIC GRAVITY, URINE: 1.008 (ref 1.005–1.030)
UROBILINOGEN UA: 1 mg/dL (ref 0.0–1.0)
pH: 7.5 (ref 5.0–8.0)

## 2013-09-03 LAB — URINE MICROSCOPIC-ADD ON

## 2013-09-03 MED ORDER — CIPROFLOXACIN HCL 500 MG PO TABS: 500.0000 mg | ORAL_TABLET | Freq: Two times a day (BID) | ORAL | Status: DC

## 2013-09-03 NOTE — ED Provider Notes (Signed)
CSN: [id number]     Arrival date & time 09/03/13  1233 History   First MD Initiated Contact with Patient 09/03/13 1251     Chief Complaint  Patient presents with  . Hematuria     (Consider location/radiation/quality/duration/timing/severity/associated sxs/prior Treatment) HPI Comments: Pt states 2 weeks ago had 2 days of dysuria, frequency and urgency with mild hematuria that resolved spontaneously and sx recurred today  Patient is a 80 y.o. female presenting with hematuria. The history is provided by the patient.  Hematuria This is a recurrent problem. The current episode started 3 to 5 hours ago. The problem has been gradually worsening. Pertinent negatives include no chest pain and no abdominal pain. Exacerbated by: urinating. Nothing relieves the symptoms. She has tried nothing for the symptoms.    Past Medical History  Diagnosis Date  . Hypertension   . Thyroid disease   . Acid reflux disease   . Depression   . Diabetes mellitus   . Urinary tract infection   . Hypothyroidism   . Blood transfusion     hx reaction  . GERD (gastroesophageal reflux disease)     gastrophoresis  . H/O hiatal hernia   . Cancer   . Arthritis   . Pneumonia   . Atrial fibrillation   . Chronic kidney disease   . Dementia   . Hyperlipidemia   . Peripheral neuropathy    Past Surgical History  Procedure Laterality Date  . Joint replacement    . Breast surgery    . Back surgery      1950  . Eye surgery      cataract   No family history on file. History  Substance Use Topics  . Smoking status: Never Smoker   . Smokeless tobacco: Never Used  . Alcohol Use: No   OB History   Grav Para Term Preterm Abortions TAB SAB Ect Mult Living                 Review of Systems  Constitutional: Negative for fever.  Respiratory: Negative for cough.   Cardiovascular: Negative for chest pain.  Gastrointestinal: Negative for nausea, vomiting and abdominal pain.  Genitourinary: Positive for dysuria,  urgency, frequency and hematuria.  Neurological: Negative for weakness.  All other systems reviewed and are negative.     Allergies  Influenza vaccines and Pneumococcal vaccines  Home Medications   Prior to Admission medications   Medication Sig Start Date End Date Taking? Authorizing Provider  FLUoxetine (PROZAC) 20 MG capsule Take 20 mg by mouth daily.   Yes Historical Provider, MD  gabapentin (NEURONTIN) 100 MG capsule Take 100 mg by mouth 3 (three) times daily.   Yes Historical Provider, MD  metaxalone (SKELAXIN) 800 MG tablet Take 800 mg by mouth 3 (three) times daily.   Yes Historical Provider, MD  nitrofurantoin, macrocrystal-monohydrate, (MACROBID) 100 MG capsule Take 100 mg by mouth 2 (two) times daily.   Yes Historical Provider, MD  Potassium (POTASSIMIN PO) Take by mouth.   Yes Historical Provider, MD  potassium chloride (K-DUR) 10 MEQ tablet Take 10 mEq by mouth daily.   Yes Historical Provider, MD  traMADol (ULTRAM) 50 MG tablet Take by mouth every 6 (six) hours as needed.   Yes Historical Provider, MD  amLODipine (NORVASC) 5 MG tablet Take 5 mg by mouth daily.    Historical Provider, MD  aspirin 81 MG tablet Take 81 mg by mouth daily.    Historical Provider, MD  DULoxetine (CYMBALTA) 60 MG  capsule Take 60 mg by mouth daily.    Historical Provider, MD  furosemide (LASIX) 10 MG/ML solution Take 10 mg by mouth daily as needed (for swelling).    Historical Provider, MD  levothyroxine (SYNTHROID, LEVOTHROID) 50 MCG tablet Take 50 mcg by mouth daily.    Historical Provider, MD  metoprolol (LOPRESSOR) 50 MG tablet Take 50 mg by mouth daily.    Historical Provider, MD  omeprazole (PRILOSEC) 40 MG capsule Take 40 mg by mouth daily.    Historical Provider, MD   BP 159/81  Pulse 78  Temp(Src) 98.5 F (36.9 C) (Oral)  Ht 4\' 11"  (1.499 m)  Wt 129 lb (58.514 kg)  BMI 26.04 kg/m2  SpO2 95% Physical Exam  Nursing note and vitals reviewed. Constitutional: She is oriented to  person, place, and time. She appears well-developed and well-nourished. No distress.  HENT:  Head: Normocephalic and atraumatic.  Mouth/Throat: Oropharynx is clear and moist.  Eyes: Conjunctivae and EOM are normal. Pupils are equal, round, and reactive to light.  Neck: Normal range of motion. Neck supple.  Cardiovascular: Normal rate and intact distal pulses.  An irregularly irregular rhythm present.  No murmur heard. Pulmonary/Chest: Effort normal and breath sounds normal. No respiratory distress. She has no wheezes.  Fine crackles throughout lung fields  Abdominal: Soft. She exhibits no distension. There is no tenderness. There is no rebound, no guarding and no CVA tenderness.  Musculoskeletal: Normal range of motion. She exhibits no edema and no tenderness.  Neurological: She is alert and oriented to person, place, and time.  Skin: Skin is warm and dry. No rash noted. No erythema.  Psychiatric: She has a normal mood and affect. Her behavior is normal.    ED Course  Procedures (including critical care time) Labs Review Labs Reviewed  URINALYSIS, ROUTINE W REFLEX MICROSCOPIC - Abnormal; Notable for the following:    APPearance CLOUDY (*)    Hgb urine dipstick LARGE (*)    Protein, ur 30 (*)    Leukocytes, UA LARGE (*)    All other components within normal limits  URINE MICROSCOPIC-ADD ON - Abnormal; Notable for the following:    Bacteria, UA FEW (*)    All other components within normal limits    Imaging Review No results found.   EKG Interpretation None      MDM   Final diagnoses:  None   Patient here due to dysuria and hematuria diarrhea started today. Patient had 2 days of similar symptoms approximately 2 weeks ago that resolved spontaneously. She denies any symptoms concerning for pyelonephritis.  No sx suggestive of kidney stone.  Denies any systemic symptoms and otherwise feels normal. UA pending for further evaluation  2:08 PM UA consistent with UTI.  Blanchie Dessert, MD 09/03/13 1408

## 2013-09-03 NOTE — ED Notes (Signed)
Hematuria this am. Dysuria 2 weeks ago that went away. Yesterday she had dysuria and small output.

## 2013-09-30 ENCOUNTER — Emergency Department (HOSPITAL_BASED_OUTPATIENT_CLINIC_OR_DEPARTMENT_OTHER): Payer: Medicare Other

## 2013-09-30 ENCOUNTER — Encounter (HOSPITAL_BASED_OUTPATIENT_CLINIC_OR_DEPARTMENT_OTHER): Payer: Self-pay | Admitting: Emergency Medicine

## 2013-09-30 ENCOUNTER — Emergency Department (HOSPITAL_BASED_OUTPATIENT_CLINIC_OR_DEPARTMENT_OTHER)
Admission: EM | Admit: 2013-09-30 | Discharge: 2013-09-30 | Disposition: A | Discharge status: Home / Self Care | Payer: Medicare Other | Attending: Emergency Medicine | Admitting: Emergency Medicine

## 2013-09-30 DIAGNOSIS — M129 Arthropathy, unspecified: Secondary | ICD-10-CM | POA: Diagnosis not present

## 2013-09-30 DIAGNOSIS — Z8701 Personal history of pneumonia (recurrent): Secondary | ICD-10-CM | POA: Insufficient documentation

## 2013-09-30 DIAGNOSIS — F3289 Other specified depressive episodes: Secondary | ICD-10-CM | POA: Insufficient documentation

## 2013-09-30 DIAGNOSIS — M79609 Pain in unspecified limb: Secondary | ICD-10-CM | POA: Diagnosis not present

## 2013-09-30 DIAGNOSIS — E119 Type 2 diabetes mellitus without complications: Secondary | ICD-10-CM | POA: Insufficient documentation

## 2013-09-30 DIAGNOSIS — Z79899 Other long term (current) drug therapy: Secondary | ICD-10-CM | POA: Insufficient documentation

## 2013-09-30 DIAGNOSIS — Z87440 Personal history of urinary (tract) infections: Secondary | ICD-10-CM | POA: Diagnosis not present

## 2013-09-30 DIAGNOSIS — N189 Chronic kidney disease, unspecified: Secondary | ICD-10-CM | POA: Insufficient documentation

## 2013-09-30 DIAGNOSIS — I4891 Unspecified atrial fibrillation: Secondary | ICD-10-CM | POA: Diagnosis not present

## 2013-09-30 DIAGNOSIS — K219 Gastro-esophageal reflux disease without esophagitis: Secondary | ICD-10-CM | POA: Diagnosis not present

## 2013-09-30 DIAGNOSIS — M7989 Other specified soft tissue disorders: Secondary | ICD-10-CM | POA: Diagnosis not present

## 2013-09-30 DIAGNOSIS — G608 Other hereditary and idiopathic neuropathies: Secondary | ICD-10-CM | POA: Insufficient documentation

## 2013-09-30 DIAGNOSIS — Z7982 Long term (current) use of aspirin: Secondary | ICD-10-CM | POA: Diagnosis not present

## 2013-09-30 DIAGNOSIS — F329 Major depressive disorder, single episode, unspecified: Secondary | ICD-10-CM | POA: Diagnosis not present

## 2013-09-30 DIAGNOSIS — F0390 Unspecified dementia without behavioral disturbance: Secondary | ICD-10-CM | POA: Insufficient documentation

## 2013-09-30 DIAGNOSIS — E039 Hypothyroidism, unspecified: Secondary | ICD-10-CM | POA: Insufficient documentation

## 2013-09-30 DIAGNOSIS — Z859 Personal history of malignant neoplasm, unspecified: Secondary | ICD-10-CM | POA: Diagnosis not present

## 2013-09-30 DIAGNOSIS — M79606 Pain in leg, unspecified: Secondary | ICD-10-CM

## 2013-09-30 DIAGNOSIS — I129 Hypertensive chronic kidney disease with stage 1 through stage 4 chronic kidney disease, or unspecified chronic kidney disease: Secondary | ICD-10-CM | POA: Insufficient documentation

## 2013-09-30 NOTE — ED Provider Notes (Signed)
CSN: [id number]     Arrival date & time 09/30/13  1240 History   First MD Initiated Contact with Patient 09/30/13 1257     Chief Complaint  Patient presents with  . Leg Pain     (Consider location/radiation/quality/duration/timing/severity/associated sxs/prior Treatment) HPI Pt presents with c/o pain in right lower extremity - mostly in calf and achilles tendon area.  Pt also has some mild pain in left lower extremity but right side is most prominent.  Very mild swelling noted at ankle on right side, but family states this is chronic for her. No injury, no chest pain or shortness of breath.  she first noted symptoms this morning when she awoke from sleep.  States that palpation makes it worse and rest makes it better.  There are no other associated systemic symptoms, there are no other alleviating or modifying factors.   Past Medical History  Diagnosis Date  . Hypertension   . Thyroid disease   . Acid reflux disease   . Depression   . Diabetes mellitus   . Urinary tract infection   . Hypothyroidism   . Blood transfusion     hx reaction  . GERD (gastroesophageal reflux disease)     gastrophoresis  . H/O hiatal hernia   . Cancer   . Arthritis   . Pneumonia   . Atrial fibrillation   . Chronic kidney disease   . Dementia   . Hyperlipidemia   . Peripheral neuropathy    Past Surgical History  Procedure Laterality Date  . Joint replacement    . Breast surgery    . Back surgery      1950  . Eye surgery      cataract   No family history on file. History  Substance Use Topics  . Smoking status: Never Smoker   . Smokeless tobacco: Never Used  . Alcohol Use: No   OB History   Grav Para Term Preterm Abortions TAB SAB Ect Mult Living                 Review of Systems ROS reviewed and all otherwise negative except for mentioned in HPI    Allergies  Influenza vaccines and Pneumococcal vaccines  Home Medications   Prior to Admission medications   Medication Sig  Start Date End Date Taking? Authorizing Provider  amLODipine (NORVASC) 5 MG tablet Take 5 mg by mouth daily.    Historical Provider, MD  aspirin 81 MG tablet Take 81 mg by mouth daily.    Historical Provider, MD  ciprofloxacin (CIPRO) 500 MG tablet Take 1 tablet (500 mg total) by mouth 2 (two) times daily. 09/03/13   Blanchie Dessert, MD  DULoxetine (CYMBALTA) 60 MG capsule Take 60 mg by mouth daily.    Historical Provider, MD  FLUoxetine (PROZAC) 20 MG capsule Take 20 mg by mouth daily.    Historical Provider, MD  furosemide (LASIX) 10 MG/ML solution Take 10 mg by mouth daily as needed (for swelling).    Historical Provider, MD  gabapentin (NEURONTIN) 100 MG capsule Take 100 mg by mouth 3 (three) times daily.    Historical Provider, MD  levothyroxine (SYNTHROID, LEVOTHROID) 50 MCG tablet Take 50 mcg by mouth daily.    Historical Provider, MD  metaxalone (SKELAXIN) 800 MG tablet Take 800 mg by mouth 3 (three) times daily.    Historical Provider, MD  metoprolol (LOPRESSOR) 50 MG tablet Take 50 mg by mouth daily.    Historical Provider, MD  nitrofurantoin, macrocrystal-monohydrate, (  MACROBID) 100 MG capsule Take 100 mg by mouth 2 (two) times daily.    Historical Provider, MD  omeprazole (PRILOSEC) 40 MG capsule Take 40 mg by mouth daily.    Historical Provider, MD  Potassium (POTASSIMIN PO) Take by mouth.    Historical Provider, MD  potassium chloride (K-DUR) 10 MEQ tablet Take 10 mEq by mouth daily.    Historical Provider, MD  traMADol (ULTRAM) 50 MG tablet Take by mouth every 6 (six) hours as needed.    Historical Provider, MD   BP 158/73  Pulse 66  Temp(Src) 98.3 F (36.8 C) (Oral)  Resp 20  Ht 4\' 11"  (1.499 m)  Wt 130 lb (58.968 kg)  BMI 26.24 kg/m2  SpO2 98% Vitals reviewed Physical Exam Physical Examination: General appearance - alert, well appearing, and in no distress Mental status - alert, oriented to person, place, and time Eyes - no scleral icterus, no conjunctival  injection Mouth - mucous membranes moist, pharynx normal without lesions Chest - clear to auscultation, no wheezes, rales or rhonchi, symmetric air entry Heart - normal rate, regular rhythm, normal S1, S2, no murmurs, rubs, clicks or gallops Neurological - alert, oriented, normal speech, no focal findings or movement disorder noted Musculoskeletal - ttp over achilles tendon on right and very mild ttp over calf of right lower extremity, no palpable cords, no joint tenderness, deformity or swelling Extremities - peripheral pulses normal, mild right sided pedal edema, no clubbing or cyanosis Skin - normal coloration and turgor, no rashes  ED Course  Procedures (including critical care time) Labs Review Labs Reviewed - No data to display  Imaging Review US Venous Img Lower Unilateral Right  09/30/2013   CLINICAL DATA:  Right leg pain and swelling  EXAM: Right LOWER EXTREMITY VENOUS DOPPLER ULTRASOUND  TECHNIQUE: Gray-scale sonography with graded compression, as well as color Doppler and duplex ultrasound were performed to evaluate the lower extremity deep venous systems from the level of the common femoral vein and including the common femoral, femoral, profunda femoral, popliteal and calf veins including the posterior tibial, peroneal and gastrocnemius veins when visible. The superficial great saphenous vein was also interrogated. Spectral Doppler was utilized to evaluate flow at rest and with distal augmentation maneuvers in the common femoral, femoral and popliteal veins.  COMPARISON:  None.  FINDINGS: Common Femoral Vein: No evidence of thrombus. Normal compressibility, respiratory phasicity and response to augmentation.  Saphenofemoral Junction: No evidence of thrombus. Normal compressibility and flow on color Doppler imaging.  Profunda Femoral Vein: No evidence of thrombus. Normal compressibility and flow on color Doppler imaging.  Femoral Vein: No evidence of thrombus. Normal compressibility,  respiratory phasicity and response to augmentation.  Popliteal Vein: No evidence of thrombus. Normal compressibility, respiratory phasicity and response to augmentation.  Calf Veins: No evidence of thrombus. Normal compressibility and flow on color Doppler imaging.  Superficial Great Saphenous Vein: No evidence of thrombus. Normal compressibility and flow on color Doppler imaging.  Venous Reflux:  None.  Other Findings:  None.  IMPRESSION: No evidence of deep venous thrombosis.   Electronically Signed   By: Inez Catalina M.D.   On: 09/30/2013 14:59     EKG Interpretation None      MDM   Final diagnoses:  Pain of lower extremity, unspecified laterality    Pt presenting with c/o pain in right lower extremity in calf and ankle.  Venous duplex shows no DVT. No injury or bony point tenderness to warrant xray imaging.  No signs  of cellulties or other infection.  Pt referred to followup with PMD.  Discharged with strict return precautions.  Pt agreeable with plan.    Threasa Beards, MD 10/01/13 [phone]

## 2013-09-30 NOTE — Discharge Instructions (Signed)
Return to the ED with any concerns including increased pain, swelling, discoloration of foot or toes, weakness of legs, decreased level of alertness/lethargy, or any other alarming symptoms

## 2013-09-30 NOTE — ED Notes (Signed)
Bilateral lower extremity pain that started this am.  Denies injry. Right leg pain is worse than left.

## 2014-01-18 ENCOUNTER — Inpatient Hospital Stay (HOSPITAL_COMMUNITY)
Admission: EM | Admit: 2014-01-18 | Discharge: 2014-01-21 | DRG: 193 | Disposition: A | Discharge status: Home Health | Payer: Medicare Other | Attending: Internal Medicine | Admitting: Internal Medicine

## 2014-01-18 ENCOUNTER — Emergency Department (HOSPITAL_COMMUNITY): Payer: Medicare Other

## 2014-01-18 ENCOUNTER — Encounter (HOSPITAL_COMMUNITY): Payer: Self-pay

## 2014-01-18 DIAGNOSIS — E785 Hyperlipidemia, unspecified: Secondary | ICD-10-CM | POA: Diagnosis present

## 2014-01-18 DIAGNOSIS — Z6824 Body mass index (BMI) 24.0-24.9, adult: Secondary | ICD-10-CM

## 2014-01-18 DIAGNOSIS — I482 Chronic atrial fibrillation, unspecified: Secondary | ICD-10-CM | POA: Diagnosis present

## 2014-01-18 DIAGNOSIS — N189 Chronic kidney disease, unspecified: Secondary | ICD-10-CM | POA: Diagnosis present

## 2014-01-18 DIAGNOSIS — M1990 Unspecified osteoarthritis, unspecified site: Secondary | ICD-10-CM | POA: Diagnosis present

## 2014-01-18 DIAGNOSIS — Z66 Do not resuscitate: Secondary | ICD-10-CM | POA: Diagnosis present

## 2014-01-18 DIAGNOSIS — K5900 Constipation, unspecified: Secondary | ICD-10-CM | POA: Diagnosis present

## 2014-01-18 DIAGNOSIS — R0602 Shortness of breath: Secondary | ICD-10-CM

## 2014-01-18 DIAGNOSIS — F0390 Unspecified dementia without behavioral disturbance: Secondary | ICD-10-CM | POA: Diagnosis present

## 2014-01-18 DIAGNOSIS — E440 Moderate protein-calorie malnutrition: Secondary | ICD-10-CM | POA: Diagnosis present

## 2014-01-18 DIAGNOSIS — E039 Hypothyroidism, unspecified: Secondary | ICD-10-CM | POA: Diagnosis present

## 2014-01-18 DIAGNOSIS — G629 Polyneuropathy, unspecified: Secondary | ICD-10-CM | POA: Diagnosis present

## 2014-01-18 DIAGNOSIS — I10 Essential (primary) hypertension: Secondary | ICD-10-CM | POA: Diagnosis present

## 2014-01-18 DIAGNOSIS — K219 Gastro-esophageal reflux disease without esophagitis: Secondary | ICD-10-CM | POA: Diagnosis present

## 2014-01-18 DIAGNOSIS — Z79899 Other long term (current) drug therapy: Secondary | ICD-10-CM

## 2014-01-18 DIAGNOSIS — I129 Hypertensive chronic kidney disease with stage 1 through stage 4 chronic kidney disease, or unspecified chronic kidney disease: Secondary | ICD-10-CM | POA: Diagnosis present

## 2014-01-18 DIAGNOSIS — E119 Type 2 diabetes mellitus without complications: Secondary | ICD-10-CM

## 2014-01-18 DIAGNOSIS — Z96653 Presence of artificial knee joint, bilateral: Secondary | ICD-10-CM | POA: Diagnosis present

## 2014-01-18 DIAGNOSIS — Z7982 Long term (current) use of aspirin: Secondary | ICD-10-CM | POA: Diagnosis not present

## 2014-01-18 DIAGNOSIS — R110 Nausea: Secondary | ICD-10-CM | POA: Insufficient documentation

## 2014-01-18 DIAGNOSIS — F329 Major depressive disorder, single episode, unspecified: Secondary | ICD-10-CM | POA: Diagnosis present

## 2014-01-18 DIAGNOSIS — Y95 Nosocomial condition: Secondary | ICD-10-CM | POA: Diagnosis present

## 2014-01-18 DIAGNOSIS — J9620 Acute and chronic respiratory failure, unspecified whether with hypoxia or hypercapnia: Secondary | ICD-10-CM | POA: Diagnosis present

## 2014-01-18 DIAGNOSIS — E876 Hypokalemia: Secondary | ICD-10-CM | POA: Diagnosis present

## 2014-01-18 DIAGNOSIS — E089 Diabetes mellitus due to underlying condition without complications: Secondary | ICD-10-CM | POA: Diagnosis not present

## 2014-01-18 DIAGNOSIS — J189 Pneumonia, unspecified organism: Principal | ICD-10-CM | POA: Diagnosis present

## 2014-01-18 HISTORY — DX: Umbilical hernia without obstruction or gangrene: K42.9

## 2014-01-18 LAB — BASIC METABOLIC PANEL
Anion gap: 13 (ref 5–15)
BUN: 10 mg/dL (ref 6–23)
CALCIUM: 9.6 mg/dL (ref 8.4–10.5)
CO2: 25 meq/L (ref 19–32)
Chloride: 100 mEq/L (ref 96–112)
Creatinine, Ser: 0.59 mg/dL (ref 0.50–1.10)
GFR calc Af Amer: 89 mL/min — ABNORMAL LOW (ref >90)
GFR calc non Af Amer: 77 mL/min — ABNORMAL LOW (ref >90)
GLUCOSE: 133 mg/dL — AB (ref 70–99)
POTASSIUM: 3.5 meq/L — AB (ref 3.7–5.3)
SODIUM: 138 meq/L (ref 137–147)

## 2014-01-18 LAB — GLUCOSE, CAPILLARY
GLUCOSE-CAPILLARY: 139 mg/dL — AB (ref 70–99)
GLUCOSE-CAPILLARY: 157 mg/dL — AB (ref 70–99)

## 2014-01-18 LAB — CBC WITH DIFFERENTIAL/PLATELET
Basophils Absolute: 0 10*3/uL (ref 0.0–0.1)
Basophils Relative: 1 % (ref 0–1)
Eosinophils Absolute: 0.3 10*3/uL (ref 0.0–0.7)
Eosinophils Relative: 3 % (ref 0–5)
HCT: 37.5 % (ref 36.0–46.0)
Hemoglobin: 13 g/dL (ref 12.0–15.0)
LYMPHS ABS: 1.2 10*3/uL (ref 0.7–4.0)
LYMPHS PCT: 15 % (ref 12–46)
MCH: 32.3 pg (ref 26.0–34.0)
MCHC: 34.7 g/dL (ref 30.0–36.0)
MCV: 93.1 fL (ref 78.0–100.0)
Monocytes Absolute: 0.7 10*3/uL (ref 0.1–1.0)
Monocytes Relative: 10 % (ref 3–12)
NEUTROS ABS: 5.6 10*3/uL (ref 1.7–7.7)
NEUTROS PCT: 71 % (ref 43–77)
PLATELETS: 292 10*3/uL (ref 150–400)
RBC: 4.03 MIL/uL (ref 3.87–5.11)
RDW: 12.9 % (ref 11.5–15.5)
WBC: 7.8 10*3/uL (ref 4.0–10.5)

## 2014-01-18 LAB — PRO B NATRIURETIC PEPTIDE: Pro B Natriuretic peptide (BNP): 917.6 pg/mL — ABNORMAL HIGH (ref 0–450)

## 2014-01-18 LAB — URINALYSIS, ROUTINE W REFLEX MICROSCOPIC
BILIRUBIN URINE: NEGATIVE
Glucose, UA: NEGATIVE mg/dL
HGB URINE DIPSTICK: NEGATIVE
Ketones, ur: NEGATIVE mg/dL
Leukocytes, UA: NEGATIVE
NITRITE: NEGATIVE
Protein, ur: 100 mg/dL — AB
Specific Gravity, Urine: 1.014 (ref 1.005–1.030)
UROBILINOGEN UA: 1 mg/dL (ref 0.0–1.0)
pH: 6.5 (ref 5.0–8.0)

## 2014-01-18 LAB — URINE MICROSCOPIC-ADD ON

## 2014-01-18 LAB — TROPONIN I

## 2014-01-18 MED ORDER — TRAMADOL HCL 50 MG PO TABS: 50.0000 mg | ORAL_TABLET | Freq: Four times a day (QID) | ORAL | Status: DC | PRN

## 2014-01-18 MED ORDER — PANTOPRAZOLE SODIUM 40 MG PO TBEC
40.0000 mg | DELAYED_RELEASE_TABLET | Freq: Every day | ORAL | Status: DC
  Administered 2014-01-18 – 2014-01-21 (×4): 40 mg via ORAL
  Filled 2014-01-18 (×4): qty 1

## 2014-01-18 MED ORDER — POTASSIUM CHLORIDE ER 10 MEQ PO TBCR
10.0000 meq | EXTENDED_RELEASE_TABLET | Freq: Every day | ORAL | Status: DC
  Administered 2014-01-18 – 2014-01-19 (×2): 10 meq via ORAL
  Filled 2014-01-18 (×3): qty 1

## 2014-01-18 MED ORDER — DEXTROSE 5 % IV SOLN
1.0000 g | Freq: Once | INTRAVENOUS | Status: AC
  Administered 2014-01-18: 1 g via INTRAVENOUS
  Filled 2014-01-18: qty 10

## 2014-01-18 MED ORDER — FLUOXETINE HCL 20 MG PO CAPS
20.0000 mg | ORAL_CAPSULE | Freq: Every day | ORAL | Status: DC
  Administered 2014-01-18 – 2014-01-21 (×4): 20 mg via ORAL
  Filled 2014-01-18 (×4): qty 1

## 2014-01-18 MED ORDER — LEVOTHYROXINE SODIUM 50 MCG PO TABS
50.0000 ug | ORAL_TABLET | Freq: Every day | ORAL | Status: DC
  Administered 2014-01-18 – 2014-01-21 (×4): 50 ug via ORAL
  Filled 2014-01-18 (×4): qty 1

## 2014-01-18 MED ORDER — METOPROLOL TARTRATE 50 MG PO TABS
50.0000 mg | ORAL_TABLET | Freq: Every day | ORAL | Status: DC
  Administered 2014-01-19 – 2014-01-21 (×3): 50 mg via ORAL
  Filled 2014-01-18 (×2): qty 1

## 2014-01-18 MED ORDER — ENOXAPARIN SODIUM 40 MG/0.4ML ~~LOC~~ SOLN
40.0000 mg | SUBCUTANEOUS | Status: DC
  Administered 2014-01-18 – 2014-01-20 (×3): 40 mg via SUBCUTANEOUS
  Filled 2014-01-18 (×3): qty 0.4

## 2014-01-18 MED ORDER — DEXTROSE 5 % IV SOLN
500.0000 mg | INTRAVENOUS | Status: DC
  Administered 2014-01-19: 500 mg via INTRAVENOUS
  Filled 2014-01-18: qty 500

## 2014-01-18 MED ORDER — ASPIRIN 81 MG PO CHEW
81.0000 mg | CHEWABLE_TABLET | Freq: Every day | ORAL | Status: DC
  Administered 2014-01-18 – 2014-01-21 (×4): 81 mg via ORAL
  Filled 2014-01-18 (×4): qty 1

## 2014-01-18 MED ORDER — INSULIN ASPART 100 UNIT/ML ~~LOC~~ SOLN
0.0000 [IU] | Freq: Three times a day (TID) | SUBCUTANEOUS | Status: DC
  Administered 2014-01-18 – 2014-01-19 (×2): 1 [IU] via SUBCUTANEOUS
  Administered 2014-01-19: 5 [IU] via SUBCUTANEOUS
  Administered 2014-01-20: 2 [IU] via SUBCUTANEOUS
  Administered 2014-01-20 – 2014-01-21 (×3): 1 [IU] via SUBCUTANEOUS

## 2014-01-18 MED ORDER — ASPIRIN 81 MG PO TABS: 81.0000 mg | ORAL_TABLET | Freq: Every day | ORAL | Status: DC

## 2014-01-18 MED ORDER — AMLODIPINE BESYLATE 5 MG PO TABS
5.0000 mg | ORAL_TABLET | Freq: Every day | ORAL | Status: DC
  Administered 2014-01-19 – 2014-01-21 (×3): 5 mg via ORAL
  Filled 2014-01-18 (×3): qty 1

## 2014-01-18 MED ORDER — DEXTROSE 5 % IV SOLN
500.0000 mg | Freq: Once | INTRAVENOUS | Status: AC
  Administered 2014-01-18: 500 mg via INTRAVENOUS
  Filled 2014-01-18 (×2): qty 500

## 2014-01-18 MED ORDER — CEFTRIAXONE SODIUM IN DEXTROSE 20 MG/ML IV SOLN
1.0000 g | INTRAVENOUS | Status: DC
Start: 2014-01-19 — End: 2014-01-20
  Administered 2014-01-19: 1 g via INTRAVENOUS
  Filled 2014-01-18: qty 50

## 2014-01-18 NOTE — Plan of Care (Signed)
Problem: Phase I Progression Outcomes Goal: Pain controlled with appropriate interventions Outcome: Completed/Met Date Met:  01/18/14 Goal: Hemodynamically stable Outcome: Completed/Met Date Met:  01/18/14

## 2014-01-18 NOTE — ED Notes (Signed)
Per EMS, pt from home (apartment complex).  Pt c/o abdominal pain and headache.  Was walking to bathroom.  Pt then c/o shortness of breath.  HR was 120 without oxygen.Marland Kitchen  HX of a-fib.  90% reg with 4l per [place].  Doesn't normally where oxygen.  Vitals:  120/70, resp 18 with oxygen.  cbg 195.  Denied pain.

## 2014-01-18 NOTE — ED Notes (Signed)
Bed: WA25 Expected date:  Expected time:  Means of arrival:  Comments: EMS SOB 

## 2014-01-18 NOTE — ED Notes (Signed)
Bed: WA06 Expected date:  Expected time:  Means of arrival:  Comments: EMS-SOB 

## 2014-01-18 NOTE — ED Provider Notes (Signed)
CSN: [id number]     Arrival date & time 01/18/14  1045 History   First MD Initiated Contact with Patient 01/18/14 1142     Chief Complaint  Patient presents with  . Shortness of Breath     (Consider location/radiation/quality/duration/timing/severity/associated sxs/prior Treatment) Patient is a 80 y.o. female presenting with shortness of breath.  Shortness of Breath Severity:  Severe Onset quality: Has been gradually worsening for the past few weeks, but suddenly severely worse today. Duration:  30 minutes Timing:  Constant Progression:  Partially resolved Chronicity:  New Context: activity (walking to the bathroom)   Relieved by:  Rest and oxygen Worsened by:  Exertion Associated symptoms: cough (Nonproductive)   Associated symptoms: no chest pain and no fever     Past Medical History  Diagnosis Date  . Hypertension   . Thyroid disease   . Acid reflux disease   . Depression   . Diabetes mellitus   . Urinary tract infection   . Hypothyroidism   . Blood transfusion     hx reaction  . GERD (gastroesophageal reflux disease)     gastrophoresis  . H/O hiatal hernia   . Cancer   . Arthritis   . Pneumonia   . Atrial fibrillation   . Chronic kidney disease   . Dementia   . Hyperlipidemia   . Peripheral neuropathy    Past Surgical History  Procedure Laterality Date  . Joint replacement    . Breast surgery    . Back surgery      1950  . Eye surgery      cataract   History reviewed. No pertinent family history. History  Substance Use Topics  . Smoking status: Never Smoker   . Smokeless tobacco: Never Used  . Alcohol Use: No   OB History    No data available     Review of Systems  Constitutional: Negative for fever.  Respiratory: Positive for cough (Nonproductive) and shortness of breath.   Cardiovascular: Negative for chest pain.  All other systems reviewed and are negative.     Allergies  Influenza vaccines and Pneumococcal vaccines  Home  Medications   Prior to Admission medications   Medication Sig Start Date End Date Taking? Authorizing Provider  amLODipine (NORVASC) 5 MG tablet Take 5 mg by mouth daily.   Yes Historical Provider, MD  aspirin 81 MG tablet Take 81 mg by mouth daily.   Yes Historical Provider, MD  BIOTIN PO Take 1 tablet by mouth daily.   Yes Historical Provider, MD  FLUoxetine (PROZAC) 20 MG capsule Take 20 mg by mouth daily.   Yes Historical Provider, MD  furosemide (LASIX) 10 MG/ML solution Take 10 mg by mouth daily as needed (for swelling).   Yes Historical Provider, MD  levothyroxine (SYNTHROID, LEVOTHROID) 50 MCG tablet Take 50 mcg by mouth daily.   Yes Historical Provider, MD  metoprolol (LOPRESSOR) 50 MG tablet Take 50 mg by mouth daily.   Yes Historical Provider, MD  nitrofurantoin, macrocrystal-monohydrate, (MACROBID) 100 MG capsule Take 100 mg by mouth 2 (two) times daily.   Yes Historical Provider, MD  omeprazole (PRILOSEC) 40 MG capsule Take 40 mg by mouth daily.   Yes Historical Provider, MD  potassium chloride (K-DUR) 10 MEQ tablet Take 10 mEq by mouth daily.   Yes Historical Provider, MD  traMADol (ULTRAM) 50 MG tablet Take 50 mg by mouth every 6 (six) hours as needed for moderate pain.    Yes Historical Provider, MD  ciprofloxacin (  CIPRO) 500 MG tablet Take 1 tablet (500 mg total) by mouth 2 (two) times daily. 09/03/13   Blanchie Dessert, MD   BP 150/78 mmHg  Pulse 74  Temp(Src) 98.4 F (36.9 C) (Oral)  Resp 20  SpO2 96% Physical Exam  Constitutional: She is oriented to person, place, and time. She appears well-developed and well-nourished. No distress.  Elderly  HENT:  Head: Normocephalic and atraumatic.  Mouth/Throat: Oropharynx is clear and moist.  Eyes: Conjunctivae are normal. Pupils are equal, round, and reactive to light. No scleral icterus.  Neck: Neck supple.  Cardiovascular: Normal rate, regular rhythm, normal heart sounds and intact distal pulses.   No murmur  heard. Pulmonary/Chest: Effort normal and breath sounds normal. No stridor. No respiratory distress. She has no wheezes.  Diffuse crackles  Abdominal: Soft. Bowel sounds are normal. She exhibits no distension. There is no tenderness.  Musculoskeletal: Normal range of motion.  Neurological: She is alert and oriented to person, place, and time.  Skin: Skin is warm and dry. No rash noted.  Psychiatric: She has a normal mood and affect. Her behavior is normal.  Nursing note and vitals reviewed.   ED Course  Procedures (including critical care time) Labs Review Labs Reviewed  BASIC METABOLIC PANEL - Abnormal; Notable for the following:    Potassium 3.5 (*)    Glucose, Bld 133 (*)    GFR calc non Af Amer 77 (*)    GFR calc Af Amer 89 (*)    All other components within normal limits  PRO B NATRIURETIC PEPTIDE - Abnormal; Notable for the following:    Pro B Natriuretic peptide (BNP) 917.6 (*)    All other components within normal limits  URINALYSIS, ROUTINE W REFLEX MICROSCOPIC - Abnormal; Notable for the following:    APPearance CLOUDY (*)    Protein, ur 100 (*)    All other components within normal limits  URINE MICROSCOPIC-ADD ON - Abnormal; Notable for the following:    Squamous Epithelial / LPF FEW (*)    Bacteria, UA FEW (*)    All other components within normal limits  CBC WITH DIFFERENTIAL  TROPONIN I    Imaging Review Dg Chest 2 View  01/18/2014   CLINICAL DATA:  New occurrence of shortness of breath. History of bilateral mastectomies.  EXAM: CHEST  2 VIEW  COMPARISON:  09/02/2012 and chest CT 09/10/2012  FINDINGS: Two views of the chest demonstrate chronic lung changes with peripheral reticular densities. There are new airspace densities in the left mid and lower lung region. Heart size is stable. Atherosclerotic calcifications at the aortic arch. Negative for a pneumothorax. Chronic compression deformities in the thoracic spine. No definite pleural effusions.  IMPRESSION:  New airspace densities in the left mid and lower lung region. Findings are concerning for pneumonia. Consider follow-up to ensure resolution.  Chronic lung changes are suggestive for fibrosis.   Electronically Signed   By: Markus Daft M.D.   On: 01/18/2014 12:42  All radiology studies independently viewed by me.      EKG Interpretation None      MDM   Final diagnoses:  Shortness of breath  CAP  80 year old female presenting with some worsening shortness of breath today while walking to the bathroom.  She became severely dyspneic while walking in the emergency department as well. Her workup shows possible left-sided pneumonia. Treated with ceftriaxone and azithromycin. Admitted to internal medicine.    Artis Delay, MD 01/18/14 [phone]

## 2014-01-18 NOTE — ED Notes (Signed)
Pt back from x-ray.

## 2014-01-18 NOTE — Progress Notes (Signed)
  CARE MANAGEMENT ED NOTE 01/18/2014  Patient:  Jodi Price, Jodi Price   Account Number:  [id number]  Date Initiated:  01/18/2014  Documentation initiated by:  Livia Snellen  Subjective/Objective Assessment:   Patient presents to Ed with shortness of breath and tachycardia     Subjective/Objective Assessment Detail:   Patient with pmhx of HTN, DM, hypothyroid, pneumonia, Afib, chronic kidney disease, hyperlipidemia     Action/Plan:   Action/Plan Detail:   Anticipated DC Date:       Status Recommendation to Physician:   Result of Recommendation:    Other ED Services  Consult Working [place]  CM consult  Other  PCP issues    Choice offered to / List presented to:  C-1 Patient          Status of service:  Completed, signed off  ED Comments:   ED Comments Detail:  EDCM spoke to patient at bedside on the unit.  Patient reports her POA is her daughter [name].  Patent reports she lives alone in her apartment at [facility].  Patient confirms her pcp is Dr. Adline Mango at [facility].  Patient reports she has a cane, walker and lift chair at home.  Patient reports she needs assistance with ADL's, cooking and cleaning.  Patient reports she was receiving 80 hours per week of assistance with [employer].  Patient reports a woman had come by her house from [facility] and told the patient, "I didn't need anymore help."  Patient reports her daughter has is going to court to fight this.  Patient reports she has had a lot of falls within the last three years.  Patient reports the last time she fell was this time last year.  Patient reports she has issues with balance.  Patient does nto wear oxygen at home, but patient reports her pcp was supposed to order her oxygen but, "It never showed up."  Patient reports her oxygen dropped when she ambulated around her pcp office. No further EDCM needs at this time.

## 2014-01-18 NOTE — H&P (Signed)
[facility] History and Physical  DUSTYN ARMBRISTER DJS:[id number] DOB: 05-Dec-1919 DOA: 01/18/2014  Referring physician: DR Doy Mince PCP: Adline Mango, MD   Chief Complaint: cough and sob since one week.  HPI: Jodi Price is a 80 y.o. female with prior h/o hypertension, diabetes mellitus, atrial fibrillation, comes in for cough and sob for one week. Her cough is dry and non productive. She denies any chest pain. ona rrival to ed she underwent a CXR revealing pneumonia. Her pro bnp is also elevated slightly. Her labs reveal mild hypokalemia. She denies any other complaints.    Review of Systems:  See hpi for pertinent positives, otherwise rest of the ROS is negative.    Past Medical History  Diagnosis Date  . Hypertension   . Thyroid disease   . Acid reflux disease   . Depression   . Diabetes mellitus   . Urinary tract infection   . Hypothyroidism   . Blood transfusion     hx reaction  . GERD (gastroesophageal reflux disease)     gastrophoresis  . H/O hiatal hernia   . Cancer   . Arthritis   . Pneumonia   . Atrial fibrillation   . Chronic kidney disease   . Dementia   . Hyperlipidemia   . Peripheral neuropathy   . Umbilical hernia    Past Surgical History  Procedure Laterality Date  . Breast surgery    . Back surgery      1950  . Eye surgery      cataract  . Joint replacement      bilateral knees   Social History:  reports that she has never smoked. She has never used smokeless tobacco. She reports that she does not drink alcohol or use illicit drugs.  Allergies  Allergen Reactions  . Influenza Vaccines Anaphylaxis  . Pneumococcal Vaccines Anaphylaxis    Family h/o CAD present.   Prior to Admission medications   Medication Sig Start Date End Date Taking? Authorizing Provider  amLODipine (NORVASC) 5 MG tablet Take 5 mg by mouth daily.   Yes Historical Provider, MD  aspirin 81 MG tablet Take 81 mg by mouth daily.   Yes Historical Provider, MD  BIOTIN  PO Take 1 tablet by mouth daily.   Yes Historical Provider, MD  FLUoxetine (PROZAC) 20 MG capsule Take 20 mg by mouth daily.   Yes Historical Provider, MD  furosemide (LASIX) 10 MG/ML solution Take 10 mg by mouth daily as needed (for swelling).   Yes Historical Provider, MD  levothyroxine (SYNTHROID, LEVOTHROID) 50 MCG tablet Take 50 mcg by mouth daily.   Yes Historical Provider, MD  metoprolol (LOPRESSOR) 50 MG tablet Take 50 mg by mouth daily.   Yes Historical Provider, MD  nitrofurantoin, macrocrystal-monohydrate, (MACROBID) 100 MG capsule Take 100 mg by mouth 2 (two) times daily.   Yes Historical Provider, MD  omeprazole (PRILOSEC) 40 MG capsule Take 40 mg by mouth daily.   Yes Historical Provider, MD  potassium chloride (K-DUR) 10 MEQ tablet Take 10 mEq by mouth daily.   Yes Historical Provider, MD  traMADol (ULTRAM) 50 MG tablet Take 50 mg by mouth every 6 (six) hours as needed for moderate pain.    Yes Historical Provider, MD  ciprofloxacin (CIPRO) 500 MG tablet Take 1 tablet (500 mg total) by mouth 2 (two) times daily. 09/03/13   Blanchie Dessert, MD   Physical Exam: Filed Vitals:   01/18/14 1116 01/18/14 1334  BP: 129/74 150/78  Pulse: 75 74  Temp: 98.4 F (36.9 C)   TempSrc: Oral   Resp: 22 20  SpO2: 100% 96%    Wt Readings from Last 3 Encounters:  09/30/13 58.968 kg (130 lb)  09/03/13 58.514 kg (129 lb)  03/09/12 56.155 kg (123 lb 12.8 oz)    General:  Appears calm and comfortable Eyes: PERRL, normal lids, irises & conjunctiva Neck: no LAD, masses or thyromegaly Cardiovascular: RRR, no m/r/g. No LE edema. Respiratory: CTA bilaterally, no w/r/r. Normal respiratory effort. Abdomen: soft, ntnd Skin: no rash or induration seen on limited exam Musculoskeletal: grossly normal tone BUE/BLE Neurologic: grossly non-focal.          Labs on Admission:  Basic Metabolic Panel:  Recent Labs Lab 01/18/14 1247  NA 138  K 3.5*  CL 100  CO2 25  GLUCOSE 133*  BUN 10    CREATININE 0.59  CALCIUM 9.6   Liver Function Tests: No results for input(s): AST, ALT, ALKPHOS, BILITOT, PROT, ALBUMIN in the last 168 hours. No results for input(s): LIPASE, AMYLASE in the last 168 hours. No results for input(s): AMMONIA in the last 168 hours. CBC:  Recent Labs Lab 01/18/14 1247  WBC 7.8  NEUTROABS 5.6  HGB 13.0  HCT 37.5  MCV 93.1  PLT 292   Cardiac Enzymes:  Recent Labs Lab 01/18/14 1247  TROPONINI <0.30    BNP (last 3 results)  Recent Labs  01/18/14 1247  PROBNP 917.6*   CBG: No results for input(s): GLUCAP in the last 168 hours.  Radiological Exams on Admission: Dg Chest 2 View  01/18/2014   CLINICAL DATA:  New occurrence of shortness of breath. History of bilateral mastectomies.  EXAM: CHEST  2 VIEW  COMPARISON:  09/02/2012 and chest CT 09/10/2012  FINDINGS: Two views of the chest demonstrate chronic lung changes with peripheral reticular densities. There are new airspace densities in the left mid and lower lung region. Heart size is stable. Atherosclerotic calcifications at the aortic arch. Negative for a pneumothorax. Chronic compression deformities in the thoracic spine. No definite pleural effusions.  IMPRESSION: New airspace densities in the left mid and lower lung region. Findings are concerning for pneumonia. Consider follow-up to ensure resolution.  Chronic lung changes are suggestive for fibrosis.   Electronically Signed   By: Markus Daft M.D.   On: 01/18/2014 12:42    EKG: pending.  Assessment/Plan Active Problems:   CAP (community acquired pneumonia)   Community acquired pneumonia Admitted to telemetry. Started on IV rocephin and zitthrmax. No fever or chills. Blood cultures ordered and pending. Oxygen to keep sats90%    Elevated probnp: Order echocardiogram for further evaluation.  Hypertension controlled.  Diet controlled DM  DVT prophylaxis.   Code Status: DMR DVT Prophylaxis: Family Communication: Discussed with  family at bedside.  Disposition Plan: admit to tele  Time spent: 55 min  [facility] Pager [phone]

## 2014-01-19 ENCOUNTER — Inpatient Hospital Stay (HOSPITAL_COMMUNITY): Payer: Medicare Other

## 2014-01-19 DIAGNOSIS — E440 Moderate protein-calorie malnutrition: Secondary | ICD-10-CM | POA: Diagnosis present

## 2014-01-19 DIAGNOSIS — I10 Essential (primary) hypertension: Secondary | ICD-10-CM

## 2014-01-19 DIAGNOSIS — I482 Chronic atrial fibrillation, unspecified: Secondary | ICD-10-CM | POA: Diagnosis present

## 2014-01-19 DIAGNOSIS — E039 Hypothyroidism, unspecified: Secondary | ICD-10-CM | POA: Diagnosis present

## 2014-01-19 DIAGNOSIS — I369 Nonrheumatic tricuspid valve disorder, unspecified: Secondary | ICD-10-CM

## 2014-01-19 LAB — GLUCOSE, CAPILLARY
Glucose-Capillary: 114 mg/dL — ABNORMAL HIGH (ref 70–99)
Glucose-Capillary: 147 mg/dL — ABNORMAL HIGH (ref 70–99)
Glucose-Capillary: 285 mg/dL — ABNORMAL HIGH (ref 70–99)
Glucose-Capillary: 106 mg/dL — ABNORMAL HIGH (ref 70–99)

## 2014-01-19 LAB — CBC WITH DIFFERENTIAL/PLATELET
Basophils Absolute: 0 10*3/uL (ref 0.0–0.1)
Basophils Relative: 1 % (ref 0–1)
Eosinophils Absolute: 0.3 10*3/uL (ref 0.0–0.7)
Eosinophils Relative: 5 % (ref 0–5)
HCT: 34.1 % — ABNORMAL LOW (ref 36.0–46.0)
Hemoglobin: 11.7 g/dL — ABNORMAL LOW (ref 12.0–15.0)
Lymphocytes Relative: 25 % (ref 12–46)
Lymphs Abs: 1.5 10*3/uL (ref 0.7–4.0)
MCH: 32.2 pg (ref 26.0–34.0)
MCHC: 34.3 g/dL (ref 30.0–36.0)
MCV: 93.9 fL (ref 78.0–100.0)
Monocytes Absolute: 0.7 10*3/uL (ref 0.1–1.0)
Monocytes Relative: 12 % (ref 3–12)
Neutro Abs: 3.5 10*3/uL (ref 1.7–7.7)
Neutrophils Relative %: 57 % (ref 43–77)
Platelets: 259 10*3/uL (ref 150–400)
RBC: 3.63 MIL/uL — ABNORMAL LOW (ref 3.87–5.11)
RDW: 13 % (ref 11.5–15.5)
WBC: 6 10*3/uL (ref 4.0–10.5)

## 2014-01-19 LAB — TSH: TSH: 4.1 u[IU]/mL (ref 0.350–4.500)

## 2014-01-19 LAB — BASIC METABOLIC PANEL
Anion gap: 13 (ref 5–15)
BUN: 10 mg/dL (ref 6–23)
CALCIUM: 9.1 mg/dL (ref 8.4–10.5)
CO2: 23 meq/L (ref 19–32)
Chloride: 98 mEq/L (ref 96–112)
Creatinine, Ser: 0.59 mg/dL (ref 0.50–1.10)
GFR calc Af Amer: 89 mL/min — ABNORMAL LOW (ref >90)
GFR calc non Af Amer: 77 mL/min — ABNORMAL LOW (ref >90)
GLUCOSE: 130 mg/dL — AB (ref 70–99)
Potassium: 3.4 mEq/L — ABNORMAL LOW (ref 3.7–5.3)
Sodium: 134 mEq/L — ABNORMAL LOW (ref 137–147)

## 2014-01-19 LAB — LEGIONELLA ANTIGEN, URINE

## 2014-01-19 LAB — STREP PNEUMONIAE URINARY ANTIGEN: STREP PNEUMO URINARY ANTIGEN: NEGATIVE

## 2014-01-19 LAB — PRO B NATRIURETIC PEPTIDE: PRO B NATRI PEPTIDE: 889.5 pg/mL — AB (ref 0–450)

## 2014-01-19 LAB — HIV ANTIBODY (ROUTINE TESTING W REFLEX): HIV 1&2 Ab, 4th Generation: NONREACTIVE

## 2014-01-19 MED ORDER — DM-GUAIFENESIN ER 30-600 MG PO TB12
1.0000 | ORAL_TABLET | Freq: Two times a day (BID) | ORAL | Status: DC
  Administered 2014-01-19 – 2014-01-21 (×5): 1 via ORAL
  Filled 2014-01-19 (×5): qty 1

## 2014-01-19 MED ORDER — GLUCERNA SHAKE PO LIQD
237.0000 mL | ORAL | Status: DC
  Administered 2014-01-19 – 2014-01-20 (×2): 237 mL via ORAL
  Filled 2014-01-19 (×3): qty 237

## 2014-01-19 MED ORDER — IPRATROPIUM-ALBUTEROL 0.5-2.5 (3) MG/3ML IN SOLN
3.0000 mL | Freq: Four times a day (QID) | RESPIRATORY_TRACT | Status: DC
  Administered 2014-01-19 – 2014-01-20 (×4): 3 mL via RESPIRATORY_TRACT
  Filled 2014-01-19 (×4): qty 3

## 2014-01-19 MED ORDER — POLYETHYLENE GLYCOL 3350 17 G PO PACK
17.0000 g | PACK | Freq: Two times a day (BID) | ORAL | Status: DC
  Administered 2014-01-19 – 2014-01-21 (×5): 17 g via ORAL
  Filled 2014-01-19 (×5): qty 1

## 2014-01-19 NOTE — Plan of Care (Signed)
Problem: Phase I Progression Outcomes Goal: OOB as tolerated unless otherwise ordered Outcome: Completed/Met Date Met:  01/19/14

## 2014-01-19 NOTE — Progress Notes (Signed)
INITIAL NUTRITION ASSESSMENT  DOCUMENTATION CODES Per approved criteria  -Non-severe (moderate) malnutrition in the context of chronic illness  Pt meets criteria for moderate MALNUTRITION in the context of chronic illness as evidenced by 5% body weight loss in one month, moderate muscle wasting and subcutaneous fat loss in clavicle and acromion regions.   INTERVENTION: -Recommend Glucerna shake once daily to provide 220 kcal, 10 gram protein -Encouraged intake of small high kcal/protein snacks to assist with muscle maintenance -Reviewed DM2 supplement alternatives for pt to trial upon d/c -Contacted Case Management for assistance with d/c needs -RD to continue to monitor  NUTRITION DIAGNOSIS: Unintentional weight loss related to inadequate energy intake  as evidenced by 7-8 lb weight loss in one month.   Goal: Pt to meet >/= 90% of their estimated nutrition needs    Monitor:  Total protein/energy intake, labs, weights, supplement tolerance  Reason for Assessment: MST  80 y.o. female  Admitting Dx: <principal problem not specified>  ASSESSMENT: Jodi Price is a 80 y.o. female with prior h/o hypertension, diabetes mellitus, atrial fibrillation, comes in for cough and sob for one week. Her cough is dry and non productive.  -Pt's daughter reported pt consuming several small meals daily; will snack on yogurt, crackers, sandwiches, and drinks diabetic nutrition supplements such as Glucerna -Current PO intake is >75%, is eating well and enjoying full meals of sandwiches and soups -Endorsed weight loss, pt's daughter noted pt's usual weight 128 lbs one month ago (5% body weight loss, significant for time frame). Daughter also expressed concern for evident moderate muscle wasting around clavicle and acromion regions -Reviewed nutrition supplements; pt willing to consume Glucerna once daily as HS snack. Encouraged continued consumption with Glucerna or other alternatives such as Boost  GlucoseControl or SugarFree [employer] -Pt's daughter expressed concern with pt's d/c needs; will require assistance with oxygen needs/bathing/cooking and other daily activities. Contacted Case Manager to help guide pt and family  Height: Ht Readings from Last 1 Encounters:  01/18/14 4\' 11"  (1.499 m)    Weight: Wt Readings from Last 1 Encounters:  01/18/14 121 lb 4.1 oz (55 kg)    Ideal Body Weight: 98.5 lb  % Ideal Body Weight: 122%  Wt Readings from Last 10 Encounters:  01/18/14 121 lb 4.1 oz (55 kg)  09/30/13 130 lb (58.968 kg)  09/03/13 129 lb (58.514 kg)  03/09/12 123 lb 12.8 oz (56.155 kg)  07/28/11 121 lb (54.885 kg)    Usual Body Weight: 128 lb  % Usual Body Weight: 95%  BMI:  Body mass index is 24.48 kg/(m^2).  Estimated Nutritional Needs: Kcal: 1350-1550 Protein: 55-65 gram Fluid: >/= 1400 ml daily  Skin: WDL  Diet Order: Diet Carb Modified  EDUCATION NEEDS: -Education needs addressed   Intake/Output Summary (Last 24 hours) at 01/19/14 1344 Last data filed at 01/19/14 0418  Gross per 24 hour  Intake    360 ml  Output    850 ml  Net   -490 ml    Last BM: 11/17   Labs:   Recent Labs Lab 01/18/14 1247 01/19/14 0850  NA 138 134*  K 3.5* 3.4*  CL 100 98  CO2 25 23  BUN 10 10  CREATININE 0.59 0.59  CALCIUM 9.6 9.1  GLUCOSE 133* 130*    CBG (last 3)   Recent Labs  01/18/14 2136 01/19/14 0733 01/19/14 1151  GLUCAP 157* 114* 147*    Scheduled Meds: . amLODipine  5 mg Oral Daily  .  aspirin  81 mg Oral Daily  . azithromycin (ZITHROMAX) 500 MG IVPB  500 mg Intravenous Q24H  . cefTRIAXone (ROCEPHIN)  IV  1 g Intravenous Q24H  . dextromethorphan-guaiFENesin  1 tablet Oral BID  . enoxaparin (LOVENOX) injection  40 mg Subcutaneous Q24H  . FLUoxetine  20 mg Oral Daily  . insulin aspart  0-9 Units Subcutaneous TID WC  . ipratropium-albuterol  3 mL Nebulization Q6H  . levothyroxine  50 mcg Oral QAC breakfast  .  metoprolol  50 mg Oral Daily  . pantoprazole  40 mg Oral Daily  . potassium chloride  10 mEq Oral Daily    Continuous Infusions:   Past Medical History  Diagnosis Date  . Hypertension   . Thyroid disease   . Acid reflux disease   . Depression   . Diabetes mellitus   . Urinary tract infection   . Hypothyroidism   . Blood transfusion     hx reaction  . GERD (gastroesophageal reflux disease)     gastrophoresis  . H/O hiatal hernia   . Cancer   . Arthritis   . Pneumonia   . Atrial fibrillation   . Chronic kidney disease   . Dementia   . Hyperlipidemia   . Peripheral neuropathy   . Umbilical hernia     Past Surgical History  Procedure Laterality Date  . Breast surgery    . Back surgery      1950  . Eye surgery      cataract  . Joint replacement      bilateral knees    Atlee Abide MS RD LDN Clinical Dietitian [phone]

## 2014-01-19 NOTE — Care Management Note (Addendum)
    Page 1 of 2   01/21/2014     8:11:53 PM CARE MANAGEMENT NOTE 01/21/2014  Patient:  Jodi Price, Jodi Price   Account Number:  [id number]  Date Initiated:  01/19/2014  Documentation initiated by:  [facility]  Subjective/Objective Assessment:   80 Y/O F ADMITTED W/PNA.     Action/Plan:   FROM HOME.DTR ALREADY HAS PCS-[facility] YOU[facility].HAS DME.   Anticipated DC Date:  01/21/2014   Anticipated DC Plan:  [place]  CM consult      Choice offered to / List presented to:  C-4 Adult Children   DME arranged  [facility]      DME agency  [place] arranged  HH-1 RN  [place]      [place].   Status of service:  Completed, signed off Medicare Important Message given?  YES (If response is "NO", the following Medicare IM given date fields will be blank) Date Medicare IM given:  01/21/2014 Medicare IM given by:  [facility] Date Additional Medicare IM given:   Additional Medicare IM given by:    Discharge Disposition:  [place]  Per UR Regulation:  Reviewed for med. necessity/level of care/duration of stay  If discussed at [place] of Stay Meetings, dates discussed:    Comments:  01/21/14 Blayke Pinera RN,BSN NCM [phone] D/C HOME [place] [facility].  01/20/14 Brynnly Bonet RN,BSN NCM [place] [facility] REP [place].[facility] DME REP AWARE OF QUALIFYING 02 SATS, & HOME 02 ORDER.AWAIT D/C.  01/19/14 Beauford Lando RN,BSN NCM [phone] AWAIT RECOMMENDATIONS.NOTED 02 SATS.WILL CONTINUE TO MONITOR.

## 2014-01-19 NOTE — Progress Notes (Signed)
Echo Lab  2D Echocardiogram completed.  Jodi Price Jodi Price, RDCS 01/19/2014 1:20 PM

## 2014-01-19 NOTE — Progress Notes (Signed)
Patient's oxygen saturation was between 82-88% on room air while ambulating in the room.  Patient was placed back on nasal cannula when patient went back to bed.

## 2014-01-19 NOTE — Plan of Care (Signed)
Problem: Phase I Progression Outcomes Goal: Voiding-avoid urinary catheter unless indicated Outcome: Completed/Met Date Met:  01/19/14     

## 2014-01-19 NOTE — Progress Notes (Signed)
[facility] PROGRESS NOTE  Jodi Price IRC:[id number] DOB: Mar 16, 1919 DOA: 01/18/2014 PCP: Adline Mango, MD  Assessment/Plan: #1 community acquired pneumonia Clinical improvement. Patient is currently afebrile. WBCs normalized. Urine strep pneumococcus antigen is negative. Blood cultures pending. Continue empiric IV Rocephin and IV azithromycin. Will add Mucinex with nebulizer treatments. Follow.  #2 hypertension Stable. Continue Norvasc and metoprolol.  #3 hypothyroidism Continue current dose Synthroid.  #4 gastroesophageal reflux disease PPI.  #5 malnuitrition Continue nutritional supplements.  #6 depression Stable. Continue home regimen of Prozac.  #7 chronic atrial fibrillation Currently rate controlled on Lopressor. Aspirin.  #8 diabetes mellitus CBGs ranging from 114 -285.continue sliding scale insulin.  #9 prophylaxis PPI for GI prophylaxis. Lovenox for DVT prophylaxis.   Code Status: DO NOT RESUSCITATE Family Communication: updated patient and daughter at bedside. Disposition Plan: home with home health services in 1-2 days.   Consultants:  none  Procedures:  Chest x-ray 01/18/2014, 01/19/2014  2-D echo pending  Antibiotics:  IV Rocephin 01/18/2014  IV azithromycin 01/18/2014  HPI/Subjective: Patient states feeling much better than on admission. No complaints.  Objective: Filed Vitals:   01/19/14 1345  BP: 117/60  Pulse: 82  Temp: 98.4 F (36.9 C)  Resp: 18    Intake/Output Summary (Last 24 hours) at 01/19/14 1730 Last data filed at 01/19/14 1300  Gross per 24 hour  Intake    840 ml  Output   1100 ml  Net   -260 ml   Filed Weights   01/18/14 1644  Weight: 55 kg (121 lb 4.1 oz)    Exam:   General:  NAD  Cardiovascular: RRR  Respiratory: FINE DIFFUSE CRACKLES.  Abdomen: soft, nontender, nondistended, positive bowel sounds.  Musculoskeletal: no clubbing cyanosis or edema.  Data Reviewed: Basic Metabolic  Panel:  Recent Labs Lab 01/18/14 1247 01/19/14 0850  NA 138 134*  K 3.5* 3.4*  CL 100 98  CO2 25 23  GLUCOSE 133* 130*  BUN 10 10  CREATININE 0.59 0.59  CALCIUM 9.6 9.1   Liver Function Tests: No results for input(s): AST, ALT, ALKPHOS, BILITOT, PROT, ALBUMIN in the last 168 hours. No results for input(s): LIPASE, AMYLASE in the last 168 hours. No results for input(s): AMMONIA in the last 168 hours. CBC:  Recent Labs Lab 01/18/14 1247 01/19/14 0850  WBC 7.8 6.0  NEUTROABS 5.6 3.5  HGB 13.0 11.7*  HCT 37.5 34.1*  MCV 93.1 93.9  PLT 292 259   Cardiac Enzymes:  Recent Labs Lab 01/18/14 1247  TROPONINI <0.30   BNP (last 3 results)  Recent Labs  01/18/14 1247 01/19/14 0850  PROBNP 917.6* 889.5*   CBG:  Recent Labs Lab 01/18/14 1716 01/18/14 2136 01/19/14 0733 01/19/14 1151 01/19/14 1706  GLUCAP 139* 157* 114* 147* 285*    Recent Results (from the past 240 hour(s))  Blood culture (routine x 2)     Status: None (Preliminary result)   Collection Time: 01/18/14  2:58 PM  Result Value Ref Range Status   Specimen Description BLOOD BLOOD RIGHT FOREARM  Final   Special Requests BOTTLES DRAWN AEROBIC AND ANAEROBIC 4 ML  Final   Culture  Setup Time   Final    01/18/2014 18:35 Performed at [facility]    Culture   Final           BLOOD CULTURE RECEIVED NO GROWTH TO DATE CULTURE WILL BE HELD FOR 5 DAYS BEFORE ISSUING A FINAL NEGATIVE REPORT Performed at [facility]    Report  Status PENDING  Incomplete  Blood culture (routine x 2)     Status: None (Preliminary result)   Collection Time: 01/18/14  2:59 PM  Result Value Ref Range Status   Specimen Description BLOOD RIGHT WRIST  Final   Special Requests BOTTLES DRAWN AEROBIC AND ANAEROBIC 3 ML  Final   Culture  Setup Time   Final    01/18/2014 18:34 Performed at [facility]    Culture   Final           BLOOD CULTURE RECEIVED NO GROWTH TO DATE CULTURE WILL BE HELD FOR 5 DAYS  BEFORE ISSUING A FINAL NEGATIVE REPORT Performed at [facility]    Report Status PENDING  Incomplete     Studies: Dg Chest 2 View  01/19/2014   CLINICAL DATA:  Short of breath. Weakness. Patient admitted yesterday for shortness of breath and abdominal pain.  EXAM: CHEST  2 VIEW  COMPARISON:  01/18/2014  FINDINGS: Cardiac silhouette is normal in size. No mediastinal or hilar masses or convincing adenopathy.  There is hazy airspace opacity in the left mid and lower lung. There are bilateral coarse reticular opacities, primarily peripherally, and heterogeneous distribution, similar to prior exams. Small focus of airspace opacity is noted in the right mid lung peripherally.  No pleural effusion.  No pneumothorax.  Bony thorax is demineralized. There are compression deformities of the thoracic spine, stable.  There are changes from previous right breast surgery, stable.  IMPRESSION: 1. Area of airspace opacity in the left mid to lower lung is stable from the previous day's exam. This may reflect pneumonia. The findings could be due to progression of interstitial fibrosis. 2. Areas of coarse reticular opacity in the lungs are most suggestive of interstitial fibrosis. 3. No evidence of pulmonary edema.   Electronically Signed   By: Lajean Manes M.D.   On: 01/19/2014 10:13   Dg Chest 2 View  01/18/2014   CLINICAL DATA:  New occurrence of shortness of breath. History of bilateral mastectomies.  EXAM: CHEST  2 VIEW  COMPARISON:  09/02/2012 and chest CT 09/10/2012  FINDINGS: Two views of the chest demonstrate chronic lung changes with peripheral reticular densities. There are new airspace densities in the left mid and lower lung region. Heart size is stable. Atherosclerotic calcifications at the aortic arch. Negative for a pneumothorax. Chronic compression deformities in the thoracic spine. No definite pleural effusions.  IMPRESSION: New airspace densities in the left mid and lower lung region. Findings  are concerning for pneumonia. Consider follow-up to ensure resolution.  Chronic lung changes are suggestive for fibrosis.   Electronically Signed   By: Markus Daft M.D.   On: 01/18/2014 12:42    Scheduled Meds: . amLODipine  5 mg Oral Daily  . aspirin  81 mg Oral Daily  . azithromycin (ZITHROMAX) 500 MG IVPB  500 mg Intravenous Q24H  . cefTRIAXone (ROCEPHIN)  IV  1 g Intravenous Q24H  . dextromethorphan-guaiFENesin  1 tablet Oral BID  . enoxaparin (LOVENOX) injection  40 mg Subcutaneous Q24H  . feeding supplement (GLUCERNA SHAKE)  237 mL Oral Q24H  . FLUoxetine  20 mg Oral Daily  . insulin aspart  0-9 Units Subcutaneous TID WC  . ipratropium-albuterol  3 mL Nebulization Q6H  . levothyroxine  50 mcg Oral QAC breakfast  . metoprolol  50 mg Oral Daily  . pantoprazole  40 mg Oral Daily  . polyethylene glycol  17 g Oral BID  . potassium chloride  10  mEq Oral Daily   Continuous Infusions:   Principal Problem:   CAP (community acquired pneumonia) Active Problems:   DM (diabetes mellitus)   HTN (hypertension)   Malnutrition of moderate degree   Hypothyroidism   Chronic a-fib    Time spent: [place] MD [facility] Pager [phone]. If 7PM-7AM, please contact night-coverage at [email or web address], password [facility] 01/19/2014, 5:30 PM  LOS: 1 day

## 2014-01-19 NOTE — Plan of Care (Signed)
Problem: Phase II Progression Outcomes Goal: Vital signs remain stable Outcome: Completed/Met Date Met:  01/19/14

## 2014-01-19 NOTE — Plan of Care (Signed)
Problem: Phase III Progression Outcomes Goal: Foley discontinued Outcome: Not Applicable Date Met:  01/19/14     

## 2014-01-20 ENCOUNTER — Inpatient Hospital Stay (HOSPITAL_COMMUNITY): Payer: Medicare Other

## 2014-01-20 DIAGNOSIS — R110 Nausea: Secondary | ICD-10-CM | POA: Insufficient documentation

## 2014-01-20 LAB — CBC
HEMATOCRIT: 33.9 % — AB (ref 36.0–46.0)
Hemoglobin: 11.1 g/dL — ABNORMAL LOW (ref 12.0–15.0)
MCH: 31.6 pg (ref 26.0–34.0)
MCHC: 32.7 g/dL (ref 30.0–36.0)
MCV: 96.6 fL (ref 78.0–100.0)
Platelets: 281 10*3/uL (ref 150–400)
RBC: 3.51 MIL/uL — ABNORMAL LOW (ref 3.87–5.11)
RDW: 13.3 % (ref 11.5–15.5)
WBC: 6.7 10*3/uL (ref 4.0–10.5)

## 2014-01-20 LAB — BASIC METABOLIC PANEL
Anion gap: 10 (ref 5–15)
BUN: 14 mg/dL (ref 6–23)
CO2: 27 mEq/L (ref 19–32)
Calcium: 9.3 mg/dL (ref 8.4–10.5)
Chloride: 102 mEq/L (ref 96–112)
Creatinine, Ser: 0.8 mg/dL (ref 0.50–1.10)
GFR calc Af Amer: 72 mL/min — ABNORMAL LOW (ref >90)
GFR calc non Af Amer: 62 mL/min — ABNORMAL LOW (ref >90)
Glucose, Bld: 148 mg/dL — ABNORMAL HIGH (ref 70–99)
Potassium: 5.2 mEq/L (ref 3.7–5.3)
Sodium: 139 mEq/L (ref 137–147)

## 2014-01-20 LAB — GLUCOSE, CAPILLARY
GLUCOSE-CAPILLARY: 177 mg/dL — AB (ref 70–99)
Glucose-Capillary: 124 mg/dL — ABNORMAL HIGH (ref 70–99)
Glucose-Capillary: 114 mg/dL — ABNORMAL HIGH (ref 70–99)

## 2014-01-20 MED ORDER — IPRATROPIUM-ALBUTEROL 0.5-2.5 (3) MG/3ML IN SOLN
3.0000 mL | Freq: Three times a day (TID) | RESPIRATORY_TRACT | Status: DC
  Administered 2014-01-20 – 2014-01-21 (×3): 3 mL via RESPIRATORY_TRACT
  Filled 2014-01-20 (×3): qty 3

## 2014-01-20 MED ORDER — ONDANSETRON HCL 4 MG/2ML IJ SOLN: 4.0000 mg | Freq: Four times a day (QID) | INTRAMUSCULAR | Status: DC | PRN

## 2014-01-20 MED ORDER — ONDANSETRON HCL 4 MG/2ML IJ SOLN
4.0000 mg | INTRAMUSCULAR | Status: AC
  Administered 2014-01-20: 4 mg via INTRAVENOUS
  Filled 2014-01-20: qty 2

## 2014-01-20 MED ORDER — ALBUTEROL SULFATE (2.5 MG/3ML) 0.083% IN NEBU: 2.5000 mg | INHALATION_SOLUTION | RESPIRATORY_TRACT | Status: DC | PRN

## 2014-01-20 MED ORDER — LEVOFLOXACIN 750 MG PO TABS
750.0000 mg | ORAL_TABLET | ORAL | Status: DC
  Administered 2014-01-20: 750 mg via ORAL
  Filled 2014-01-20: qty 1

## 2014-01-20 MED ORDER — SORBITOL 70 % SOLN
30.0000 mL | Freq: Every day | Status: DC | PRN
  Administered 2014-01-20: 30 mL via ORAL
  Filled 2014-01-20 (×4): qty 30

## 2014-01-20 MED ORDER — AMOXICILLIN-POT CLAVULANATE 875-125 MG PO TABS
1.0000 | ORAL_TABLET | Freq: Two times a day (BID) | ORAL | Status: DC
  Administered 2014-01-20 – 2014-01-21 (×2): 1 via ORAL
  Filled 2014-01-20 (×2): qty 1

## 2014-01-20 MED ORDER — ACETAMINOPHEN 325 MG PO TABS
650.0000 mg | ORAL_TABLET | ORAL | Status: DC | PRN
  Administered 2014-01-20 (×2): 650 mg via ORAL
  Filled 2014-01-20 (×2): qty 2

## 2014-01-20 NOTE — Plan of Care (Signed)
Problem: Phase I Progression Outcomes Goal: Initial discharge plan identified Outcome: Completed/Met Date Met:  01/20/14  Problem: Phase II Progression Outcomes Goal: Progress activity as tolerated unless otherwise ordered Outcome: Completed/Met Date Met:  01/20/14

## 2014-01-20 NOTE — Evaluation (Signed)
Occupational Therapy Evaluation Patient Details Name: Jodi Price MRN: [id number] DOB: 1919-05-02 Today's Date: 01/20/2014    History of Present Illness 80 yo female admitted with Pna. hx of HTN, Afib. [facility].    Clinical Impression   Pt doing well and has good caregiver support at home. No DME needs and covered some energy conservation techniques for completing tasks at home. Will follow while pt is in hospital to reinforce energy conservation strategies.    Follow Up Recommendations  No OT follow up;Supervision - Intermittent    Equipment Recommendations  None recommended by OT    Recommendations for Other Services       Precautions / Restrictions Precautions Precautions: Fall Precaution Comments: monitor sats/O2 Restrictions Weight Bearing Restrictions: No      Mobility Bed Mobility Overal bed mobility: Modified Independent                Transfers Overall transfer level: Modified independent               General transfer comment: modified independent from bed and toilet with UE use.    Balance                                            ADL Overall ADL's : Needs assistance/impaired Eating/Feeding: Independent;Sitting   Grooming: Wash/dry hands;Supervision/safety;Standing   Upper Body Bathing: Set up;Sitting   Lower Body Bathing: Supervison/ safety;Sit to/from stand   Upper Body Dressing : Set up;Sitting   Lower Body Dressing: Supervision/safety;Sit to/from stand   Toilet Transfer: Supervision/safety;Ambulation;RW;Comfort height toilet;Grab bars   Toileting- Clothing Manipulation and Hygiene: Supervision/safety;Sit to/from stand         General ADL Comments: Pt states she has a walk in shower with very minimal ledge to step over. Grab bars in shower and at toilet. Caregiver is with her when she showers/dresses. They also assist with meals, laundry. Discussed energy conservation techniques  as she does get SOB with activity. Had pt practice PLB techniques when she came back out of bathroom and sat on EOB. Pt able to recover with short rest. Discussed other energy conservation techniques including breaking up tasks, taking plenty of rest breaks, pacing self.      Vision                     Perception     Praxis      Pertinent Vitals/Pain Pain Assessment: 0-10 Pain Score: 8  Pain Location: head Pain Descriptors / Indicators: Aching Pain Intervention(s): Patient requesting pain meds-RN notified     Hand Dominance     Extremity/Trunk Assessment Upper Extremity Assessment Upper Extremity Assessment: Overall WFL for tasks assessed      Cervical / Trunk Assessment Cervical / Trunk Assessment: Kyphotic   Communication Communication Communication: No difficulties   Cognition Arousal/Alertness: Awake/alert Behavior During Therapy: WFL for tasks assessed/performed Overall Cognitive Status: Within Functional Limits for tasks assessed                     General Comments       Exercises       Shoulder Instructions      Home Living Family/patient expects to be discharged to:: Private residence Living Arrangements: Alone Available Help at Discharge: Personal care attendant (aide helps get into shower; household chores) Type of Home: Independent living  facility Home Access: Level entry     Home Layout: One level     Bathroom Shower/Tub: Occupational psychologist: Handicapped height     Home Equipment: Walker - 4 wheels;Grab bars - toilet;Grab bars - tub/shower;Shower seat          Prior Functioning/Environment Level of Independence: Needs assistance    ADL's / Homemaking Assistance Needed: caregiver assists with meals, laundry and showering/dressing as needed.        OT Diagnosis: Generalized weakness   OT Problem List: Decreased strength;Decreased knowledge of use of DME or AE   OT Treatment/Interventions:  Self-care/ADL training;Patient/family education;Therapeutic activities;DME and/or AE instruction;Energy conservation    OT Goals(Current goals can be found in the care plan section) Acute Rehab OT Goals Patient Stated Goal: home soon OT Goal Formulation: With patient Time For Goal Achievement: 02/03/14 Potential to Achieve Goals: Good  OT Frequency: Min 2X/week   Barriers to D/C:            Co-evaluation              End of Session Equipment Utilized During Treatment: Rolling walker;Oxygen  Activity Tolerance: Patient tolerated treatment well Patient left: in bed;with call bell/phone within reach;with bed alarm set   Time: 2449-7530 OT Time Calculation (min): 26 min Charges:  OT General Charges $OT Visit: 1 Procedure OT Evaluation $Initial OT Evaluation Tier I: 1 Procedure OT Treatments $Self Care/Home Management : 8-22 mins $Therapeutic Activity: 8-22 mins G-Codes:    Jules Schick  [phone] 01/20/2014, 10:30 AM

## 2014-01-20 NOTE — Progress Notes (Signed)
Soap sud enema given. Pt tolerated well.  Only a small amount of stool post enema.

## 2014-01-20 NOTE — Plan of Care (Signed)
Problem: Phase I Progression Outcomes Goal: Initial discharge plan identified Outcome: Progressing Goal: Other Phase I Outcomes/Goals Outcome: Completed/Met Date Met:  01/20/14  Problem: Phase II Progression Outcomes Goal: Progress activity as tolerated unless otherwise ordered Outcome: Progressing  Problem: Phase III Progression Outcomes Goal: Voiding independently Outcome: Completed/Met Date Met:  01/20/14

## 2014-01-20 NOTE — Progress Notes (Signed)
[facility] PROGRESS NOTE  Jodi Price XTK:[id number] DOB: [birth year] DOA: 01/18/2014 PCP: Adline Mango, MD  Assessment/Plan: #1 community acquired pneumonia Clinical improvement. Patient is currently afebrile. WBCs normalized. Urine strep pneumococcus antigen is negative. Blood cultures pending. Changed IV Rocephin and azithromycin to oral Levaquin however patient with complaints of nausea and a such will discontinue oral Levaquin and start patient on oral Augmentin. Continue Mucinex with nebulizer treatments. Follow.  #2 hypertension Stable. Continue Norvasc and metoprolol.  #3 hypothyroidism Continue current dose Synthroid.  #4 gastroesophageal reflux disease PPI.  #5 malnuitrition Continue nutritional supplements.  #6 depression Stable. Continue home regimen of Prozac.  #7 chronic atrial fibrillation Currently rate controlled on Lopressor. Aspirin.  #8 diabetes mellitus CBGs ranging from 106 -285.continue sliding scale insulin.  #9 nausea Questionable etiology. Will discontinue oral Levaquin and placed on oral Augmentin instead. Will check an abdominal x-ray. Antiemetics.  #10 prophylaxis PPI for GI prophylaxis. Lovenox for DVT prophylaxis.   Code Status: DO NOT RESUSCITATE Family Communication: updated patient and daughter at bedside. Disposition Plan: home with home health services in 1-2 days.   Consultants:  none  Procedures:  Chest x-ray 01/18/2014, 01/19/2014  2-D echo 01/19/2014  Antibiotics:  IV Rocephin 01/18/2014>>> 01/20/2014  IV azithromycin 01/18/2014>>> 01/20/2014  Oral Levaquin 01/20/2014>>>> 01/20/2014  Oral Augmentin 01/20/2014  HPI/Subjective: Patient was alert this morning with no complaints. This afternoon however patient with complaints of nausea and just not feeling well.  Objective: Filed Vitals:   01/20/14 1333  BP: 103/54  Pulse: 63  Temp: 98 F (36.7 C)  Resp: 20    Intake/Output Summary (Last 24 hours) at  01/20/14 1921 Last data filed at 01/20/14 1700  Gross per 24 hour  Intake   1120 ml  Output   1100 ml  Net     20 ml   Filed Weights   01/18/14 1644  Weight: 55 kg (121 lb 4.1 oz)    Exam:   General:  NAD  Cardiovascular: RRR  Respiratory: FINE DIFFUSE CRACKLES.  Abdomen: soft, nontender, nondistended, positive bowel sounds.  Musculoskeletal: no clubbing cyanosis or edema.  Data Reviewed: Basic Metabolic Panel:  Recent Labs Lab 01/18/14 1247 01/19/14 0850 01/20/14 0454  NA 138 134* 139  K 3.5* 3.4* 5.2  CL 100 98 102  CO2 25 23 27   GLUCOSE 133* 130* 148*  BUN 10 10 14   CREATININE 0.59 0.59 0.80  CALCIUM 9.6 9.1 9.3   Liver Function Tests: No results for input(s): AST, ALT, ALKPHOS, BILITOT, PROT, ALBUMIN in the last 168 hours. No results for input(s): LIPASE, AMYLASE in the last 168 hours. No results for input(s): AMMONIA in the last 168 hours. CBC:  Recent Labs Lab 01/18/14 1247 01/19/14 0850 01/20/14 0454  WBC 7.8 6.0 6.7  NEUTROABS 5.6 3.5  --   HGB 13.0 11.7* 11.1*  HCT 37.5 34.1* 33.9*  MCV 93.1 93.9 96.6  PLT 292 259 281   Cardiac Enzymes:  Recent Labs Lab 01/18/14 1247  TROPONINI <0.30   BNP (last 3 results)  Recent Labs  01/18/14 1247 01/19/14 0850  PROBNP 917.6* 889.5*   CBG:  Recent Labs Lab 01/19/14 1706 01/19/14 2145 01/20/14 0744 01/20/14 1148 01/20/14 1713  GLUCAP 285* 106* 124* 114* 177*    Recent Results (from the past 240 hour(s))  Blood culture (routine x 2)     Status: None (Preliminary result)   Collection Time: 01/18/14  2:58 PM  Result Value Ref Range Status   Specimen Description BLOOD  BLOOD RIGHT FOREARM  Final   Special Requests BOTTLES DRAWN AEROBIC AND ANAEROBIC 4 ML  Final   Culture  Setup Time   Final    01/18/2014 18:35 Performed at [facility]    Culture   Final           BLOOD CULTURE RECEIVED NO GROWTH TO DATE CULTURE WILL BE HELD FOR 5 DAYS BEFORE ISSUING A FINAL NEGATIVE  REPORT Performed at [facility]    Report Status PENDING  Incomplete  Blood culture (routine x 2)     Status: None (Preliminary result)   Collection Time: 01/18/14  2:59 PM  Result Value Ref Range Status   Specimen Description BLOOD RIGHT WRIST  Final   Special Requests BOTTLES DRAWN AEROBIC AND ANAEROBIC 3 ML  Final   Culture  Setup Time   Final    01/18/2014 18:34 Performed at [facility]    Culture   Final           BLOOD CULTURE RECEIVED NO GROWTH TO DATE CULTURE WILL BE HELD FOR 5 DAYS BEFORE ISSUING A FINAL NEGATIVE REPORT Performed at [facility]    Report Status PENDING  Incomplete     Studies: Dg Chest 2 View  01/19/2014   CLINICAL DATA:  Short of breath. Weakness. Patient admitted yesterday for shortness of breath and abdominal pain.  EXAM: CHEST  2 VIEW  COMPARISON:  01/18/2014  FINDINGS: Cardiac silhouette is normal in size. No mediastinal or hilar masses or convincing adenopathy.  There is hazy airspace opacity in the left mid and lower lung. There are bilateral coarse reticular opacities, primarily peripherally, and heterogeneous distribution, similar to prior exams. Small focus of airspace opacity is noted in the right mid lung peripherally.  No pleural effusion.  No pneumothorax.  Bony thorax is demineralized. There are compression deformities of the thoracic spine, stable.  There are changes from previous right breast surgery, stable.  IMPRESSION: 1. Area of airspace opacity in the left mid to lower lung is stable from the previous day's exam. This may reflect pneumonia. The findings could be due to progression of interstitial fibrosis. 2. Areas of coarse reticular opacity in the lungs are most suggestive of interstitial fibrosis. 3. No evidence of pulmonary edema.   Electronically Signed   By: Lajean Manes M.D.   On: 01/19/2014 10:13   Dg Abd 1 View  01/20/2014   CLINICAL DATA:  Abdominal pain  EXAM: ABDOMEN - 1 VIEW  COMPARISON:  08/26/2012   FINDINGS: There is a moderate stool burden identified throughout the colon. Mild gaseous distension of the stomach is noted. There is no dilated small bowel loops. Calcified fibroid is identified within the right side of pelvis.  IMPRESSION: Moderate stool burden within the colon which may be indicative of constipation.   Electronically Signed   By: Kerby Moors M.D.   On: 01/20/2014 17:10    Scheduled Meds: . amLODipine  5 mg Oral Daily  . amoxicillin-clavulanate  1 tablet Oral Q12H  . aspirin  81 mg Oral Daily  . dextromethorphan-guaiFENesin  1 tablet Oral BID  . enoxaparin (LOVENOX) injection  40 mg Subcutaneous Q24H  . feeding supplement (GLUCERNA SHAKE)  237 mL Oral Q24H  . FLUoxetine  20 mg Oral Daily  . insulin aspart  0-9 Units Subcutaneous TID WC  . ipratropium-albuterol  3 mL Nebulization TID  . levothyroxine  50 mcg Oral QAC breakfast  . metoprolol  50 mg Oral Daily  .  pantoprazole  40 mg Oral Daily  . polyethylene glycol  17 g Oral BID   Continuous Infusions:   Principal Problem:   CAP (community acquired pneumonia) Active Problems:   DM (diabetes mellitus)   HTN (hypertension)   Malnutrition of moderate degree   Hypothyroidism   Chronic a-fib    Time spent: [place] MD [facility] Pager [phone]. If 7PM-7AM, please contact night-coverage at [email or web address], password [facility] 01/20/2014, 7:21 PM  LOS: 2 days

## 2014-01-20 NOTE — Progress Notes (Signed)
SATURATION QUALIFICATIONS: (This note is used to comply with regulatory documentation for home oxygen)  Patient Saturations on Room Air at Rest = 93%  Patient Saturations on Room Air while Ambulating = 87%  Patient Saturations on 2 Liters of oxygen while Ambulating = 95%  Please briefly explain why patient needs home oxygen:  Oxygen levels desaturate while on room air

## 2014-01-20 NOTE — Evaluation (Signed)
Physical Therapy Evaluation Patient Details Name: Jodi Price MRN: [id number] DOB: Jan 30, 1920 Today's Date: 01/20/2014   History of Present Illness  80 yo female admitted with Pna. hx of HTN, Afib. [facility]   Clinical Impression  On eval, pt was supervision level for mobility-able to walk ~115 feet with use of walker. Supervision to safely manage with O2 tank. Feel pt may benefit from small O2 tank/carrier with shoulder bag. Large tank will likely be too cumbersome for pt to manage safely with use of walker. Recommend HHPT as well, at least for home safety evaluation.     Follow Up Recommendations Home health PT    Equipment Recommendations   (small O2 carrier tank/shoulder bag)    Recommendations for Other Services OT consult     Precautions / Restrictions Precautions Precautions: Fall Precaution Comments: monitor sats/O2 Restrictions Weight Bearing Restrictions: No      Mobility  Bed Mobility Overal bed mobility: Modified Independent                Transfers Overall transfer level: Modified independent                  Ambulation/Gait Ambulation/Gait assistance: Supervision Ambulation Distance (Feet): 115 Feet Assistive device: Rolling walker (2 wheeled) Gait Pattern/deviations: Step-through pattern;Decreased stride length;Trunk flexed     General Gait Details: supervision for equipment (O2 tank/line). slow but steady with use of walker.   Stairs            Wheelchair Mobility    Modified Rankin (Stroke Patients Only)       Balance                                             Pertinent Vitals/Pain Pain Assessment: No/denies pain    Home Living Family/patient expects to be discharged to:: Private residence Living Arrangements: Alone Available Help at Discharge: Personal care attendant (aide helps get into shower; household chores) Type of Home: Independent living facility Home Access:  Level entry     Home Layout: One level Home Equipment: Environmental consultant - 4 wheels      Prior Function                 Hand Dominance        Extremity/Trunk Assessment   Upper Extremity Assessment: Defer to OT evaluation           Lower Extremity Assessment: Generalized weakness      Cervical / Trunk Assessment: Kyphotic  Communication   Communication: No difficulties  Cognition Arousal/Alertness: Awake/alert Behavior During Therapy: WFL for tasks assessed/performed Overall Cognitive Status: Within Functional Limits for tasks assessed                      General Comments      Exercises        Assessment/Plan    PT Assessment Patient needs continued PT services  PT Diagnosis Generalized weakness   PT Problem List Decreased strength;Decreased activity tolerance;Decreased balance;Decreased mobility;Pain;Decreased knowledge of use of DME  PT Treatment Interventions DME instruction;Gait training;Functional mobility training;Therapeutic activities;Therapeutic exercise;Balance training;Patient/family education   PT Goals (Current goals can be found in the Care Plan section) Acute Rehab PT Goals Patient Stated Goal: home soon PT Goal Formulation: With patient Time For Goal Achievement: 02/03/14 Potential to Achieve Goals: Good  Frequency Min 3X/week   Barriers to discharge        Co-evaluation               End of Session Equipment Utilized During Treatment: Gait belt;Oxygen Activity Tolerance: Patient tolerated treatment well Patient left: in bed;with call bell/phone within reach;with bed alarm set           Time: 0927-0951 PT Time Calculation (min) (ACUTE ONLY): 24 min   Charges:   PT Evaluation $Initial PT Evaluation Tier I: 1 Procedure PT Treatments $Gait Training: 8-22 mins $Therapeutic Activity: 8-22 mins   PT G Codes:          Weston Anna, MPT Pager: [phone]

## 2014-01-21 DIAGNOSIS — K5900 Constipation, unspecified: Secondary | ICD-10-CM | POA: Insufficient documentation

## 2014-01-21 LAB — CBC
HCT: 32.9 % — ABNORMAL LOW (ref 36.0–46.0)
Hemoglobin: 10.7 g/dL — ABNORMAL LOW (ref 12.0–15.0)
MCH: 31.5 pg (ref 26.0–34.0)
MCHC: 32.5 g/dL (ref 30.0–36.0)
MCV: 96.8 fL (ref 78.0–100.0)
Platelets: 261 10*3/uL (ref 150–400)
RBC: 3.4 MIL/uL — ABNORMAL LOW (ref 3.87–5.11)
RDW: 13 % (ref 11.5–15.5)
WBC: 6.2 10*3/uL (ref 4.0–10.5)

## 2014-01-21 LAB — BASIC METABOLIC PANEL
ANION GAP: 13 (ref 5–15)
BUN: 12 mg/dL (ref 6–23)
CO2: 25 meq/L (ref 19–32)
CREATININE: 0.6 mg/dL (ref 0.50–1.10)
Calcium: 9.2 mg/dL (ref 8.4–10.5)
Chloride: 98 mEq/L (ref 96–112)
GFR calc Af Amer: 89 mL/min — ABNORMAL LOW (ref >90)
GFR calc non Af Amer: 77 mL/min — ABNORMAL LOW (ref >90)
Glucose, Bld: 150 mg/dL — ABNORMAL HIGH (ref 70–99)
Potassium: 3.8 mEq/L (ref 3.7–5.3)
Sodium: 136 mEq/L — ABNORMAL LOW (ref 137–147)

## 2014-01-21 LAB — GLUCOSE, CAPILLARY
GLUCOSE-CAPILLARY: 145 mg/dL — AB (ref 70–99)
Glucose-Capillary: 75 mg/dL (ref 70–99)
Glucose-Capillary: 128 mg/dL — ABNORMAL HIGH (ref 70–99)

## 2014-01-21 MED ORDER — AMOXICILLIN-POT CLAVULANATE 875-125 MG PO TABS: 1.0000 | ORAL_TABLET | Freq: Two times a day (BID) | ORAL | Status: DC

## 2014-01-21 MED ORDER — POLYETHYLENE GLYCOL 3350 17 G PO PACK: 17.0000 g | PACK | Freq: Two times a day (BID) | ORAL | Status: AC

## 2014-01-21 MED ORDER — ALBUTEROL SULFATE HFA 108 (90 BASE) MCG/ACT IN AERS: 2.0000 | INHALATION_SPRAY | Freq: Four times a day (QID) | RESPIRATORY_TRACT | Status: AC | PRN

## 2014-01-21 MED ORDER — NITROFURANTOIN MONOHYD MACRO 100 MG PO CAPS: 100.0000 mg | ORAL_CAPSULE | Freq: Two times a day (BID) | ORAL | Status: DC

## 2014-01-21 MED ORDER — DM-GUAIFENESIN ER 30-600 MG PO TB12: 1.0000 | ORAL_TABLET | Freq: Two times a day (BID) | ORAL | Status: DC

## 2014-01-21 MED ORDER — SENNOSIDES-DOCUSATE SODIUM 8.6-50 MG PO TABS: 1.0000 | ORAL_TABLET | Freq: Every day | ORAL | Status: DC

## 2014-01-21 MED ORDER — AZITHROMYCIN 250 MG PO TABS: ORAL_TABLET | ORAL | Status: DC

## 2014-01-21 MED ORDER — GLUCERNA SHAKE PO LIQD: 237.0000 mL | ORAL | Status: DC

## 2014-01-21 NOTE — Progress Notes (Signed)
[facility]  [facility] is providing the following services: Home Oxygen  If patient discharges after hours, please call [phone].   Linward Headland 01/21/2014, 3:38 PM

## 2014-01-21 NOTE — Plan of Care (Signed)
Problem: Phase III Progression Outcomes Goal: Pain controlled on oral analgesia Outcome: Not Applicable Date Met:  23/53/61

## 2014-01-21 NOTE — Progress Notes (Signed)
Occupational Therapy Treatment Patient Details Name: Jodi Price MRN: [id number] DOB: 01-11-1920 Today's Date: 01/21/2014    History of present illness 80 yo female admitted with Pna. hx of HTN, Afib. [facility]    OT comments  Pt progressing nicely with OT.  Currently, she requires supervision with BADLs.  DOE 2/4.  Follow Up Recommendations  No OT follow up;Supervision - Intermittent    Equipment Recommendations  None recommended by OT    Recommendations for Other Services      Precautions / Restrictions Precautions Precautions: Fall Precaution Comments: monitor sats/O2       Mobility Bed Mobility Overal bed mobility: Modified Independent                Transfers Overall transfer level: Modified independent                    Balance                                   ADL       Grooming: Wash/dry hands;Oral care;Standing;Supervision/safety                   Toilet Transfer: Supervision/safety;Ambulation;Comfort height toilet;Grab bars   Toileting- Clothing Manipulation and Hygiene: Supervision/safety;Sit to/from stand         General ADL Comments: Pt very pleasant.  DOE 2/4 with ADL activities.  Requires min verbal cues for walker safety when negotiating small spaces       Vision                     Perception     Praxis      Cognition   Behavior During Therapy: [facility] for tasks assessed/performed Overall Cognitive Status: Within Functional Limits for tasks assessed                       Extremity/Trunk Assessment               Exercises     Shoulder Instructions       General Comments      Pertinent Vitals/ Pain       Pain Assessment: No/denies pain  Home Living                                          Prior Functioning/Environment              Frequency Min 2X/week     Progress Toward Goals  OT Goals(current goals can  now be found in the care plan section)  Progress towards OT goals: Progressing toward goals  ADL Goals Pt Will Perform Grooming: with modified independence;standing Pt Will Transfer to Toilet: with modified independence;ambulating;regular height toilet;grab bars Pt Will Perform Toileting - Clothing Manipulation and hygiene: with modified independence;sit to/from stand Additional ADL Goal #1: Pt will verbalize 3 energy conservation strateiges for ADL independently.  Plan Discharge plan remains appropriate    Co-evaluation                 End of Session Equipment Utilized During Treatment: Rolling walker;Oxygen   Activity Tolerance Patient tolerated treatment well   Patient Left in chair;with call bell/phone within reach;with chair alarm set   Nurse Communication Mobility status  Time: 6283-1517 OT Time Calculation (min): 26 min  Charges: OT General Charges $OT Visit: 1 Procedure OT Treatments $Self Care/Home Management : 23-37 mins  Jodi Price M 01/21/2014, 3:55 PM

## 2014-01-21 NOTE — Plan of Care (Signed)
Problem: Discharge Progression Outcomes Goal: Pain controlled with appropriate interventions Outcome: Not Applicable Date Met:  14/23/95

## 2014-01-21 NOTE — Discharge Summary (Signed)
Physician Discharge Summary  [name] XBL:[id number] DOB: [birth year] DOA: 01/18/2014  PCP: Adline Mango, MD  Admit date: 01/18/2014 Discharge date: 01/21/2014  Time spent: 65 minutes  Recommendations for Outpatient Follow-up:  1. Follow-up with Adline Mango, MD in 1 week or at all follow-up basic metabolic profile need to be obtained to follow-up on a Leksell lites and renal function. Patient's common acquired pneumonia on the to be reassessed at that time.   Discharge Diagnoses:  Principal Problem:   CAP (community acquired pneumonia) Active Problems:   DM (diabetes mellitus)   HTN (hypertension)   Malnutrition of moderate degree   Hypothyroidism   Chronic a-fib   Nausea   Discharge Condition: Stable and improved  Diet recommendation: Regular  Filed Weights   01/18/14 1644  Weight: 55 kg (121 lb 4.1 oz)    History of present illness:  Jodi Price is a 80 y.o. female with prior h/o hypertension, diabetes mellitus, atrial fibrillation, who presented to the ED with cough and sob for one week. Her cough was dry and non productive. She denied any chest pain. On arrival to ed she underwent a CXR revealing pneumonia. Her pro bnp is also elevated slightly. Her labs reveal mild hypokalemia. She denied any other complaints.    Hospital Course:  #1 community acquired pneumonia Patient had presented with cough and shortness of breath for 1 week prior to admission. Chest x-ray which was done was concerning for pneumonia. Patient was also noted to have a slightly elevated proBNP. Patient was admitted and subsequently started on empiric IV Rocephin and IV azithromycin. Patient improved clinically. Patient's WBC normalized. Patient remained afebrile. Urine strep pneumococcus antigen was negative. Blood cultures pending with no growth to date. Patient was initially transitioned from IV Rocephin and IV azithromycin to oral Levaquin however after starting oral Levaquin patient did have some  complaints of nausea and a such oral Levaquin was changed to oral Augmentin. Patient be discharged home on 5 more days of oral Augmentin and azithromycin to complete a one-week course of antibiotic therapy. Patient be discharged home in stable and improved condition.  #2 hypertension Stable. Continued on home regimen of Norvasc and metoprolol.  #3 hypothyroidism Continued on home regimen of Synthroid.  #4 gastroesophageal reflux disease PPI.  #5 malnuitrition Continued on nutritional supplements.  #6 depression Stable. Continued on home regimen of Prozac.  #7 chronic atrial fibrillation Patient was maintained on Lopressor for rate control and aspirin. Patient's atrial fibrillation remained stable throughout the hospitalization.   #8 diabetes mellitus Patient was maintained on a sliding scale insulin throughout the hospitalization.  #9 nausea/constipation During the hospitalization patient developed nausea and just didn't feel well. Patient's IV antibiotics had recently been changed to Levaquin and due to patient's nausea Levaquin was discontinued in patients done oral Augmentin. Abdominal x-rays were done which were consistent with constipation. Patient was maintained on a bowel regimen and given enemas with good results and good bowel movement. Patient improved clinically and will be discharged home in stable and improved condition.    Procedures:  Chest x-ray 01/18/2014, 01/19/2014  2-D echo 01/19/2014  Consultations:  None  Discharge Exam: Filed Vitals:   01/21/14 1422  BP: 132/64  Pulse: 64  Temp: 98.2 F (36.8 C)  Resp: 18    General: NAD Cardiovascular: RRR Respiratory: CTAB  Discharge Instructions You were cared for by a hospitalist during your hospital stay. If you have any questions about your discharge medications or the care you received while you were  in the hospital after you are discharged, you can call the unit and asked to speak with the hospitalist  on call if the hospitalist that took care of you is not available. Once you are discharged, your primary care physician will handle any further medical issues. Please note that NO REFILLS for any discharge medications will be authorized once you are discharged, as it is imperative that you return to your primary care physician (or establish a relationship with a primary care physician if you do not have one) for your aftercare needs so that they can reassess your need for medications and monitor your lab values.  Discharge Instructions    Diet general    Complete by:  As directed      Discharge instructions    Complete by:  As directed   Follow up with Adline Mango, MD in 1 week.     Increase activity slowly    Complete by:  As directed           Current Discharge Medication List    START taking these medications   Details  albuterol (PROVENTIL HFA;VENTOLIN HFA) 108 (90 BASE) MCG/ACT inhaler Inhale 2 puffs into the lungs every 6 (six) hours as needed for wheezing or shortness of breath. Use 2 puffs 3 times daily x 5 days then 2 puffs every 6 hours as needed. Qty: 1 Inhaler, Refills: 0    amoxicillin-clavulanate (AUGMENTIN) 875-125 MG per tablet Take 1 tablet by mouth every 12 (twelve) hours. Take for 5 days then stop. Qty: 10 tablet, Refills: 0    azithromycin (ZITHROMAX) 250 MG tablet Take 1 tablet daily x 5 days then stop. Qty: 5 each, Refills: 0    dextromethorphan-guaiFENesin (MUCINEX DM) 30-600 MG per 12 hr tablet Take 1 tablet by mouth 2 (two) times daily. Take for 5 days then stop. Qty: 10 tablet, Refills: 0    feeding supplement, GLUCERNA SHAKE, (GLUCERNA SHAKE) LIQD Take 237 mLs by mouth daily. Refills: 0    polyethylene glycol (MIRALAX / GLYCOLAX) packet Take 17 g by mouth 2 (two) times daily. Qty: 14 each, Refills: 0    senna-docusate (SENOKOT S) 8.6-50 MG per tablet Take 1 tablet by mouth at bedtime.      CONTINUE these medications which have CHANGED   Details   nitrofurantoin, macrocrystal-monohydrate, (MACROBID) 100 MG capsule Take 1 capsule (100 mg total) by mouth 2 (two) times daily. Resume on 01/27/14.      CONTINUE these medications which have NOT CHANGED   Details  amLODipine (NORVASC) 5 MG tablet Take 5 mg by mouth daily.    aspirin 81 MG tablet Take 81 mg by mouth daily.    BIOTIN PO Take 1 tablet by mouth daily.    FLUoxetine (PROZAC) 20 MG capsule Take 20 mg by mouth daily.    furosemide (LASIX) 10 MG/ML solution Take 10 mg by mouth daily as needed (for swelling).    levothyroxine (SYNTHROID, LEVOTHROID) 50 MCG tablet Take 50 mcg by mouth daily.    metoprolol (LOPRESSOR) 50 MG tablet Take 50 mg by mouth daily.    omeprazole (PRILOSEC) 40 MG capsule Take 40 mg by mouth daily.    potassium chloride (K-DUR) 10 MEQ tablet Take 10 mEq by mouth daily.    traMADol (ULTRAM) 50 MG tablet Take 50 mg by mouth every 6 (six) hours as needed for moderate pain.       STOP taking these medications     ciprofloxacin (CIPRO) 500 MG tablet  Allergies  Allergen Reactions  . Influenza Vaccines Anaphylaxis  . Pneumococcal Vaccines Anaphylaxis   Follow-up Information    Follow up with [facility].   Why:  HHRN/PT/OT/AIDE/SW.DME-OXYGEN   Contact information:   [place] [address] [phone]       Follow up with Adline Mango, MD. Schedule an appointment as soon as possible for a visit in 1 week.   Specialty:  Internal Medicine       The results of significant diagnostics from this hospitalization (including imaging, microbiology, ancillary and laboratory) are listed below for reference.    Significant Diagnostic Studies: Dg Chest 2 View  01/19/2014   CLINICAL DATA:  Short of breath. Weakness. Patient admitted yesterday for shortness of breath and abdominal pain.  EXAM: CHEST  2 VIEW  COMPARISON:  01/18/2014  FINDINGS: Cardiac silhouette is normal in size. No mediastinal or hilar  masses or convincing adenopathy.  There is hazy airspace opacity in the left mid and lower lung. There are bilateral coarse reticular opacities, primarily peripherally, and heterogeneous distribution, similar to prior exams. Small focus of airspace opacity is noted in the right mid lung peripherally.  No pleural effusion.  No pneumothorax.  Bony thorax is demineralized. There are compression deformities of the thoracic spine, stable.  There are changes from previous right breast surgery, stable.  IMPRESSION: 1. Area of airspace opacity in the left mid to lower lung is stable from the previous day's exam. This may reflect pneumonia. The findings could be due to progression of interstitial fibrosis. 2. Areas of coarse reticular opacity in the lungs are most suggestive of interstitial fibrosis. 3. No evidence of pulmonary edema.   Electronically Signed   By: Lajean Manes M.D.   On: 01/19/2014 10:13   Dg Chest 2 View  01/18/2014   CLINICAL DATA:  New occurrence of shortness of breath. History of bilateral mastectomies.  EXAM: CHEST  2 VIEW  COMPARISON:  09/02/2012 and chest CT 09/10/2012  FINDINGS: Two views of the chest demonstrate chronic lung changes with peripheral reticular densities. There are new airspace densities in the left mid and lower lung region. Heart size is stable. Atherosclerotic calcifications at the aortic arch. Negative for a pneumothorax. Chronic compression deformities in the thoracic spine. No definite pleural effusions.  IMPRESSION: New airspace densities in the left mid and lower lung region. Findings are concerning for pneumonia. Consider follow-up to ensure resolution.  Chronic lung changes are suggestive for fibrosis.   Electronically Signed   By: Markus Daft M.D.   On: 01/18/2014 12:42   Dg Abd 1 View  01/20/2014   CLINICAL DATA:  Abdominal pain  EXAM: ABDOMEN - 1 VIEW  COMPARISON:  08/26/2012  FINDINGS: There is a moderate stool burden identified throughout the colon. Mild gaseous  distension of the stomach is noted. There is no dilated small bowel loops. Calcified fibroid is identified within the right side of pelvis.  IMPRESSION: Moderate stool burden within the colon which may be indicative of constipation.   Electronically Signed   By: Kerby Moors M.D.   On: 01/20/2014 17:10    Microbiology: Recent Results (from the past 240 hour(s))  Blood culture (routine x 2)     Status: None (Preliminary result)   Collection Time: 01/18/14  2:58 PM  Result Value Ref Range Status   Specimen Description BLOOD BLOOD RIGHT FOREARM  Final   Special Requests BOTTLES DRAWN AEROBIC AND ANAEROBIC 4 ML  Final   Culture  Setup Time   Final    01/18/2014 18:35 Performed at [facility]    Culture   Final           BLOOD CULTURE RECEIVED NO GROWTH TO DATE CULTURE WILL BE HELD FOR 5 DAYS BEFORE ISSUING A FINAL NEGATIVE REPORT Performed at [facility]    Report Status PENDING  Incomplete  Blood culture (routine x 2)     Status: None (Preliminary result)   Collection Time: 01/18/14  2:59 PM  Result Value Ref Range Status   Specimen Description BLOOD RIGHT WRIST  Final   Special Requests BOTTLES DRAWN AEROBIC AND ANAEROBIC 3 ML  Final   Culture  Setup Time   Final    01/18/2014 18:34 Performed at [facility]    Culture   Final           BLOOD CULTURE RECEIVED NO GROWTH TO DATE CULTURE WILL BE HELD FOR 5 DAYS BEFORE ISSUING A FINAL NEGATIVE REPORT Performed at [facility]    Report Status PENDING  Incomplete     Labs: Basic Metabolic Panel:  Recent Labs Lab 01/18/14 1247 01/19/14 0850 01/20/14 0454 01/21/14 0530  NA 138 134* 139 136*  K 3.5* 3.4* 5.2 3.8  CL 100 98 102 98  CO2 25 23 27 25   GLUCOSE 133* 130* 148* 150*  BUN 10 10 14 12   CREATININE 0.59 0.59 0.80 0.60  CALCIUM 9.6 9.1 9.3 9.2   Liver Function Tests: No results for input(s): AST, ALT, ALKPHOS, BILITOT, PROT, ALBUMIN in the last 168 hours. No results for input(s):  LIPASE, AMYLASE in the last 168 hours. No results for input(s): AMMONIA in the last 168 hours. CBC:  Recent Labs Lab 01/18/14 1247 01/19/14 0850 01/20/14 0454 01/21/14 0530  WBC 7.8 6.0 6.7 6.2  NEUTROABS 5.6 3.5  --   --   HGB 13.0 11.7* 11.1* 10.7*  HCT 37.5 34.1* 33.9* 32.9*  MCV 93.1 93.9 96.6 96.8  PLT 292 259 281 261   Cardiac Enzymes:  Recent Labs Lab 01/18/14 1247  TROPONINI <0.30   BNP: BNP (last 3 results)  Recent Labs  01/18/14 1247 01/19/14 0850  PROBNP 917.6* 889.5*   CBG:  Recent Labs Lab 01/20/14 1148 01/20/14 1713 01/20/14 2048 01/21/14 0758 01/21/14 1208  GLUCAP 114* 177* 75 128* 145*       Signed:  Doug Bucklin MD [facility] 01/21/2014, 3:02 PM

## 2014-01-21 NOTE — Plan of Care (Signed)
Problem: Discharge Progression Outcomes Goal: Tolerating diet Outcome: Completed/Met Date Met:  01/21/14     

## 2014-01-21 NOTE — Plan of Care (Signed)
Problem: Phase II Progression Outcomes Goal: Obtain order to discontinue catheter if appropriate Outcome: Not Applicable Date Met:  01/21/14     

## 2014-01-22 ENCOUNTER — Inpatient Hospital Stay (HOSPITAL_COMMUNITY)
Admission: EM | Admit: 2014-01-22 | Discharge: 2014-01-31 | Disposition: A | Discharge status: Skilled Nursing Facility | Payer: Medicare Other | Source: Home / Self Care | Attending: Internal Medicine | Admitting: Internal Medicine

## 2014-01-22 ENCOUNTER — Encounter (HOSPITAL_COMMUNITY): Payer: Self-pay

## 2014-01-22 ENCOUNTER — Emergency Department (HOSPITAL_COMMUNITY): Payer: Medicare Other

## 2014-01-22 DIAGNOSIS — R1313 Dysphagia, pharyngeal phase: Secondary | ICD-10-CM

## 2014-01-22 DIAGNOSIS — E785 Hyperlipidemia, unspecified: Secondary | ICD-10-CM

## 2014-01-22 DIAGNOSIS — I129 Hypertensive chronic kidney disease with stage 1 through stage 4 chronic kidney disease, or unspecified chronic kidney disease: Secondary | ICD-10-CM

## 2014-01-22 DIAGNOSIS — R05 Cough: Secondary | ICD-10-CM

## 2014-01-22 DIAGNOSIS — I482 Chronic atrial fibrillation, unspecified: Secondary | ICD-10-CM | POA: Diagnosis present

## 2014-01-22 DIAGNOSIS — F0390 Unspecified dementia without behavioral disturbance: Secondary | ICD-10-CM | POA: Diagnosis present

## 2014-01-22 DIAGNOSIS — J189 Pneumonia, unspecified organism: Secondary | ICD-10-CM | POA: Diagnosis present

## 2014-01-22 DIAGNOSIS — R0600 Dyspnea, unspecified: Secondary | ICD-10-CM

## 2014-01-22 DIAGNOSIS — R0602 Shortness of breath: Secondary | ICD-10-CM | POA: Insufficient documentation

## 2014-01-22 DIAGNOSIS — K219 Gastro-esophageal reflux disease without esophagitis: Secondary | ICD-10-CM

## 2014-01-22 DIAGNOSIS — F329 Major depressive disorder, single episode, unspecified: Secondary | ICD-10-CM

## 2014-01-22 DIAGNOSIS — Z887 Allergy status to serum and vaccine status: Secondary | ICD-10-CM

## 2014-01-22 DIAGNOSIS — T380X5A Adverse effect of glucocorticoids and synthetic analogues, initial encounter: Secondary | ICD-10-CM | POA: Diagnosis present

## 2014-01-22 DIAGNOSIS — Z7982 Long term (current) use of aspirin: Secondary | ICD-10-CM

## 2014-01-22 DIAGNOSIS — J849 Interstitial pulmonary disease, unspecified: Secondary | ICD-10-CM

## 2014-01-22 DIAGNOSIS — R509 Fever, unspecified: Secondary | ICD-10-CM

## 2014-01-22 DIAGNOSIS — E1165 Type 2 diabetes mellitus with hyperglycemia: Secondary | ICD-10-CM

## 2014-01-22 DIAGNOSIS — G629 Polyneuropathy, unspecified: Secondary | ICD-10-CM | POA: Diagnosis present

## 2014-01-22 DIAGNOSIS — J9620 Acute and chronic respiratory failure, unspecified whether with hypoxia or hypercapnia: Secondary | ICD-10-CM

## 2014-01-22 DIAGNOSIS — M1990 Unspecified osteoarthritis, unspecified site: Secondary | ICD-10-CM | POA: Diagnosis present

## 2014-01-22 DIAGNOSIS — J84112 Idiopathic pulmonary fibrosis: Secondary | ICD-10-CM

## 2014-01-22 DIAGNOSIS — Z96653 Presence of artificial knee joint, bilateral: Secondary | ICD-10-CM | POA: Diagnosis present

## 2014-01-22 DIAGNOSIS — N189 Chronic kidney disease, unspecified: Secondary | ICD-10-CM | POA: Diagnosis present

## 2014-01-22 DIAGNOSIS — E119 Type 2 diabetes mellitus without complications: Secondary | ICD-10-CM

## 2014-01-22 DIAGNOSIS — R059 Cough, unspecified: Secondary | ICD-10-CM

## 2014-01-22 DIAGNOSIS — I10 Essential (primary) hypertension: Secondary | ICD-10-CM

## 2014-01-22 DIAGNOSIS — Y95 Nosocomial condition: Secondary | ICD-10-CM | POA: Diagnosis present

## 2014-01-22 DIAGNOSIS — K5900 Constipation, unspecified: Secondary | ICD-10-CM | POA: Diagnosis present

## 2014-01-22 DIAGNOSIS — K449 Diaphragmatic hernia without obstruction or gangrene: Secondary | ICD-10-CM

## 2014-01-22 DIAGNOSIS — Z66 Do not resuscitate: Secondary | ICD-10-CM

## 2014-01-22 DIAGNOSIS — E039 Hypothyroidism, unspecified: Secondary | ICD-10-CM | POA: Diagnosis present

## 2014-01-22 LAB — BASIC METABOLIC PANEL
Anion gap: 15 (ref 5–15)
BUN: 11 mg/dL (ref 6–23)
CO2: 25 mEq/L (ref 19–32)
CREATININE: 0.58 mg/dL (ref 0.50–1.10)
Calcium: 9.4 mg/dL (ref 8.4–10.5)
Chloride: 97 mEq/L (ref 96–112)
GFR calc non Af Amer: 78 mL/min — ABNORMAL LOW (ref >90)
GFR, EST AFRICAN AMERICAN: 90 mL/min — AB (ref >90)
Glucose, Bld: 215 mg/dL — ABNORMAL HIGH (ref 70–99)
POTASSIUM: 4 meq/L (ref 3.7–5.3)
Sodium: 137 mEq/L (ref 137–147)

## 2014-01-22 LAB — CBC
HEMATOCRIT: 36.8 % (ref 36.0–46.0)
Hemoglobin: 12.1 g/dL (ref 12.0–15.0)
MCH: 31.5 pg (ref 26.0–34.0)
MCHC: 32.9 g/dL (ref 30.0–36.0)
MCV: 95.8 fL (ref 78.0–100.0)
Platelets: 287 10*3/uL (ref 150–400)
RBC: 3.84 MIL/uL — ABNORMAL LOW (ref 3.87–5.11)
RDW: 13.1 % (ref 11.5–15.5)
WBC: 11.7 10*3/uL — ABNORMAL HIGH (ref 4.0–10.5)

## 2014-01-22 LAB — I-STAT TROPONIN, ED: Troponin i, poc: 0.01 ng/mL (ref 0.00–0.08)

## 2014-01-22 LAB — PRO B NATRIURETIC PEPTIDE: Pro B Natriuretic peptide (BNP): 1026 pg/mL — ABNORMAL HIGH (ref 0–450)

## 2014-01-22 MED ORDER — AMLODIPINE BESYLATE 5 MG PO TABS
5.0000 mg | ORAL_TABLET | Freq: Every day | ORAL | Status: DC
  Administered 2014-01-23 – 2014-01-31 (×9): 5 mg via ORAL
  Filled 2014-01-22 (×10): qty 1

## 2014-01-22 MED ORDER — ACETAMINOPHEN 650 MG RE SUPP: 650.0000 mg | Freq: Four times a day (QID) | RECTAL | Status: DC | PRN

## 2014-01-22 MED ORDER — ACETAMINOPHEN 325 MG PO TABS
650.0000 mg | ORAL_TABLET | Freq: Four times a day (QID) | ORAL | Status: DC | PRN
  Administered 2014-01-24 – 2014-01-29 (×2): 650 mg via ORAL
  Filled 2014-01-22 (×2): qty 2

## 2014-01-22 MED ORDER — POTASSIUM CHLORIDE ER 10 MEQ PO TBCR
10.0000 meq | EXTENDED_RELEASE_TABLET | Freq: Every day | ORAL | Status: DC
  Administered 2014-01-23 – 2014-01-31 (×9): 10 meq via ORAL
  Filled 2014-01-22 (×10): qty 1

## 2014-01-22 MED ORDER — PIPERACILLIN-TAZOBACTAM 3.375 G IVPB
3.3750 g | Freq: Three times a day (TID) | INTRAVENOUS | Status: DC
  Administered 2014-01-23 – 2014-01-29 (×19): 3.375 g via INTRAVENOUS
  Filled 2014-01-22 (×20): qty 50

## 2014-01-22 MED ORDER — ENOXAPARIN SODIUM 40 MG/0.4ML ~~LOC~~ SOLN
40.0000 mg | SUBCUTANEOUS | Status: DC
  Administered 2014-01-23 – 2014-01-31 (×9): 40 mg via SUBCUTANEOUS
  Filled 2014-01-22 (×9): qty 0.4

## 2014-01-22 MED ORDER — METOPROLOL TARTRATE 50 MG PO TABS
50.0000 mg | ORAL_TABLET | Freq: Every day | ORAL | Status: DC
  Administered 2014-01-24 – 2014-01-26 (×3): 50 mg via ORAL
  Filled 2014-01-22 (×5): qty 1

## 2014-01-22 MED ORDER — VANCOMYCIN HCL IN DEXTROSE 750-5 MG/150ML-% IV SOLN
750.0000 mg | INTRAVENOUS | Status: DC
  Administered 2014-01-23 – 2014-01-27 (×5): 750 mg via INTRAVENOUS
  Filled 2014-01-22 (×6): qty 150

## 2014-01-22 MED ORDER — PANTOPRAZOLE SODIUM 40 MG PO TBEC
40.0000 mg | DELAYED_RELEASE_TABLET | Freq: Every day | ORAL | Status: DC
  Administered 2014-01-23 – 2014-01-31 (×9): 40 mg via ORAL
  Filled 2014-01-22 (×7): qty 1

## 2014-01-22 MED ORDER — LEVOTHYROXINE SODIUM 50 MCG PO TABS
50.0000 ug | ORAL_TABLET | Freq: Every day | ORAL | Status: DC
  Administered 2014-01-23 – 2014-01-25 (×3): 50 ug via ORAL
  Filled 2014-01-22 (×4): qty 1

## 2014-01-22 MED ORDER — TRAMADOL HCL 50 MG PO TABS: 50.0000 mg | ORAL_TABLET | Freq: Four times a day (QID) | ORAL | Status: DC | PRN

## 2014-01-22 MED ORDER — SODIUM CHLORIDE 0.9 % IV SOLN
INTRAVENOUS | Status: AC
  Administered 2014-01-23: via INTRAVENOUS

## 2014-01-22 MED ORDER — POLYETHYLENE GLYCOL 3350 17 G PO PACK
17.0000 g | PACK | Freq: Two times a day (BID) | ORAL | Status: DC
  Administered 2014-01-23 – 2014-01-30 (×11): 17 g via ORAL
  Filled 2014-01-22 (×19): qty 1

## 2014-01-22 MED ORDER — ASPIRIN 81 MG PO CHEW
81.0000 mg | CHEWABLE_TABLET | Freq: Every day | ORAL | Status: DC
  Administered 2014-01-23 – 2014-01-25 (×3): 81 mg via ORAL
  Filled 2014-01-22 (×5): qty 1

## 2014-01-22 MED ORDER — SODIUM CHLORIDE 0.9 % IV BOLUS (SEPSIS)
500.0000 mL | Freq: Once | INTRAVENOUS | Status: AC
  Administered 2014-01-22: 500 mL via INTRAVENOUS

## 2014-01-22 MED ORDER — PIPERACILLIN-TAZOBACTAM 3.375 G IVPB 30 MIN
3.3750 g | Freq: Once | INTRAVENOUS | Status: AC
  Administered 2014-01-22: 3.375 g via INTRAVENOUS
  Filled 2014-01-22: qty 50

## 2014-01-22 MED ORDER — VANCOMYCIN HCL IN DEXTROSE 1-5 GM/200ML-% IV SOLN
1000.0000 mg | Freq: Once | INTRAVENOUS | Status: AC
Start: 2014-01-22 — End: 2014-01-22
  Administered 2014-01-22: 1000 mg via INTRAVENOUS
  Filled 2014-01-22: qty 200

## 2014-01-22 MED ORDER — DM-GUAIFENESIN ER 30-600 MG PO TB12
1.0000 | ORAL_TABLET | Freq: Two times a day (BID) | ORAL | Status: DC
  Administered 2014-01-23 – 2014-01-31 (×17): 1 via ORAL
  Filled 2014-01-22 (×20): qty 1

## 2014-01-22 MED ORDER — DEXTROSE 5 % IV SOLN
500.0000 mg | INTRAVENOUS | Status: DC
  Administered 2014-01-23 – 2014-01-24 (×2): 500 mg via INTRAVENOUS
  Filled 2014-01-22 (×2): qty 500

## 2014-01-22 MED ORDER — ACETAMINOPHEN 500 MG PO TABS
1000.0000 mg | ORAL_TABLET | Freq: Once | ORAL | Status: AC
  Administered 2014-01-22: 1000 mg via ORAL
  Filled 2014-01-22: qty 2

## 2014-01-22 MED ORDER — FLUOXETINE HCL 20 MG PO CAPS
20.0000 mg | ORAL_CAPSULE | Freq: Every day | ORAL | Status: DC
  Administered 2014-01-23 – 2014-01-31 (×9): 20 mg via ORAL
  Filled 2014-01-22 (×10): qty 1

## 2014-01-22 NOTE — H&P (Signed)
Jodi Price is an 80 y.o. female.    Pcp: Adline Mango  Chief Complaint: dyspnea HPI: 80 yo female with dm2, htn, afib, dementia, recent admission for pneumonia apparently was discharged and c/o increasing sob today.  Pt was found to have pox 80% by EMS, and brought to ED for evaluation.  CXR => increase in bilateral infiltrates.  Right >left.  Pt was febrile and with elevation in wbc 11.7 in ED.  Pt noted fever at home, ? Nausea, vomitting.   Denies cp, palp, diarrhea, brbpr, black stool.  Pt declares that she wants to be DNR in front of myself and nursing staff.   Pt will be admitted for healthcare associated pneumonia.   Past Medical History  Diagnosis Date  . Hypertension   . Thyroid disease   . Acid reflux disease   . Depression   . Diabetes mellitus   . Urinary tract infection   . Hypothyroidism   . Blood transfusion     hx reaction  . GERD (gastroesophageal reflux disease)     gastrophoresis  . H/O hiatal hernia   . Cancer   . Arthritis   . Pneumonia   . Atrial fibrillation   . Chronic kidney disease   . Dementia   . Hyperlipidemia   . Peripheral neuropathy   . Umbilical hernia     Past Surgical History  Procedure Laterality Date  . Breast surgery    . Back surgery      1950  . Eye surgery      cataract  . Joint replacement      bilateral knees    History reviewed. No pertinent family history. Social History:  reports that she has never smoked. She has never used smokeless tobacco. She reports that she does not drink alcohol or use illicit drugs.  Allergies:  Allergies  Allergen Reactions  . Influenza Vaccines Anaphylaxis  . Pneumococcal Vaccines Anaphylaxis     (Not in a hospital admission)  Results for orders placed or performed during the hospital encounter of 01/22/14 (from the past 48 hour(s))  CBC     Status: Abnormal   Collection Time: 01/22/14  8:52 PM  Result Value Ref Range   WBC 11.7 (H) 4.0 - 10.5 K/uL   RBC 3.84 (L) 3.87 - 5.11 MIL/uL    Hemoglobin 12.1 12.0 - 15.0 g/dL   HCT 36.8 36.0 - 46.0 %   MCV 95.8 78.0 - 100.0 fL   MCH 31.5 26.0 - 34.0 pg   MCHC 32.9 30.0 - 36.0 g/dL   RDW 13.1 11.5 - 15.5 %   Platelets 287 150 - 400 K/uL  Basic metabolic panel     Status: Abnormal   Collection Time: 01/22/14  8:52 PM  Result Value Ref Range   Sodium 137 137 - 147 mEq/L   Potassium 4.0 3.7 - 5.3 mEq/L   Chloride 97 96 - 112 mEq/L   CO2 25 19 - 32 mEq/L   Glucose, Bld 215 (H) 70 - 99 mg/dL   BUN 11 6 - 23 mg/dL   Creatinine, Ser 0.58 0.50 - 1.10 mg/dL   Calcium 9.4 8.4 - 10.5 mg/dL   GFR calc non Af Amer 78 (L) >90 mL/min   GFR calc Af Amer 90 (L) >90 mL/min    Comment: (NOTE) The eGFR has been calculated using the CKD EPI equation. This calculation has not been validated in all clinical situations. eGFR's persistently <90 mL/min signify possible Chronic Kidney Disease.  Anion gap 15 5 - 15  Pro b natriuretic peptide (BNP)     Status: Abnormal   Collection Time: 01/22/14  8:52 PM  Result Value Ref Range   Pro B Natriuretic peptide (BNP) 1026.0 (H) 0 - 450 pg/mL  I-stat troponin, ED     Status: None   Collection Time: 01/22/14  9:01 PM  Result Value Ref Range   Troponin i, poc 0.01 0.00 - 0.08 ng/mL   Comment 3            Comment: Due to the release kinetics of cTnI, a negative result within the first hours of the onset of symptoms does not rule out myocardial infarction with certainty. If myocardial infarction is still suspected, repeat the test at appropriate intervals.    Dg Chest Port 1 View  01/22/2014   CLINICAL DATA:  Shortness of breath with chest pain and cough for 2 weeks. History of hypertension and diabetes.  EXAM: PORTABLE CHEST - 1 VIEW  COMPARISON:  Radiographs 01/19/2014 and 01/18/2014.  CT 09/10/2012.  FINDINGS: 2114 hr. Compared with the recent radiographs, there is significantly lower lung volumes with progressive interstitial prominence and new right-greater-than-left basilar airspace  opacities. Prior studies have demonstrated underlying pulmonary fibrosis. There is no significant pleural effusion. The heart size and mediastinal contours are stable. Surgical clips are present within the right axilla.  IMPRESSION: Increased interstitial prominence with new right-greater-than-left basilar airspace opacities compared with recent prior studies. Findings suggest edema or pneumonia superimposed on pulmonary fibrosis.   Electronically Signed   By: Camie Patience M.D.   On: 01/22/2014 21:30    Review of Systems  Constitutional: Positive for fever. Negative for chills, weight loss, malaise/fatigue and diaphoresis.  HENT: Negative for congestion, ear discharge, ear pain, hearing loss, nosebleeds, sore throat and tinnitus.   Eyes: Negative for blurred vision, double vision, photophobia, pain, discharge and redness.  Respiratory: Positive for shortness of breath. Negative for hemoptysis, sputum production, wheezing and stridor.   Cardiovascular: Negative for chest pain, palpitations, orthopnea, claudication, leg swelling and PND.  Gastrointestinal: Positive for nausea and vomiting. Negative for heartburn, abdominal pain, diarrhea, constipation, blood in stool and melena.  Genitourinary: Negative for dysuria, urgency, frequency, hematuria and flank pain.  Musculoskeletal: Negative for myalgias, back pain, joint pain, falls and neck pain.  Skin: Negative for itching and rash.  Neurological: Negative for dizziness, tingling, tremors, sensory change, speech change, focal weakness, seizures, loss of consciousness, weakness and headaches.  Endo/Heme/Allergies: Negative for environmental allergies and polydipsia. Does not bruise/bleed easily.  Psychiatric/Behavioral: Positive for memory loss. Negative for depression, suicidal ideas, hallucinations and substance abuse. The patient is not nervous/anxious and does not have insomnia.     Blood pressure 169/82, pulse 93, temperature 102.6 F (39.2 C),  temperature source Rectal, resp. rate 25, SpO2 94 %. Physical Exam  Constitutional: She appears well-developed and well-nourished.  HENT:  Head: Normocephalic and atraumatic.  Eyes: Conjunctivae and EOM are normal. Pupils are equal, round, and reactive to light.  Neck: Normal range of motion. Neck supple. No JVD present. No tracheal deviation present. No thyromegaly present.  Cardiovascular: Normal rate and regular rhythm.  Exam reveals no gallop and no friction rub.   No murmur heard. Respiratory: Effort normal. No stridor. No respiratory distress. She has no wheezes. She has rales. She exhibits no tenderness.  GI: She exhibits no distension. There is no tenderness. There is no rebound and no guarding.  Musculoskeletal: Normal range of motion. She exhibits no  edema or tenderness.  Lymphadenopathy:    She has no cervical adenopathy.  Neurological: She is alert. She has normal reflexes. She displays normal reflexes. No cranial nerve deficit. She exhibits normal muscle tone. Coordination normal.  Skin: Skin is warm and dry. No rash noted. No erythema. No pallor.  Psychiatric: She has a normal mood and affect. Her behavior is normal. Judgment and thought content normal.     Assessment/Plan Dyspnea secondary to  Pneumonia Start on Bonita to dose, zosyn iv pharmacy to dose and zithromax $RemoveBefor'500mg'[id number]$  iv qday Blood culture x2 pending  N/v:   Speech to evaluate to r/o dysphagia Check abdominal xray zofran   Dm2 fsbs ac and qhs, iss  Hypertension: Cont current bp medications  Hypothyroidism Cont levothyroxine  Renner Sebald 01/22/2014, 10:12 PM

## 2014-01-22 NOTE — ED Provider Notes (Signed)
CSN: [id number]     Arrival date & time 01/22/14  2038 History   First MD Initiated Contact with Patient 01/22/14 2039     Chief Complaint  Patient presents with  . Shortness of Breath  . Emesis     (Consider location/radiation/quality/duration/timing/severity/associated sxs/prior Treatment) Patient is a 80 y.o. female presenting with shortness of breath and vomiting. The history is provided by the patient.  Shortness of Breath Severity:  Moderate Onset quality:  Gradual Timing:  Constant Progression:  Unchanged Chronicity:  Recurrent Context: not URI   Relieved by:  Nothing Worsened by:  Nothing tried Associated symptoms: no abdominal pain, no chest pain, no cough, no fever, no rash and no vomiting   Risk factors comment:  Recent hospitalization, admitted for PNA, discharged yesterday on home O2. Emesis Associated symptoms: no abdominal pain     Past Medical History  Diagnosis Date  . Hypertension   . Thyroid disease   . Acid reflux disease   . Depression   . Diabetes mellitus   . Urinary tract infection   . Hypothyroidism   . Blood transfusion     hx reaction  . GERD (gastroesophageal reflux disease)     gastrophoresis  . H/O hiatal hernia   . Cancer   . Arthritis   . Pneumonia   . Atrial fibrillation   . Chronic kidney disease   . Dementia   . Hyperlipidemia   . Peripheral neuropathy   . Umbilical hernia    Past Surgical History  Procedure Laterality Date  . Breast surgery    . Back surgery      1950  . Eye surgery      cataract  . Joint replacement      bilateral knees   No family history on file. History  Substance Use Topics  . Smoking status: Never Smoker   . Smokeless tobacco: Never Used  . Alcohol Use: No   OB History    No data available     Review of Systems  Constitutional: Negative for fever.  Respiratory: Positive for shortness of breath. Negative for cough.   Cardiovascular: Negative for chest pain.  Gastrointestinal:  Negative for vomiting and abdominal pain.  Skin: Negative for rash.  All other systems reviewed and are negative.     Allergies  Influenza vaccines and Pneumococcal vaccines  Home Medications   Prior to Admission medications   Medication Sig Start Date End Date Taking? Authorizing Provider  albuterol (PROVENTIL HFA;VENTOLIN HFA) 108 (90 BASE) MCG/ACT inhaler Inhale 2 puffs into the lungs every 6 (six) hours as needed for wheezing or shortness of breath. Use 2 puffs 3 times daily x 5 days then 2 puffs every 6 hours as needed. 01/21/14   Eugenie Filler, MD  amLODipine (NORVASC) 5 MG tablet Take 5 mg by mouth daily.    Historical Provider, MD  amoxicillin-clavulanate (AUGMENTIN) 875-125 MG per tablet Take 1 tablet by mouth every 12 (twelve) hours. Take for 5 days then stop. 01/21/14   Eugenie Filler, MD  aspirin 81 MG tablet Take 81 mg by mouth daily.    Historical Provider, MD  azithromycin (ZITHROMAX) 250 MG tablet Take 1 tablet daily x 5 days then stop. 01/21/14   Eugenie Filler, MD  BIOTIN PO Take 1 tablet by mouth daily.    Historical Provider, MD  dextromethorphan-guaiFENesin (MUCINEX DM) 30-600 MG per 12 hr tablet Take 1 tablet by mouth 2 (two) times daily. Take for 5 days then  stop. 01/21/14   Eugenie Filler, MD  feeding supplement, GLUCERNA SHAKE, (GLUCERNA SHAKE) LIQD Take 237 mLs by mouth daily. 01/21/14   Eugenie Filler, MD  FLUoxetine (PROZAC) 20 MG capsule Take 20 mg by mouth daily.    Historical Provider, MD  furosemide (LASIX) 10 MG/ML solution Take 10 mg by mouth daily as needed (for swelling).    Historical Provider, MD  levothyroxine (SYNTHROID, LEVOTHROID) 50 MCG tablet Take 50 mcg by mouth daily.    Historical Provider, MD  metoprolol (LOPRESSOR) 50 MG tablet Take 50 mg by mouth daily.    Historical Provider, MD  nitrofurantoin, macrocrystal-monohydrate, (MACROBID) 100 MG capsule Take 1 capsule (100 mg total) by mouth 2 (two) times daily. Resume on 01/27/14.  01/27/14   Eugenie Filler, MD  omeprazole (PRILOSEC) 40 MG capsule Take 40 mg by mouth daily.    Historical Provider, MD  polyethylene glycol (MIRALAX / GLYCOLAX) packet Take 17 g by mouth 2 (two) times daily. 01/21/14   Eugenie Filler, MD  potassium chloride (K-DUR) 10 MEQ tablet Take 10 mEq by mouth daily.    Historical Provider, MD  senna-docusate (SENOKOT S) 8.6-50 MG per tablet Take 1 tablet by mouth at bedtime. 01/21/14   Eugenie Filler, MD  traMADol (ULTRAM) 50 MG tablet Take 50 mg by mouth every 6 (six) hours as needed for moderate pain.     Historical Provider, MD   BP 169/82 mmHg  Pulse 93  Temp(Src) 102.6 F (39.2 C) (Rectal)  Resp 25  SpO2 94% Physical Exam  Constitutional: She is oriented to person, place, and time. She appears well-developed and well-nourished. No distress.  HENT:  Head: Normocephalic and atraumatic.  Mouth/Throat: Oropharynx is clear and moist.  Eyes: EOM are normal. Pupils are equal, round, and reactive to light.  Neck: Normal range of motion. Neck supple.  Cardiovascular: Normal rate and regular rhythm.  Exam reveals no friction rub.   No murmur heard. Pulmonary/Chest: Effort normal and breath sounds normal. No respiratory distress. She has no wheezes. She has no rales.  Abdominal: Soft. She exhibits no distension. There is no tenderness. There is no rebound.  Musculoskeletal: Normal range of motion. She exhibits no edema.  Neurological: She is alert and oriented to person, place, and time.  Skin: No rash noted. She is not diaphoretic.  Nursing note and vitals reviewed.   ED Course  Procedures (including critical care time) Labs Review Labs Reviewed  CBC - Abnormal; Notable for the following:    WBC 11.7 (*)    RBC 3.84 (*)    All other components within normal limits  BASIC METABOLIC PANEL - Abnormal; Notable for the following:    Glucose, Bld 215 (*)    GFR calc non Af Amer 78 (*)    GFR calc Af Amer 90 (*)    All other components  within normal limits  CULTURE, BLOOD (ROUTINE X 2)  CULTURE, BLOOD (ROUTINE X 2)  PRO B NATRIURETIC PEPTIDE  I-STAT TROPOININ, ED    Imaging Review Dg Chest Port 1 View  01/22/2014   CLINICAL DATA:  Shortness of breath with chest pain and cough for 2 weeks. History of hypertension and diabetes.  EXAM: PORTABLE CHEST - 1 VIEW  COMPARISON:  Radiographs 01/19/2014 and 01/18/2014.  CT 09/10/2012.  FINDINGS: 2114 hr. Compared with the recent radiographs, there is significantly lower lung volumes with progressive interstitial prominence and new right-greater-than-left basilar airspace opacities. Prior studies have demonstrated underlying pulmonary fibrosis.  There is no significant pleural effusion. The heart size and mediastinal contours are stable. Surgical clips are present within the right axilla.  IMPRESSION: Increased interstitial prominence with new right-greater-than-left basilar airspace opacities compared with recent prior studies. Findings suggest edema or pneumonia superimposed on pulmonary fibrosis.   Electronically Signed   By: Camie Patience M.D.   On: 01/22/2014 21:30     EKG Interpretation   Date/Time:  Saturday January 22 2014 20:49:00 EST Ventricular Rate:  84 PR Interval:  273 QRS Duration: 85 QT Interval:  365 QTC Calculation: 431 R Axis:   -24 Text Interpretation:  Sinus rhythm Atrial premature complex Prolonged PR  interval Left ventricular hypertrophy Nonspecific T abnormalities, lateral  leads Similar to prior Large amount of artifact Confirmed by Mingo Amber  MD,  Rithika Seel ([id number]) on 01/22/2014 9:05:48 PM      MDM   Final diagnoses:  Shortness of breath  HCAP (healthcare-associated pneumonia)  Fever, unspecified fever cause    49F here with SOB. Gradually worsening throughout the day today. Discharged yesterday after spending a pneumonia admission. Patient initially satting in the mid 80s on her home O2, satting high 90s with 15L NRB with EMS. On exam after getting  hooked up to the monitor, comfortable on 3 L [place], relaxing in the bed, will turn O2 back to home level of 2 L and reassess. Will repeat CXR and labs. CXR shows worsening opacities in bilateral lungs. Will give HCAP antibiotics and admit.  Evelina Bucy, MD 01/22/14 2204

## 2014-01-22 NOTE — ED Notes (Signed)
EMS reports right before patient vomited, her eyes rolled to the back of her head and she started shaking, lasting less than 5 seconds.

## 2014-01-22 NOTE — Progress Notes (Signed)
ANTIBIOTIC CONSULT NOTE - INITIAL  Pharmacy Consult for Vancocin and Zosyn Indication: rule out pneumonia  Allergies  Allergen Reactions  . Influenza Vaccines Anaphylaxis  . Pneumococcal Vaccines Anaphylaxis    Patient Measurements: Height: 4\' 11"  (149.9 cm) Weight: 121 lb 4.1 oz (55 kg) IBW/kg (Calculated) : 43.2  Vital Signs: Temp: 102.6 F (39.2 C) (11/21 2134) Temp Source: Rectal (11/21 2134) BP: 128/63 mmHg (11/21 2230) Pulse Rate: 87 (11/21 2230)  Labs:  Recent Labs  01/20/14 0454 01/21/14 0530 01/22/14 2052  WBC 6.7 6.2 11.7*  HGB 11.1* 10.7* 12.1  PLT 281 261 287  CREATININE 0.80 0.60 0.58   Estimated Creatinine Clearance: 33.9 mL/min (by C-G formula based on Cr of 0.58).   Microbiology: Recent Results (from the past 720 hour(s))  Blood culture (routine x 2)     Status: None (Preliminary result)   Collection Time: 01/18/14  2:58 PM  Result Value Ref Range Status   Specimen Description BLOOD BLOOD RIGHT FOREARM  Final   Special Requests BOTTLES DRAWN AEROBIC AND ANAEROBIC 4 ML  Final   Culture  Setup Time   Final    01/18/2014 18:35 Performed at [facility]    Culture   Final           BLOOD CULTURE RECEIVED NO GROWTH TO DATE CULTURE WILL BE HELD FOR 5 DAYS BEFORE ISSUING A FINAL NEGATIVE REPORT Performed at [facility]    Report Status PENDING  Incomplete  Blood culture (routine x 2)     Status: None (Preliminary result)   Collection Time: 01/18/14  2:59 PM  Result Value Ref Range Status   Specimen Description BLOOD RIGHT WRIST  Final   Special Requests BOTTLES DRAWN AEROBIC AND ANAEROBIC 3 ML  Final   Culture  Setup Time   Final    01/18/2014 18:34 Performed at [facility]    Culture   Final           BLOOD CULTURE RECEIVED NO GROWTH TO DATE CULTURE WILL BE HELD FOR 5 DAYS BEFORE ISSUING A FINAL NEGATIVE REPORT Performed at [facility]    Report Status PENDING  Incomplete    Medical History: Past  Medical History  Diagnosis Date  . Hypertension   . Thyroid disease   . Acid reflux disease   . Depression   . Diabetes mellitus   . Urinary tract infection   . Hypothyroidism   . Blood transfusion     hx reaction  . GERD (gastroesophageal reflux disease)     gastrophoresis  . H/O hiatal hernia   . Cancer   . Arthritis   . Pneumonia   . Atrial fibrillation   . Chronic kidney disease   . Dementia   . Hyperlipidemia   . Peripheral neuropathy   . Umbilical hernia     Medications:  Prescriptions prior to admission  Medication Sig Dispense Refill Last Dose  . albuterol (PROVENTIL HFA;VENTOLIN HFA) 108 (90 BASE) MCG/ACT inhaler Inhale 2 puffs into the lungs every 6 (six) hours as needed for wheezing or shortness of breath. Use 2 puffs 3 times daily x 5 days then 2 puffs every 6 hours as needed. 1 Inhaler 0   . amLODipine (NORVASC) 5 MG tablet Take 5 mg by mouth daily.   01/17/2014 at Unknown time  . amoxicillin-clavulanate (AUGMENTIN) 875-125 MG per tablet Take 1 tablet by mouth every 12 (twelve) hours. Take for 5 days then stop. 10 tablet 0   .  aspirin 81 MG tablet Take 81 mg by mouth daily.   01/17/2014 at Unknown time  . azithromycin (ZITHROMAX) 250 MG tablet Take 1 tablet daily x 5 days then stop. 5 each 0   . BIOTIN PO Take 1 tablet by mouth daily.   unknown at Unknown time  . dextromethorphan-guaiFENesin (MUCINEX DM) 30-600 MG per 12 hr tablet Take 1 tablet by mouth 2 (two) times daily. Take for 5 days then stop. 10 tablet 0   . feeding supplement, GLUCERNA SHAKE, (GLUCERNA SHAKE) LIQD Take 237 mLs by mouth daily.  0   . FLUoxetine (PROZAC) 20 MG capsule Take 20 mg by mouth daily.   01/17/2014 at Unknown time  . furosemide (LASIX) 10 MG/ML solution Take 10 mg by mouth daily as needed (for swelling).   unknown at Unknown time  . levothyroxine (SYNTHROID, LEVOTHROID) 50 MCG tablet Take 50 mcg by mouth daily.   01/17/2014 at Unknown time  . metoprolol (LOPRESSOR) 50 MG tablet Take  50 mg by mouth daily.   01/17/2014 at 2000  . [START ON 01/27/2014] nitrofurantoin, macrocrystal-monohydrate, (MACROBID) 100 MG capsule Take 1 capsule (100 mg total) by mouth 2 (two) times daily. Resume on 01/27/14.     Marland Kitchen omeprazole (PRILOSEC) 40 MG capsule Take 40 mg by mouth daily.   01/17/2014 at Unknown time  . polyethylene glycol (MIRALAX / GLYCOLAX) packet Take 17 g by mouth 2 (two) times daily. 14 each 0   . potassium chloride (K-DUR) 10 MEQ tablet Take 10 mEq by mouth daily.   01/17/2014 at Unknown time  . senna-docusate (SENOKOT S) 8.6-50 MG per tablet Take 1 tablet by mouth at bedtime.     . traMADol (ULTRAM) 50 MG tablet Take 50 mg by mouth every 6 (six) hours as needed for moderate pain.    unknown   Scheduled:  . [START ON 01/23/2014] amLODipine  5 mg Oral Daily  . [START ON 01/23/2014] aspirin  81 mg Oral Daily  . azithromycin  500 mg Intravenous Q24H  . dextromethorphan-guaiFENesin  1 tablet Oral BID  . enoxaparin (LOVENOX) injection  40 mg Subcutaneous Q24H  . [START ON 01/23/2014] FLUoxetine  20 mg Oral Daily  . [START ON 01/23/2014] levothyroxine  50 mcg Oral Daily  . [START ON 01/23/2014] metoprolol  50 mg Oral Daily  . [START ON 01/23/2014] pantoprazole  40 mg Oral Daily  . polyethylene glycol  17 g Oral BID  . [START ON 01/23/2014] potassium chloride  10 mEq Oral Daily   Infusions:  . sodium chloride      Assessment: 80yo female c/o sudden onset of SOB and vomiting, EMS reports pt's eyes rolling back and body shaking x5sec prior to vomiting, concern for aspiration, CXR concerning for edema vs PNA, to begin IV ABX.  Goal of Therapy:  Vancomycin trough level 15-20 mcg/ml  Plan:  Rec'd vanc 1g and Zosyn 3.375g IV in ED; will continue with vancomycin 750mg  IV Q24H and Zosyn 3.375g IV Q8H and monitor CBC, Cx, levels prn.  Wynona Neat, PharmD, BCPS  01/22/2014,11:40 PM

## 2014-01-22 NOTE — ED Notes (Signed)
Pt's O2 sat dropped to 89%. This RN turned the pt's O2 to 4L. Pt is satting 92%.

## 2014-01-22 NOTE — ED Notes (Signed)
Pt continues to be short of breath. Pt has trouble speaking in complete sentences. This RN turned the pt's O2 up to 3L. Pt is satting 93%.

## 2014-01-22 NOTE — ED Notes (Signed)
PER EMS: pt from home, pressed her medical alert necklace because she was having SOB and vomiting sudden onset at 2000. Denies CP. EMS reports pt was on 2L [place] and sats were at 86% and so they placed her on NRB and O2 increased to 99%. Diagnosed with CAP at [facility]. A&Ox4. CBG-161, BP-162/88. HR-90, hx of afib.

## 2014-01-23 ENCOUNTER — Encounter (HOSPITAL_COMMUNITY): Payer: Self-pay | Admitting: *Deleted

## 2014-01-23 DIAGNOSIS — R0602 Shortness of breath: Secondary | ICD-10-CM

## 2014-01-23 LAB — GLUCOSE, CAPILLARY
GLUCOSE-CAPILLARY: 213 mg/dL — AB (ref 70–99)
Glucose-Capillary: 149 mg/dL — ABNORMAL HIGH (ref 70–99)
Glucose-Capillary: 128 mg/dL — ABNORMAL HIGH (ref 70–99)
Glucose-Capillary: 104 mg/dL — ABNORMAL HIGH (ref 70–99)
Glucose-Capillary: 83 mg/dL (ref 70–99)
Glucose-Capillary: 149 mg/dL — ABNORMAL HIGH (ref 70–99)

## 2014-01-23 LAB — COMPREHENSIVE METABOLIC PANEL
ALBUMIN: 2.1 g/dL — AB (ref 3.5–5.2)
ALT: 8 U/L (ref 0–35)
AST: 19 U/L (ref 0–37)
Alkaline Phosphatase: 46 U/L (ref 39–117)
Anion gap: 16 — ABNORMAL HIGH (ref 5–15)
BILIRUBIN TOTAL: 0.6 mg/dL (ref 0.3–1.2)
BUN: 11 mg/dL (ref 6–23)
CHLORIDE: 95 meq/L — AB (ref 96–112)
CO2: 23 mEq/L (ref 19–32)
CREATININE: 0.56 mg/dL (ref 0.50–1.10)
Calcium: 8.5 mg/dL (ref 8.4–10.5)
GFR calc Af Amer: >90 mL/min (ref >90)
GFR, EST NON AFRICAN AMERICAN: 79 mL/min — AB (ref >90)
Glucose, Bld: 183 mg/dL — ABNORMAL HIGH (ref 70–99)
Potassium: 3.9 mEq/L (ref 3.7–5.3)
Sodium: 134 mEq/L — ABNORMAL LOW (ref 137–147)
Total Protein: 6.3 g/dL (ref 6.0–8.3)

## 2014-01-23 LAB — CBC WITH DIFFERENTIAL/PLATELET
BASOS PCT: 0 % (ref 0–1)
Basophils Absolute: 0 10*3/uL (ref 0.0–0.1)
Eosinophils Absolute: 0.1 10*3/uL (ref 0.0–0.7)
Eosinophils Relative: 1 % (ref 0–5)
HCT: 30.7 % — ABNORMAL LOW (ref 36.0–46.0)
HEMOGLOBIN: 10.1 g/dL — AB (ref 12.0–15.0)
Lymphocytes Relative: 8 % — ABNORMAL LOW (ref 12–46)
Lymphs Abs: 0.9 10*3/uL (ref 0.7–4.0)
MCH: 31.2 pg (ref 26.0–34.0)
MCHC: 32.9 g/dL (ref 30.0–36.0)
MCV: 94.8 fL (ref 78.0–100.0)
MONO ABS: 0.4 10*3/uL (ref 0.1–1.0)
MONOS PCT: 4 % (ref 3–12)
NEUTROS ABS: 9.8 10*3/uL — AB (ref 1.7–7.7)
Neutrophils Relative %: 87 % — ABNORMAL HIGH (ref 43–77)
Platelets: 252 10*3/uL (ref 150–400)
RBC: 3.24 MIL/uL — ABNORMAL LOW (ref 3.87–5.11)
RDW: 12.9 % (ref 11.5–15.5)
WBC: 11.2 10*3/uL — ABNORMAL HIGH (ref 4.0–10.5)

## 2014-01-23 NOTE — Evaluation (Signed)
Clinical/Bedside Swallow Evaluation Patient Details  Name: Jodi Price MRN: [id number] Date of Birth: [birth year]  Today's Date: 01/23/2014 Time: 6160-7371 SLP Time Calculation (min) (ACUTE ONLY): 20 min  Past Medical History:  Past Medical History  Diagnosis Date  . Hypertension   . Thyroid disease   . Acid reflux disease   . Depression   . Diabetes mellitus   . Urinary tract infection   . Hypothyroidism   . Blood transfusion     hx reaction  . GERD (gastroesophageal reflux disease)     gastrophoresis  . H/O hiatal hernia   . Cancer   . Arthritis   . Pneumonia   . Atrial fibrillation   . Chronic kidney disease   . Dementia   . Hyperlipidemia   . Peripheral neuropathy   . Umbilical hernia    Past Surgical History:  Past Surgical History  Procedure Laterality Date  . Breast surgery    . Back surgery      1950  . Eye surgery      cataract  . Joint replacement      bilateral knees   HPI:  80 yo female with dm2, htn, afib, dementia,GERD, hiatal hernia, recent admission for pneumonia apparently was discharged and c/o increasing sob today. Pt was found to have pox 80% by EMS, and brought to ED for evaluation. CXR => increase in bilateral infiltrates. Right >left. Swallow eval ordered to r/o dysphagia in light of recurrent PNA.    Assessment / Plan / Recommendation Clinical Impression  Patient presents with a functional appearing oropharyngeal swallow without overt evidence of dysphagia. Family confirm h/o GERD, esophageal stenosis s/p multiple dilations and hiatal hernia which combined with advanced age and daughter reported poor positioning for meals at home prior to admission. Discussed results and recommendations with daughter who wishes to take an aggressive approach to evaluating swallow given recurrent PNA. Will proceed with MBS next date.      Aspiration Risk  Mild    Diet Recommendation Dysphagia 3 (Mechanical Soft);Thin liquid   Liquid Administration via:  Cup;Straw Medication Administration: Whole meds with liquid Supervision: Patient able to self feed;Intermittent supervision to cue for compensatory strategies Compensations: Slow rate;Small sips/bites Postural Changes and/or Swallow Maneuvers: Seated upright 90 degrees;Upright 30-60 min after meal    Other  Recommendations Recommended Consults: MBS Oral Care Recommendations: Oral care BID   Follow Up Recommendations   (TBD, suspect none)       Pertinent Vitals/Pain n/a     Swallow Study    General HPI: 80 yo female with dm2, htn, afib, dementia,GERD, hiatal hernia, recent admission for pneumonia apparently was discharged and c/o increasing sob today. Pt was found to have pox 80% by EMS, and brought to ED for evaluation. CXR => increase in bilateral infiltrates. Right >left. Swallow eval ordered to r/o dysphagia in light of recurrent PNA.  Type of Study: Bedside swallow evaluation Previous Swallow Assessment: none noted Diet Prior to this Study: NPO Temperature Spikes Noted: Yes Respiratory Status: Nasal cannula History of Recent Intubation: No Behavior/Cognition: Alert;Cooperative;Pleasant mood Oral Cavity - Dentition: Dentures, not available Self-Feeding Abilities: Able to feed self Patient Positioning: Upright in bed Baseline Vocal Quality: Clear Volitional Cough: Strong Volitional Swallow: Able to elicit    Oral/Motor/Sensory Function Overall Oral Motor/Sensory Function: Appears within functional limits for tasks assessed   Ice Chips Ice chips: Not tested   Thin Liquid Thin Liquid: Within functional limits Presentation: Cup;Self Fed;Straw    Nectar Thick Nectar Thick  Liquid: Not tested   Honey Thick Honey Thick Liquid: Not tested   Puree Puree: Within functional limits Presentation: Spoon;Self Fed   Solid   GO   Jodi Chervenak MA, CCC-SLP [phone]  Solid: Within functional limits (moistened due to missing dentition) Presentation: Spoon;Self Fed       Jodi Price 01/23/2014,5:01 PM

## 2014-01-23 NOTE — Progress Notes (Signed)
Patient arrived to the floor awake and oriented x 4. Denies c/o pain at present time. Maintained on telemetry and is in first degree AV block with a heart rate of 74. Patient moves all extremities, and is up with the assistance of one person. Lungs are diminished t/o all fields. Maintained on 4 liters of oxygen via nasal cannula. Patient oriented to room and call bell system. Will continue to monitor.  Esperanza Heir, RN

## 2014-01-23 NOTE — Progress Notes (Signed)
Patient is sleeping peacefully. Denies c/o pain/discomfort at present time. Maintained on telemetry and is in 1st degree AV block on the monitor with a heart rate of 68. Will continue to monitor.  Esperanza Heir, RN

## 2014-01-23 NOTE — Progress Notes (Signed)
[facility] PROGRESS NOTE  Jodi Price LEX:[id number] DOB: April 14, 1919 DOA: 01/22/2014 PCP: Jodi Mango, MD  Assessment/Plan: 1. HCAP -continue Vanc/Zosyn -FU Blood Cx, cut down IVF -Swallow eval -mucinex  2. N/V -transient, resolved -resume diet pending speech eval  -KUB with constipation  3. DM2 -stable, SSI  4. Hypertension: -stable, continue amlodipine  5. Hypothyroidism -Cont levothyroxine  6. chronic atrial fibrillation -continue Lopressor for rate control and aspirin  Code Status: Full Code Family Communication: none at bedside Disposition Plan: home vs SNF  HPI/Subjective: Breathing better, just DCed home from [facility] yetserday  Objective: Filed Vitals:   01/23/14 1033  BP: 94/50  Pulse:   Temp:   Resp:     Intake/Output Summary (Last 24 hours) at 01/23/14 1124 Last data filed at 01/23/14 0900  Gross per 24 hour  Intake      0 ml  Output    201 ml  Net   -201 ml   Filed Weights   01/22/14 2300 01/22/14 2340 01/23/14 0659  Weight: 55 kg (121 lb 4.1 oz) 57.1 kg (125 lb 14.1 oz) 58.7 kg (129 lb 6.6 oz)    Exam:   General:  AAOx3, frail elderly female  Cardiovascular: S1S2/RRR  Respiratory: basilar crackles  Abdomen: soft, NT, BS present  Musculoskeletal: no edema c/c   Data Reviewed: Basic Metabolic Panel:  Recent Labs Lab 01/19/14 0850 01/20/14 0454 01/21/14 0530 01/22/14 2052 01/23/14 0427  NA 134* 139 136* 137 134*  K 3.4* 5.2 3.8 4.0 3.9  CL 98 102 98 97 95*  CO2 23 27 25 25 23   GLUCOSE 130* 148* 150* 215* 183*  BUN 10 14 12 11 11   CREATININE 0.59 0.80 0.60 0.58 0.56  CALCIUM 9.1 9.3 9.2 9.4 8.5   Liver Function Tests:  Recent Labs Lab 01/23/14 0427  AST 19  ALT 8  ALKPHOS 46  BILITOT 0.6  PROT 6.3  ALBUMIN 2.1*   No results for input(s): LIPASE, AMYLASE in the last 168 hours. No results for input(s): AMMONIA in the last 168 hours. CBC:  Recent Labs Lab 01/18/14 1247 01/19/14 0850 01/20/14 0454  01/21/14 0530 01/22/14 2052 01/23/14 0427  WBC 7.8 6.0 6.7 6.2 11.7* 11.2*  NEUTROABS 5.6 3.5  --   --   --  9.8*  HGB 13.0 11.7* 11.1* 10.7* 12.1 10.1*  HCT 37.5 34.1* 33.9* 32.9* 36.8 30.7*  MCV 93.1 93.9 96.6 96.8 95.8 94.8  PLT 292 259 281 261 287 252   Cardiac Enzymes:  Recent Labs Lab 01/18/14 1247  TROPONINI <0.30   BNP (last 3 results)  Recent Labs  01/18/14 1247 01/19/14 0850 01/22/14 2052  PROBNP 917.6* 889.5* 1026.0*   CBG:  Recent Labs Lab 01/21/14 0758 01/21/14 1208 01/23/14 0045 01/23/14 0621 01/23/14 0858  GLUCAP 128* 145* 213* 149* 128*    Recent Results (from the past 240 hour(s))  Blood culture (routine x 2)     Status: None (Preliminary result)   Collection Time: 01/18/14  2:58 PM  Result Value Ref Range Status   Specimen Description BLOOD BLOOD RIGHT FOREARM  Final   Special Requests BOTTLES DRAWN AEROBIC AND ANAEROBIC 4 ML  Final   Culture  Setup Time   Final    01/18/2014 18:35 Performed at [facility]    Culture   Final           BLOOD CULTURE RECEIVED NO GROWTH TO DATE CULTURE WILL BE HELD FOR 5 DAYS BEFORE ISSUING A FINAL NEGATIVE REPORT  Performed at [facility]    Report Status PENDING  Incomplete  Blood culture (routine x 2)     Status: None (Preliminary result)   Collection Time: 01/18/14  2:59 PM  Result Value Ref Range Status   Specimen Description BLOOD RIGHT WRIST  Final   Special Requests BOTTLES DRAWN AEROBIC AND ANAEROBIC 3 ML  Final   Culture  Setup Time   Final    01/18/2014 18:34 Performed at [facility]    Culture   Final           BLOOD CULTURE RECEIVED NO GROWTH TO DATE CULTURE WILL BE HELD FOR 5 DAYS BEFORE ISSUING A FINAL NEGATIVE REPORT Performed at [facility]    Report Status PENDING  Incomplete     Studies: Dg Chest Port 1 View  01/22/2014   CLINICAL DATA:  Shortness of breath with chest pain and cough for 2 weeks. History of hypertension and diabetes.  EXAM:  PORTABLE CHEST - 1 VIEW  COMPARISON:  Radiographs 01/19/2014 and 01/18/2014.  CT 09/10/2012.  FINDINGS: 2114 hr. Compared with the recent radiographs, there is significantly lower lung volumes with progressive interstitial prominence and new right-greater-than-left basilar airspace opacities. Prior studies have demonstrated underlying pulmonary fibrosis. There is no significant pleural effusion. The heart size and mediastinal contours are stable. Surgical clips are present within the right axilla.  IMPRESSION: Increased interstitial prominence with new right-greater-than-left basilar airspace opacities compared with recent prior studies. Findings suggest edema or pneumonia superimposed on pulmonary fibrosis.   Electronically Signed   By: Jodi Price M.D.   On: 01/22/2014 21:30    Scheduled Meds: . amLODipine  5 mg Oral Daily  . aspirin  81 mg Oral Daily  . azithromycin  500 mg Intravenous Q24H  . dextromethorphan-guaiFENesin  1 tablet Oral BID  . enoxaparin (LOVENOX) injection  40 mg Subcutaneous Q24H  . FLUoxetine  20 mg Oral Daily  . levothyroxine  50 mcg Oral Daily  . metoprolol  50 mg Oral Daily  . pantoprazole  40 mg Oral Daily  . piperacillin-tazobactam (ZOSYN)  IV  3.375 g Intravenous Q8H  . polyethylene glycol  17 g Oral BID  . potassium chloride  10 mEq Oral Daily  . vancomycin  750 mg Intravenous Q24H   Continuous Infusions: . sodium chloride 75 mL/hr at 01/23/14 0024   Antibiotics Given (last 72 hours)    Date/Time Action Medication Dose Rate   01/23/14 0039 Given   azithromycin (ZITHROMAX) 500 mg in dextrose 5 % 250 mL IVPB 500 mg 250 mL/hr   01/23/14 0444 Given   piperacillin-tazobactam (ZOSYN) IVPB 3.375 g 3.375 g 12.5 mL/hr      Active Problems:   DM (diabetes mellitus)   HTN (hypertension)   Pneumonia   Healthcare-associated pneumonia    Time spent: 70min    Jodi Price  [facility] Pager [phone]. If 7PM-7AM, please contact night-coverage at  [email or web address], password [email or web address] 01/23/2014, 11:24 AM  LOS: 1 day

## 2014-01-24 ENCOUNTER — Encounter (HOSPITAL_COMMUNITY): Payer: Self-pay

## 2014-01-24 ENCOUNTER — Inpatient Hospital Stay (HOSPITAL_COMMUNITY): Payer: Medicare Other

## 2014-01-24 DIAGNOSIS — E089 Diabetes mellitus due to underlying condition without complications: Secondary | ICD-10-CM

## 2014-01-24 LAB — CULTURE, BLOOD (ROUTINE X 2)
Culture: NO GROWTH
Culture: NO GROWTH

## 2014-01-24 LAB — GLUCOSE, CAPILLARY
GLUCOSE-CAPILLARY: 116 mg/dL — AB (ref 70–99)
GLUCOSE-CAPILLARY: 124 mg/dL — AB (ref 70–99)
Glucose-Capillary: 121 mg/dL — ABNORMAL HIGH (ref 70–99)
Glucose-Capillary: 146 mg/dL — ABNORMAL HIGH (ref 70–99)
Glucose-Capillary: 139 mg/dL — ABNORMAL HIGH (ref 70–99)

## 2014-01-24 MED ORDER — INSULIN ASPART 100 UNIT/ML ~~LOC~~ SOLN
0.0000 [IU] | Freq: Three times a day (TID) | SUBCUTANEOUS | Status: DC
  Administered 2014-01-25 – 2014-01-26 (×6): 1 [IU] via SUBCUTANEOUS
  Administered 2014-01-27: 2 [IU] via SUBCUTANEOUS
  Administered 2014-01-27 – 2014-01-28 (×3): 1 [IU] via SUBCUTANEOUS
  Administered 2014-01-29 (×2): 3 [IU] via SUBCUTANEOUS
  Administered 2014-01-29: 2 [IU] via SUBCUTANEOUS
  Administered 2014-01-30: 5 [IU] via SUBCUTANEOUS
  Administered 2014-01-30: 3 [IU] via SUBCUTANEOUS
  Administered 2014-01-30 – 2014-01-31 (×2): 2 [IU] via SUBCUTANEOUS
  Administered 2014-01-31: 3 [IU] via SUBCUTANEOUS

## 2014-01-24 NOTE — Progress Notes (Signed)
Dr. Carles Collet notified that patient remains on CBG checks every 4 hours and that no SSI currently ordered.  SSI and ACHS CBGs ordered.  Will update patient and continue to monitor.

## 2014-01-24 NOTE — Progress Notes (Signed)
Utilization review completed. Rasool Rommel, RN, BSN. 

## 2014-01-24 NOTE — Clinical Documentation Improvement (Signed)
Please clarify if N/V and concern for aspiration in setting of dysphagia and pneumonia can be further specified as one of the diagnoses listed below and document in pn or d/c summary.   Possible Clinical Conditions?    Aspiration Pneumonia (POA?)  Gram Negative Pneumonia (POA?)  Klebsiella PNA  E Coli PNA  Pseudomonas PNA  Other Condition  Cannot Clinically Determine   Supporting Information:  Risk Factors: vomiting, EMS reports pt's eyes rolling back and body shaking x5sec prior to vomiting, concern for aspiration, per RPh note 01/22/2014 N/V per H/P Patient presents with a mild pharyngeal phase dysphagia characterized by a delay in swallow initiation resulting in trace but silent aspiration of thin liquids per SLP note 01/24/14  Signs & Symptoms Diagnostics: CXR shows worsening opacities in bilateral lungs. Will give HCAP antibiotics and admit. Per ED Component      WBC  Latest Ref Rng      4.0 - 10.5 K/uL  01/22/2014     8:52 PM 11.7 (H)   Component      WBC  Latest Ref Rng      4.0 - 10.5 K/uL  01/23/2014     4:27 AM 11.2 (H)   Treatment: This RN turned the pt's O2 up to 3L per Maintained on 4 liters of oxygen via nasal cannula per 01/22/2014   Thank You, Heloise Beecham ,RN Clinical Documentation Specialist:  [facility]

## 2014-01-24 NOTE — Procedures (Signed)
Objective Swallowing Evaluation: Modified Barium Swallowing Study  Patient Details  Name: Jodi Price MRN: [id number] Date of Birth: 25-Oct-1919  Today's Date: 01/24/2014 Time: 1035-1054 SLP Time Calculation (min) (ACUTE ONLY): 19 min  Past Medical History:  Past Medical History  Diagnosis Date  . Hypertension   . Thyroid disease   . Acid reflux disease   . Depression   . Diabetes mellitus   . Urinary tract infection   . Hypothyroidism   . Blood transfusion     hx reaction  . GERD (gastroesophageal reflux disease)     gastrophoresis  . H/O hiatal hernia   . Cancer   . Arthritis   . Pneumonia   . Atrial fibrillation   . Chronic kidney disease   . Dementia   . Hyperlipidemia   . Peripheral neuropathy   . Umbilical hernia    Past Surgical History:  Past Surgical History  Procedure Laterality Date  . Breast surgery    . Back surgery      1950  . Eye surgery      cataract  . Joint replacement      bilateral knees   HPI:  80 yo female with dm2, htn, afib, dementia,GERD, hiatal hernia, recent admission for pneumonia apparently was discharged and c/o increasing sob today. Pt was found to have pox 80% by EMS, and brought to ED for evaluation. CXR => increase in bilateral infiltrates. Right >left. Swallow eval ordered to r/o dysphagia in light of recurrent PNA.      Assessment / Plan / Recommendation Clinical Impression  Dysphagia Diagnosis: Mild pharyngeal phase dysphagia Clinical impression: Patient presents with a mild pharyngeal phase dysphagia characterized by a delay in swallow initiation resulting in trace but silent aspiration of thin liquids. Therapuetic intervention including verbal cueing for small single cup sips rather than use of straw eliminated penetration/aspiration episodes during todays study. Pharyngeal strength WFL. No observed backflow of bolus noted from esophagus with appearance of intermittently mildly delayed transit of bolus through cervical  esophagus consistent with history. Recommend continuation of current diet without use of straws to decrease aspiration risk. SLP will f/u briefly at bedside for education prior to d/c.     Treatment Recommendation  Therapy as outlined in treatment plan below    Diet Recommendation Dysphagia 3 (Mechanical Soft);Thin liquid   Liquid Administration via: Cup;No straw Medication Administration: Whole meds with liquid (one at a time) Supervision: Patient able to self feed;Intermittent supervision to cue for compensatory strategies Compensations: Slow rate;Small sips/bites Postural Changes and/or Swallow Maneuvers: Seated upright 90 degrees;Upright 30-60 min after meal    Other  Recommendations Oral Care Recommendations: Oral care BID   Follow Up Recommendations  None    Frequency and Duration min 1 x/week  1 week           General HPI: 80 yo female with dm2, htn, afib, dementia,GERD, hiatal hernia, recent admission for pneumonia apparently was discharged and c/o increasing sob today. Pt was found to have pox 80% by EMS, and brought to ED for evaluation. CXR => increase in bilateral infiltrates. Right >left. Swallow eval ordered to r/o dysphagia in light of recurrent PNA.  Type of Study: Modified Barium Swallowing Study Reason for Referral: Objectively evaluate swallowing function Previous Swallow Assessment: none noted Diet Prior to this Study: Dysphagia 3 (soft);Thin liquids Temperature Spikes Noted: No Respiratory Status: Nasal cannula History of Recent Intubation: No Behavior/Cognition: Alert;Cooperative;Pleasant mood Oral Cavity - Dentition: Dentures, not available Oral Motor / Sensory  Function: Within functional limits Self-Feeding Abilities: Able to feed self Patient Positioning: Upright in chair Baseline Vocal Quality: Clear Volitional Cough: Strong Volitional Swallow: Able to elicit Anatomy: Within functional limits Pharyngeal Secretions: Not observed secondary MBS     Reason for Referral Objectively evaluate swallowing function   Oral Phase Oral Preparation/Oral Phase Oral Phase: WFL   Pharyngeal Phase Pharyngeal Phase Pharyngeal Phase: Impaired Pharyngeal - Thin Pharyngeal - Thin Cup: Delayed swallow initiation;Premature spillage to valleculae Pharyngeal - Thin Straw: Delayed swallow initiation;Premature spillage to pyriform sinuses;Penetration/Aspiration before swallow;Trace aspiration Penetration/Aspiration details (thin straw): Material enters airway, passes BELOW cords without attempt by patient to eject out (silent aspiration);Material enters airway, CONTACTS cords and not ejected out Pharyngeal - Solids Pharyngeal - Puree: Delayed swallow initiation;Premature spillage to valleculae Pharyngeal - Mechanical Soft: Delayed swallow initiation;Premature spillage to valleculae Pharyngeal - Pill: Within functional limits (provided whole with thin liquid)  Cervical Esophageal Phase    GO    Cervical Esophageal Phase Cervical Esophageal Phase: Impaired Cervical Esophageal Phase - Comment Cervical Esophageal Comment: appearance of intermittent delayed transit through cervical esophagus noted        Gabriel Rainwater MA, CCC-SLP [phone]  Alon Mazor Meryl 01/24/2014, 11:07 AM

## 2014-01-24 NOTE — Progress Notes (Signed)
[facility] PROGRESS NOTE  Jodi Price WCH:[id number] DOB: May 20, 1919 DOA: 01/22/2014 PCP: Adline Mango, MD  Assessment/Plan: 1. HCAP -continue Vanc/Zosyn Day 2 -FU Blood Cx-NGTD, cut down IVF -Swallow eval completed, on D3 diet, for MBS today -mucinex  2. N/V -transient, resolved -resumed diet  -KUB with constipation  3. DM2 -stable, SSI  4. Hypertension: -stable, continue amlodipine  5. Hypothyroidism -Cont levothyroxine  6. chronic atrial fibrillation -continue Lopressor for rate control and aspirin  Ambulate, Pt/OT  Code Status: Full Code Family Communication: none at bedside Disposition Plan: home vs SNF  HPI/Subjective: Breathing better, just [facility] home from [facility] 11/21  Objective: Filed Vitals:   01/24/14 0537  BP: 146/63  Pulse: 72  Temp: 98.1 F (36.7 C)  Resp: 18    Intake/Output Summary (Last 24 hours) at 01/24/14 1229 Last data filed at 01/24/14 1205  Gross per 24 hour  Intake    860 ml  Output   2151 ml  Net  -1291 ml   Filed Weights   01/22/14 2340 01/23/14 0659 01/24/14 0544  Weight: 57.1 kg (125 lb 14.1 oz) 58.7 kg (129 lb 6.6 oz) 56.79 kg (125 lb 3.2 oz)    Exam:   General:  AAOx3, frail elderly female, no distress  Cardiovascular: S1S2/RRR  Respiratory: basilar crackles  Abdomen: soft, NT, BS present  Musculoskeletal: no edema c/c   Data Reviewed: Basic Metabolic Panel:  Recent Labs Lab 01/19/14 0850 01/20/14 0454 01/21/14 0530 01/22/14 2052 01/23/14 0427  NA 134* 139 136* 137 134*  K 3.4* 5.2 3.8 4.0 3.9  CL 98 102 98 97 95*  CO2 23 27 25 25 23   GLUCOSE 130* 148* 150* 215* 183*  BUN 10 14 12 11 11   CREATININE 0.59 0.80 0.60 0.58 0.56  CALCIUM 9.1 9.3 9.2 9.4 8.5   Liver Function Tests:  Recent Labs Lab 01/23/14 0427  AST 19  ALT 8  ALKPHOS 46  BILITOT 0.6  PROT 6.3  ALBUMIN 2.1*   No results for input(s): LIPASE, AMYLASE in the last 168 hours. No results for input(s): AMMONIA in the last  168 hours. CBC:  Recent Labs Lab 01/18/14 1247 01/19/14 0850 01/20/14 0454 01/21/14 0530 01/22/14 2052 01/23/14 0427  WBC 7.8 6.0 6.7 6.2 11.7* 11.2*  NEUTROABS 5.6 3.5  --   --   --  9.8*  HGB 13.0 11.7* 11.1* 10.7* 12.1 10.1*  HCT 37.5 34.1* 33.9* 32.9* 36.8 30.7*  MCV 93.1 93.9 96.6 96.8 95.8 94.8  PLT 292 259 281 261 287 252   Cardiac Enzymes:  Recent Labs Lab 01/18/14 1247  TROPONINI <0.30   BNP (last 3 results)  Recent Labs  01/18/14 1247 01/19/14 0850 01/22/14 2052  PROBNP 917.6* 889.5* 1026.0*   CBG:  Recent Labs Lab 01/23/14 1617 01/23/14 2010 01/24/14 0022 01/24/14 0436 01/24/14 1128  GLUCAP 83 149* 116* 124* 121*    Recent Results (from the past 240 hour(s))  Blood culture (routine x 2)     Status: None   Collection Time: 01/18/14  2:58 PM  Result Value Ref Range Status   Specimen Description BLOOD BLOOD RIGHT FOREARM  Final   Special Requests BOTTLES DRAWN AEROBIC AND ANAEROBIC 4 ML  Final   Culture  Setup Time   Final    01/18/2014 18:35 Performed at [facility]   Final    NO GROWTH 5 DAYS Performed at [facility]    Report Status 01/24/2014 FINAL  Final  Blood culture (routine x 2)     Status: None   Collection Time: 01/18/14  2:59 PM  Result Value Ref Range Status   Specimen Description BLOOD RIGHT WRIST  Final   Special Requests BOTTLES DRAWN AEROBIC AND ANAEROBIC 3 ML  Final   Culture  Setup Time   Final    01/18/2014 18:34 Performed at [facility]   Final    NO GROWTH 5 DAYS Performed at [facility]    Report Status 01/24/2014 FINAL  Final  Blood culture (routine x 2)     Status: None (Preliminary result)   Collection Time: 01/22/14 10:16 PM  Result Value Ref Range Status   Specimen Description BLOOD RIGHT FOREARM  Final   Special Requests BOTTLES DRAWN AEROBIC AND ANAEROBIC 10CC EA  Final   Culture  Setup Time   Final    01/23/2014 12:25 Performed at [facility]    Culture   Final           BLOOD CULTURE RECEIVED NO GROWTH TO DATE CULTURE WILL BE HELD FOR 5 DAYS BEFORE ISSUING A FINAL NEGATIVE REPORT Performed at [facility]    Report Status PENDING  Incomplete  Blood culture (routine x 2)     Status: None (Preliminary result)   Collection Time: 01/22/14 10:17 PM  Result Value Ref Range Status   Specimen Description BLOOD LEFT FOREARM  Final   Special Requests BOTTLES DRAWN AEROBIC AND ANAEROBIC 5.5CC EA  Final   Culture  Setup Time   Final    01/23/2014 11:51 Performed at [facility]    Culture   Final           BLOOD CULTURE RECEIVED NO GROWTH TO DATE CULTURE WILL BE HELD FOR 5 DAYS BEFORE ISSUING A FINAL NEGATIVE REPORT Performed at [facility]    Report Status PENDING  Incomplete     Studies: Dg Chest Port 1 View  01/22/2014   CLINICAL DATA:  Shortness of breath with chest pain and cough for 2 weeks. History of hypertension and diabetes.  EXAM: PORTABLE CHEST - 1 VIEW  COMPARISON:  Radiographs 01/19/2014 and 01/18/2014.  CT 09/10/2012.  FINDINGS: 2114 hr. Compared with the recent radiographs, there is significantly lower lung volumes with progressive interstitial prominence and new right-greater-than-left basilar airspace opacities. Prior studies have demonstrated underlying pulmonary fibrosis. There is no significant pleural effusion. The heart size and mediastinal contours are stable. Surgical clips are present within the right axilla.  IMPRESSION: Increased interstitial prominence with new right-greater-than-left basilar airspace opacities compared with recent prior studies. Findings suggest edema or pneumonia superimposed on pulmonary fibrosis.   Electronically Signed   By: Camie Patience M.D.   On: 01/22/2014 21:30    Scheduled Meds: . amLODipine  5 mg Oral Daily  . aspirin  81 mg Oral Daily  . azithromycin  500 mg Intravenous Q24H  . dextromethorphan-guaiFENesin  1 tablet Oral BID  .  enoxaparin (LOVENOX) injection  40 mg Subcutaneous Q24H  . FLUoxetine  20 mg Oral Daily  . levothyroxine  50 mcg Oral Daily  . metoprolol  50 mg Oral Daily  . pantoprazole  40 mg Oral Daily  . piperacillin-tazobactam (ZOSYN)  IV  3.375 g Intravenous Q8H  . polyethylene glycol  17 g Oral BID  . potassium chloride  10 mEq Oral Daily  . vancomycin  750 mg Intravenous Q24H   Continuous Infusions:   Antibiotics Given (  last 72 hours)    Date/Time Action Medication Dose Rate   01/23/14 0039 Given   azithromycin (ZITHROMAX) 500 mg in dextrose 5 % 250 mL IVPB 500 mg 250 mL/hr   01/23/14 0444 Given   piperacillin-tazobactam (ZOSYN) IVPB 3.375 g 3.375 g 12.5 mL/hr   01/23/14 1223 Given   piperacillin-tazobactam (ZOSYN) IVPB 3.375 g 3.375 g 12.5 mL/hr   01/23/14 2007 Given   piperacillin-tazobactam (ZOSYN) IVPB 3.375 g 3.375 g 12.5 mL/hr   01/23/14 2109 Given   vancomycin (VANCOCIN) IVPB 750 mg/150 ml premix 750 mg 150 mL/hr   01/24/14 0124 Given   azithromycin (ZITHROMAX) 500 mg in dextrose 5 % 250 mL IVPB 500 mg 250 mL/hr   01/24/14 0456 Given   piperacillin-tazobactam (ZOSYN) IVPB 3.375 g 3.375 g 12.5 mL/hr   01/24/14 1114 Given   piperacillin-tazobactam (ZOSYN) IVPB 3.375 g 3.375 g 12.5 mL/hr      Active Problems:   DM (diabetes mellitus)   HTN (hypertension)   Pneumonia   Healthcare-associated pneumonia   Shortness of breath    Time spent: 28min    [facility] Pager [phone]. If 7PM-7AM, please contact night-coverage at [email or web address], password [facility] 01/24/2014, 12:29 PM  LOS: 2 days

## 2014-01-25 LAB — BASIC METABOLIC PANEL
Anion gap: 11 (ref 5–15)
BUN: 7 mg/dL (ref 6–23)
CO2: 28 meq/L (ref 19–32)
Calcium: 9.6 mg/dL (ref 8.4–10.5)
Chloride: 103 mEq/L (ref 96–112)
Creatinine, Ser: 0.62 mg/dL (ref 0.50–1.10)
GFR calc Af Amer: 88 mL/min — ABNORMAL LOW (ref >90)
GFR calc non Af Amer: 76 mL/min — ABNORMAL LOW (ref >90)
GLUCOSE: 127 mg/dL — AB (ref 70–99)
Potassium: 5 mEq/L (ref 3.7–5.3)
SODIUM: 142 meq/L (ref 137–147)

## 2014-01-25 LAB — GLUCOSE, CAPILLARY
GLUCOSE-CAPILLARY: 124 mg/dL — AB (ref 70–99)
GLUCOSE-CAPILLARY: 160 mg/dL — AB (ref 70–99)
Glucose-Capillary: 126 mg/dL — ABNORMAL HIGH (ref 70–99)
Glucose-Capillary: 141 mg/dL — ABNORMAL HIGH (ref 70–99)

## 2014-01-25 LAB — CBC
HCT: 33.8 % — ABNORMAL LOW (ref 36.0–46.0)
Hemoglobin: 11.3 g/dL — ABNORMAL LOW (ref 12.0–15.0)
MCH: 32.6 pg (ref 26.0–34.0)
MCHC: 33.4 g/dL (ref 30.0–36.0)
MCV: 97.4 fL (ref 78.0–100.0)
PLATELETS: 293 10*3/uL (ref 150–400)
RBC: 3.47 MIL/uL — AB (ref 3.87–5.11)
RDW: 13.4 % (ref 11.5–15.5)
WBC: 7.4 10*3/uL (ref 4.0–10.5)

## 2014-01-25 MED ORDER — LEVOTHYROXINE SODIUM 50 MCG PO TABS
50.0000 ug | ORAL_TABLET | Freq: Every day | ORAL | Status: DC
  Administered 2014-01-26 – 2014-01-31 (×6): 50 ug via ORAL
  Filled 2014-01-25 (×7): qty 1

## 2014-01-25 NOTE — Plan of Care (Signed)
Problem: Phase I Progression Outcomes Goal: Dyspnea controlled at rest Outcome: Progressing Goal: Pain controlled with appropriate interventions Outcome: Completed/Met Date Met:  01/25/14 Goal: OOB as tolerated unless otherwise ordered Outcome: Completed/Met Date Met:  01/25/14 Goal: First antibiotic given within 6hrs of admit Outcome: Completed/Met Date Met:  01/25/14 Goal: Confirm chest x-ray completed Outcome: Completed/Met Date Met:  01/25/14 Goal: Consider Infectious Disease Consult Outcome: Not Applicable Date Met:  32/99/24 Goal: Consider pulmonary consult Outcome: Not Applicable Date Met:  26/83/41 Goal: Code status addressed with pt/family Outcome: Completed/Met Date Met:  01/25/14 Goal: Initial discharge plan identified Outcome: Progressing Goal: Voiding-avoid urinary catheter unless indicated Outcome: Completed/Met Date Met:  01/25/14 Goal: Hemodynamically stable Outcome: Progressing

## 2014-01-25 NOTE — Evaluation (Signed)
Physical Therapy Evaluation Patient Details Name: Jodi Price MRN: [id number] DOB: 05-02-1919 Today's Date: 01/25/2014   History of Present Illness  80 yo female admitted HCAP. Discharged from [facility] with same one day prior. Hx of HTN and Afib.  Clinical Impression  Pt admitted with the above. Pt currently with functional limitations due to the deficits listed below (see PT Problem List). Pt fatigues rapidly with SpO2 dropping to 82% while on 3L supplemental O2 after ambulating only 30 feet. Up to 90% SpO2 while ambulating back to room with increase to 4L O2. May progress well enough to return home safely with HHPT depending on progression of medical status but feel at this time short term SNF may provide more appropriate care until she returns to prior level of function. Pt will benefit from skilled PT to increase their independence and safety with mobility to allow discharge to the venue listed below.       Follow Up Recommendations SNF    Equipment Recommendations  None recommended by PT    Recommendations for Other Services       Precautions / Restrictions Precautions Precautions: Fall Precaution Comments: monitor sats/O2 Restrictions Weight Bearing Restrictions: No      Mobility  Bed Mobility Overal bed mobility: Modified Independent             General bed mobility comments: pt up in chair  Transfers Overall transfer level: Needs assistance Equipment used: Rolling walker (2 wheeled) Transfers: Sit to/from Stand Sit to Stand: Supervision         General transfer comment: Supervision for safety. VC for hand placement.  Ambulation/Gait Ambulation/Gait assistance: Min guard Ambulation Distance (Feet): 60 Feet (1 extended standing rest break at 31feet) Assistive device: Rolling walker (2 wheeled) Gait Pattern/deviations: Step-through pattern;Decreased stride length;Trunk flexed   Gait velocity interpretation: Below normal speed for  age/gender General Gait Details: Slowly ambulates, requiring cues for upright posture. Pt became short of breath at 30 feet, SpO2 dropped from 94% to 82% while on 3L supplemental O2. 4L applied and with considerable standing restbreak elevated to 90% while returning to room. VC for pursed lip breathing technique while ambulating. no loss of balance with RW use.  Stairs            Wheelchair Mobility    Modified Rankin (Stroke Patients Only)       Balance Overall balance assessment: Needs assistance;History of Falls Sitting-balance support: No upper extremity supported;Feet supported Sitting balance-Leahy Scale: Good     Standing balance support: Bilateral upper extremity supported Standing balance-Leahy Scale: Poor                               Pertinent Vitals/Pain Pain Assessment: No/denies pain Pain Intervention(s): Monitored during session  SpO2 at rest 94% on 3L supplemental O2 SpO2 ambulating on 3L -82% SpO2 ambulating on 4L - 90% SpO2 at rest after ambulating 3L - 96%    Home Living Family/patient expects to be discharged to:: Private residence Living Arrangements: Alone Available Help at Discharge: Personal care attendant (12 hours/day) Type of Home: Independent living facility (senior apartment complex) Home Access: Level entry     Home Layout: One level Home Equipment: Walker - 4 wheels;Grab bars - toilet;Grab bars - tub/shower;Shower seat Additional Comments: was supposed to have 02 delivered at time of discharge from [facility], but was not delivered per pt.    Prior Function Level of Independence: Needs assistance  Gait / Transfers Assistance Needed: Uses rollator for ambulation at all times.  ADL's / Homemaking Assistance Needed: caregiver assists with meals, laundry and showering/dressing as needed.        Hand Dominance   Dominant Hand: Right    Extremity/Trunk Assessment   Upper Extremity Assessment: Overall WFL for tasks  assessed (arthritic changes in hands)           Lower Extremity Assessment: Defer to PT evaluation      Cervical / Trunk Assessment: Kyphotic  Communication   Communication: HOH  Cognition Arousal/Alertness: Awake/alert Behavior During Therapy: WFL for tasks assessed/performed Overall Cognitive Status: Within Functional Limits for tasks assessed                      General Comments General comments (skin integrity, edema, etc.): See vitals for SpO2    Exercises General Exercises - Lower Extremity Ankle Circles/Pumps: AROM;Both;10 reps;Seated Quad Sets: Strengthening;Both;10 reps;Seated Gluteal Sets: Strengthening;Both;10 reps;Seated Long Arc Quad: Strengthening;Both;10 reps;Seated      Assessment/Plan    PT Assessment Patient needs continued PT services  PT Diagnosis Abnormality of gait;Generalized weakness   PT Problem List Decreased strength;Decreased activity tolerance;Decreased balance;Decreased mobility;Decreased knowledge of use of DME;Decreased safety awareness;Cardiopulmonary status limiting activity  PT Treatment Interventions DME instruction;Gait training;Functional mobility training;Therapeutic activities;Therapeutic exercise;Balance training;Neuromuscular re-education;Patient/family education   PT Goals (Current goals can be found in the Care Plan section) Acute Rehab PT Goals Patient Stated Goal: home soon PT Goal Formulation: With patient Time For Goal Achievement: 02/08/14 Potential to Achieve Goals: Good    Frequency Min 2X/week   Barriers to discharge Decreased caregiver support Does not have 24 hour care at home    Co-evaluation               End of Session Equipment Utilized During Treatment: Gait belt;Oxygen Activity Tolerance: Patient limited by fatigue;Other (comment) (drop in Spo2) Patient left: in chair;with call bell/phone within reach;with chair alarm set Nurse Communication: Mobility status;Other (comment) (SpO2)          Time: 0867-6195 PT Time Calculation (min) (ACUTE ONLY): 44 min   Charges:   PT Evaluation $Initial PT Evaluation Tier I: 1 Procedure PT Treatments $Gait Training: 8-22 mins $Therapeutic Exercise: 8-22 mins   PT G Codes:          Jodi Price 01/25/2014, 3:59 PM  Jodi Price, [place]

## 2014-01-25 NOTE — Progress Notes (Signed)
ANTIBIOTIC CONSULT NOTE - FOLLOW UP  Pharmacy Consult for vancomycin and Zosyn Indication: HCAP with possible aspiration  Allergies  Allergen Reactions  . Influenza Vaccines Anaphylaxis  . Pneumococcal Vaccines Anaphylaxis    Patient Measurements: Height: 4\' 11"  (149.9 cm) Weight: 121 lb 14.4 oz (55.293 kg) (c scale) IBW/kg (Calculated) : 43.2  Vital Signs: Temp: 98.2 F (36.8 C) (11/24 0559) Temp Source: Oral (11/24 0559) BP: 134/70 mmHg (11/24 0559) Pulse Rate: 64 (11/24 0559) Intake/Output from previous day: 11/23 0701 - 11/24 0700 In: 1190 [P.O.:840; IV Piggyback:350] Out: 1325 [Urine:1325] Intake/Output from this shift: Total I/O In: 360 [P.O.:360] Out: -   Labs:  Recent Labs  01/22/14 2052 01/23/14 0427 01/25/14 0415  WBC 11.7* 11.2* 7.4  HGB 12.1 10.1* 11.3*  PLT 287 252 293  CREATININE 0.58 0.56 0.62   Estimated Creatinine Clearance: 34 mL/min (by C-G formula based on Cr of 0.62). No results for input(s): VANCOTROUGH, VANCOPEAK, VANCORANDOM, GENTTROUGH, GENTPEAK, GENTRANDOM, TOBRATROUGH, TOBRAPEAK, TOBRARND, AMIKACINPEAK, AMIKACINTROU, AMIKACIN in the last 72 hours.   Microbiology: Recent Results (from the past 720 hour(s))  Blood culture (routine x 2)     Status: None   Collection Time: 01/18/14  2:58 PM  Result Value Ref Range Status   Specimen Description BLOOD BLOOD RIGHT FOREARM  Final   Special Requests BOTTLES DRAWN AEROBIC AND ANAEROBIC 4 ML  Final   Culture  Setup Time   Final    01/18/2014 18:35 Performed at [facility]   Final    NO GROWTH 5 DAYS Performed at [facility]    Report Status 01/24/2014 FINAL  Final  Blood culture (routine x 2)     Status: None   Collection Time: 01/18/14  2:59 PM  Result Value Ref Range Status   Specimen Description BLOOD RIGHT WRIST  Final   Special Requests BOTTLES DRAWN AEROBIC AND ANAEROBIC 3 ML  Final   Culture  Setup Time   Final    01/18/2014 18:34 Performed at  [facility]   Final    NO GROWTH 5 DAYS Performed at [facility]    Report Status 01/24/2014 FINAL  Final  Blood culture (routine x 2)     Status: None (Preliminary result)   Collection Time: 01/22/14 10:16 PM  Result Value Ref Range Status   Specimen Description BLOOD RIGHT FOREARM  Final   Special Requests BOTTLES DRAWN AEROBIC AND ANAEROBIC 10CC EA  Final   Culture  Setup Time   Final    01/23/2014 12:25 Performed at [facility]    Culture   Final           BLOOD CULTURE RECEIVED NO GROWTH TO DATE CULTURE WILL BE HELD FOR 5 DAYS BEFORE ISSUING A FINAL NEGATIVE REPORT Performed at [facility]    Report Status PENDING  Incomplete  Blood culture (routine x 2)     Status: None (Preliminary result)   Collection Time: 01/22/14 10:17 PM  Result Value Ref Range Status   Specimen Description BLOOD LEFT FOREARM  Final   Special Requests BOTTLES DRAWN AEROBIC AND ANAEROBIC 5.5CC EA  Final   Culture  Setup Time   Final    01/23/2014 11:51 Performed at [facility]    Culture   Final           BLOOD CULTURE RECEIVED NO GROWTH TO DATE CULTURE WILL BE HELD FOR 5 DAYS BEFORE ISSUING A  FINAL NEGATIVE REPORT Performed at [facility]    Report Status PENDING  Incomplete    Anti-infectives    Start     Dose/Rate Route Frequency Ordered Stop   01/23/14 2200  vancomycin (VANCOCIN) IVPB 750 mg/150 ml premix     750 mg150 mL/hr over 60 Minutes Intravenous Every 24 hours 01/22/14 2344     01/23/14 0400  piperacillin-tazobactam (ZOSYN) IVPB 3.375 g     3.375 g12.5 mL/hr over 240 Minutes Intravenous Every 8 hours 01/22/14 2344     01/22/14 2345  azithromycin (ZITHROMAX) 500 mg in dextrose 5 % 250 mL IVPB  Status:  Discontinued     500 mg250 mL/hr over 60 Minutes Intravenous Every 24 hours 01/22/14 2332 01/24/14 1232   01/22/14 2200  piperacillin-tazobactam (ZOSYN) IVPB 3.375 g     3.375 g100 mL/hr over 30 Minutes Intravenous  Once  01/22/14 2145 01/22/14 2303   01/22/14 2200  vancomycin (VANCOCIN) IVPB 1000 mg/200 mL premix     1,000 mg200 mL/hr over 60 Minutes Intravenous  Once 01/22/14 2145 01/22/14 2337      Assessment: 80 y/o female on day 4 vancomycin and Zosyn for HCAP with possible aspiration. WBC are normal, she is afebrile, and renal function is stable. Culture data is negative thus far.   Goal of Therapy:  Vancomycin trough level 15-20 mcg/ml  Plan:  - Continue vancomycin 750 mg IV q24h - Continue Zosyn 3.375 g IV q8h to be infused over 4 hours - Monitor renal function, clinical progress, and culture data - Patient is taking Lopressor 50 mg daily - this is not an ER formulation, consider changing to Lopressor 25 mg BID for 24 hr coverage - K is 5 today - consider holding KCl for now  [facility], [place].D., BCPS Clinical Pharmacist Pager: [phone] 01/25/2014 1:43 PM

## 2014-01-25 NOTE — Clinical Social Work Psychosocial (Addendum)
    Clinical Social Work Department BRIEF PSYCHOSOCIAL ASSESSMENT 01/25/2014  Patient:  Jodi Price, Jodi Price     Account Number:  [id number]     Admit date:  01/22/2014  Clinical Social Worker:  Jodi Price, CLINICAL SOCIAL WORKER  Date/Time:  01/25/2014 11:36 AM  Referred by:  Physician  Date Referred:  01/25/2014 Referred for  Psychosocial assessment   Other Referral:   Interview type:  Patient Other interview type:   SW talked with Patient at bedside.    PSYCHOSOCIAL DATA Living Status:  FACILITY Admitted from facility:  Other Level of care:  Independent Living Primary support name:  Jodi Price,Jodi Price Primary support relationship to patient:  CHILD, ADULT Degree of support available:   [phone], Jodi Price is POA and is very supportive in decision making for her mother.    CURRENT CONCERNS Current Concerns  Post-Acute Placement   Other Concerns:    SOCIAL WORK ASSESSMENT / PLAN The pt does not want to go to SNF. She wants to return home to her Ind Living/Senior apt. She states that she has life alert and has aids that come help her 3-4 hours daily. She also has home oxygen. She is also interested in getting some home health pt. SW will contact daughter for further recommendation.   Assessment/plan status:  Psychosocial Support/Ongoing Assessment of Needs Other assessment/ plan:   Information/referral to community resources:    PATIENT'S/FAMILY'S RESPONSE TO PLAN OF CARE: Pt states that her daughter is very supportive. Daughter is POA and helps with decision making. She was very pleasant and alert. Jodi Price, BSW Intern   01/25/14  I have read and reviewed BSW Interns note and approve it's contents.  CSW notified RNCM of daughter's request for a nebulizer and home 02.  Note placed in Epic for MD.  [place] signing off.  Jodi Price. Jodi Price, [place]

## 2014-01-25 NOTE — Progress Notes (Signed)
[facility] PROGRESS NOTE  Jodi Price SJG:[id number] DOB: 1919/11/16 DOA: 01/22/2014 PCP: Adline Mango, MD   Brief Narrative: 80 yo female with dm2, htn, afib, dementia, just discharged from [facility] 11/20 after admission for pneumonia apparently was discharged and c/o increasing sob,  Pt was found to have pox 80% by EMS, and brought to ED for evaluation. CXR => increase in bilateral infiltrates. Right >left. Pt was febrile and with elevation in wbc 11.7 in ED. Since admission, improving on Abx, speech veal completed O2 being weaned  Assessment/Plan: 1. HCAP -continue Vanc/Zosyn Day 2, change to Po levaquin -FU Blood Cx-NGTD, stop IVF -Swallow eval completed, on D3 diet, for MBS completed -mucinex -wean O2  2. N/V -transient, resolved -resumed diet  -KUB with constipation -laxatives PRN  3. DM2 -stable, SSI  4. Hypertension: -stable, continue amlodipine  5. Hypothyroidism -Cont levothyroxine  6. chronic atrial fibrillation -continue Lopressor for rate control and aspirin  Ambulate, Pt/OT consulted, eval pending  Code Status: Full Code Family Communication: none at bedside Disposition Plan: home vs SNF  HPI/Subjective: Breathing better, dyspnea improving  Objective: Filed Vitals:   01/25/14 1536  BP: 118/54  Pulse: 68  Temp: 99 F (37.2 C)  Resp: 16    Intake/Output Summary (Last 24 hours) at 01/25/14 1816 Last data filed at 01/25/14 1803  Gross per 24 hour  Intake   1070 ml  Output    975 ml  Net     95 ml   Filed Weights   01/23/14 0659 01/24/14 0544 01/25/14 0341  Weight: 58.7 kg (129 lb 6.6 oz) 56.79 kg (125 lb 3.2 oz) 55.293 kg (121 lb 14.4 oz)    Exam:   General:  AAOx3, frail elderly female, no distress  Cardiovascular: S1S2/RRR  Respiratory: basilar crackles  Abdomen: soft, NT, BS present  Musculoskeletal: no edema c/c   Data Reviewed: Basic Metabolic Panel:  Recent Labs Lab 01/20/14 0454 01/21/14 0530 01/22/14 2052  01/23/14 0427 01/25/14 0415  NA 139 136* 137 134* 142  K 5.2 3.8 4.0 3.9 5.0  CL 102 98 97 95* 103  CO2 27 25 25 23 28   GLUCOSE 148* 150* 215* 183* 127*  BUN 14 12 11 11 7   CREATININE 0.80 0.60 0.58 0.56 0.62  CALCIUM 9.3 9.2 9.4 8.5 9.6   Liver Function Tests:  Recent Labs Lab 01/23/14 0427  AST 19  ALT 8  ALKPHOS 46  BILITOT 0.6  PROT 6.3  ALBUMIN 2.1*   No results for input(s): LIPASE, AMYLASE in the last 168 hours. No results for input(s): AMMONIA in the last 168 hours. CBC:  Recent Labs Lab 01/19/14 0850 01/20/14 0454 01/21/14 0530 01/22/14 2052 01/23/14 0427 01/25/14 0415  WBC 6.0 6.7 6.2 11.7* 11.2* 7.4  NEUTROABS 3.5  --   --   --  9.8*  --   HGB 11.7* 11.1* 10.7* 12.1 10.1* 11.3*  HCT 34.1* 33.9* 32.9* 36.8 30.7* 33.8*  MCV 93.9 96.6 96.8 95.8 94.8 97.4  PLT 259 281 261 287 252 293   Cardiac Enzymes: No results for input(s): CKTOTAL, CKMB, CKMBINDEX, TROPONINI in the last 168 hours. BNP (last 3 results)  Recent Labs  01/18/14 1247 01/19/14 0850 01/22/14 2052  PROBNP 917.6* 889.5* 1026.0*   CBG:  Recent Labs Lab 01/24/14 1646 01/24/14 2012 01/25/14 0558 01/25/14 1129 01/25/14 1629  GLUCAP 146* 139* 126* 124* 141*    Recent Results (from the past 240 hour(s))  Blood culture (routine x 2)  Status: None   Collection Time: 01/18/14  2:58 PM  Result Value Ref Range Status   Specimen Description BLOOD BLOOD RIGHT FOREARM  Final   Special Requests BOTTLES DRAWN AEROBIC AND ANAEROBIC 4 ML  Final   Culture  Setup Time   Final    01/18/2014 18:35 Performed at [facility]   Final    NO GROWTH 5 DAYS Performed at [facility]    Report Status 01/24/2014 FINAL  Final  Blood culture (routine x 2)     Status: None   Collection Time: 01/18/14  2:59 PM  Result Value Ref Range Status   Specimen Description BLOOD RIGHT WRIST  Final   Special Requests BOTTLES DRAWN AEROBIC AND ANAEROBIC 3 ML  Final   Culture   Setup Time   Final    01/18/2014 18:34 Performed at [facility]   Final    NO GROWTH 5 DAYS Performed at [facility]    Report Status 01/24/2014 FINAL  Final  Blood culture (routine x 2)     Status: None (Preliminary result)   Collection Time: 01/22/14 10:16 PM  Result Value Ref Range Status   Specimen Description BLOOD RIGHT FOREARM  Final   Special Requests BOTTLES DRAWN AEROBIC AND ANAEROBIC 10CC EA  Final   Culture  Setup Time   Final    01/23/2014 12:25 Performed at [facility]    Culture   Final           BLOOD CULTURE RECEIVED NO GROWTH TO DATE CULTURE WILL BE HELD FOR 5 DAYS BEFORE ISSUING A FINAL NEGATIVE REPORT Performed at [facility]    Report Status PENDING  Incomplete  Blood culture (routine x 2)     Status: None (Preliminary result)   Collection Time: 01/22/14 10:17 PM  Result Value Ref Range Status   Specimen Description BLOOD LEFT FOREARM  Final   Special Requests BOTTLES DRAWN AEROBIC AND ANAEROBIC 5.5CC EA  Final   Culture  Setup Time   Final    01/23/2014 11:51 Performed at [facility]    Culture   Final           BLOOD CULTURE RECEIVED NO GROWTH TO DATE CULTURE WILL BE HELD FOR 5 DAYS BEFORE ISSUING A FINAL NEGATIVE REPORT Performed at [facility]    Report Status PENDING  Incomplete     Studies: No results found.  Scheduled Meds: . amLODipine  5 mg Oral Daily  . aspirin  81 mg Oral Daily  . dextromethorphan-guaiFENesin  1 tablet Oral BID  . enoxaparin (LOVENOX) injection  40 mg Subcutaneous Q24H  . FLUoxetine  20 mg Oral Daily  . insulin aspart  0-9 Units Subcutaneous TID WC  . [START ON 01/26/2014] levothyroxine  50 mcg Oral QAC breakfast  . metoprolol  50 mg Oral Daily  . pantoprazole  40 mg Oral Daily  . piperacillin-tazobactam (ZOSYN)  IV  3.375 g Intravenous Q8H  . polyethylene glycol  17 g Oral BID  . potassium chloride  10 mEq Oral Daily  . vancomycin  750 mg  Intravenous Q24H   Continuous Infusions:   Antibiotics Given (last 72 hours)    Date/Time Action Medication Dose Rate   01/23/14 0039 Given   azithromycin (ZITHROMAX) 500 mg in dextrose 5 % 250 mL IVPB 500 mg 250 mL/hr   01/23/14 0444 Given   piperacillin-tazobactam (ZOSYN) IVPB 3.375 g 3.375  g 12.5 mL/hr   01/23/14 1223 Given   piperacillin-tazobactam (ZOSYN) IVPB 3.375 g 3.375 g 12.5 mL/hr   01/23/14 2007 Given   piperacillin-tazobactam (ZOSYN) IVPB 3.375 g 3.375 g 12.5 mL/hr   01/23/14 2109 Given   vancomycin (VANCOCIN) IVPB 750 mg/150 ml premix 750 mg 150 mL/hr   01/24/14 0124 Given   azithromycin (ZITHROMAX) 500 mg in dextrose 5 % 250 mL IVPB 500 mg 250 mL/hr   01/24/14 0456 Given   piperacillin-tazobactam (ZOSYN) IVPB 3.375 g 3.375 g 12.5 mL/hr   01/24/14 1114 Given   piperacillin-tazobactam (ZOSYN) IVPB 3.375 g 3.375 g 12.5 mL/hr   01/24/14 2221 Given   piperacillin-tazobactam (ZOSYN) IVPB 3.375 g 3.375 g 12.5 mL/hr   01/24/14 2228 Given   vancomycin (VANCOCIN) IVPB 750 mg/150 ml premix 750 mg 150 mL/hr   01/25/14 0428 Given   piperacillin-tazobactam (ZOSYN) IVPB 3.375 g 3.375 g 12.5 mL/hr   01/25/14 1323 Given   piperacillin-tazobactam (ZOSYN) IVPB 3.375 g 3.375 g 12.5 mL/hr      Active Problems:   DM (diabetes mellitus)   HTN (hypertension)   Pneumonia   Healthcare-associated pneumonia   Shortness of breath    Time spent: 66min    [facility] Pager [phone]. If 7PM-7AM, please contact night-coverage at [email or web address], password [facility] 01/25/2014, 6:16 PM  LOS: 3 days

## 2014-01-25 NOTE — Progress Notes (Addendum)
SW talked with daughter via telephone. Daughter wants her mother to return home to her independent senior living center. Pt and daughter would like home health and physical therapy set up at home.  [employer], [place] Intern, [phone]

## 2014-01-25 NOTE — Evaluation (Signed)
Occupational Therapy Evaluation Patient Details Name: Jodi Price MRN: [id number] DOB: 26-Nov-1919 Today's Date: 01/25/2014    History of Present Illness 80 yo female admitted HCAP. Discharged from [facility] with same one day prior. Hx of HTN and Afib.   Clinical Impression   Pt was performing self care and mobility with supervision and DME prior to admission.  Pt presents with decreased activity tolerance due to above.  Pt continues to require supervision and has availability of assistance at home.  No further OT needs. Pt is hopeful to discharge home tomorrow.    Follow Up Recommendations  No OT follow up;Supervision - Intermittent    Equipment Recommendations  None recommended by OT    Recommendations for Other Services       Precautions / Restrictions Precautions Precautions: Fall Precaution Comments: monitor sats/O2 Restrictions Weight Bearing Restrictions: No      Mobility Bed Mobility               General bed mobility comments: pt up in chair  Transfers Overall transfer level: Needs assistance Equipment used: Rolling walker (2 wheeled) Transfers: Sit to/from Stand Sit to Stand: Supervision              Balance                                            ADL Overall ADL's : Needs assistance/impaired Eating/Feeding: Independent;Sitting   Grooming: Wash/dry hands;Standing;Supervision/safety   Upper Body Bathing: Set up;Sitting   Lower Body Bathing: Supervison/ safety;Sit to/from stand   Upper Body Dressing : Set up;Sitting   Lower Body Dressing: Supervision/safety;Sit to/from stand   Toilet Transfer: Supervision/safety;Ambulation;Comfort height toilet;Grab bars   Toileting- Clothing Manipulation and Hygiene: Supervision/safety;Sit to/from stand       Functional mobility during ADLs: Supervision/safety;Rolling walker General ADL Comments: Assistance needed to manage lines.      Vision                      Perception     Praxis      Pertinent Vitals/Pain Pain Assessment: No/denies pain Pain Intervention(s): Monitored during session     Hand Dominance Right   Extremity/Trunk Assessment Upper Extremity Assessment Upper Extremity Assessment: Overall WFL for tasks assessed (arthritic changes in hands)   Lower Extremity Assessment Lower Extremity Assessment: Defer to PT evaluation   Cervical / Trunk Assessment Cervical / Trunk Assessment: Kyphotic   Communication Communication Communication: HOH   Cognition Arousal/Alertness: Awake/alert Behavior During Therapy: WFL for tasks assessed/performed Overall Cognitive Status: Within Functional Limits for tasks assessed                     General Comments       Exercises       Shoulder Instructions      Home Living Family/patient expects to be discharged to:: Private residence Living Arrangements: Alone Available Help at Discharge: Personal care attendant (12 hours/day) Type of Home: Independent living facility (senior apartment complex) Home Access: Level entry     Home Layout: One level     Bathroom Shower/Tub: Hospital doctor Toilet: Handicapped height     Home Equipment: Environmental consultant - 4 wheels;Grab bars - toilet;Grab bars - tub/shower;Shower seat   Additional Comments: was supposed to have 02 delivered at time of discharge from [facility], but was not  delivered per pt.      Prior Functioning/Environment Level of Independence: Needs assistance  Gait / Transfers Assistance Needed: Uses rollator for ambulation at all times. ADL's / Homemaking Assistance Needed: caregiver assists with meals, laundry and showering/dressing as needed.        OT Diagnosis:     OT Problem List:     OT Treatment/Interventions:      OT Goals(Current goals can be found in the care plan section) Acute Rehab OT Goals Patient Stated Goal: home soon  OT Frequency:     Barriers to D/C:             Co-evaluation              End of Session Equipment Utilized During Treatment: Rolling walker;Oxygen  Activity Tolerance: Patient limited by fatigue Patient left: in chair;with call bell/phone within reach;with chair alarm set   Time: 1448-1520 OT Time Calculation (min): 32 min Charges:  OT General Charges $OT Visit: 1 Procedure OT Evaluation $Initial OT Evaluation Tier I: 1 Procedure OT Treatments $Self Care/Home Management : 8-22 mins G-Codes:    Malka So 01/25/2014, 3:49 PM  [phone]

## 2014-01-25 NOTE — Progress Notes (Signed)
Speech Language Pathology Treatment: Dysphagia  Patient Details Name: Jodi Price MRN: [id number] DOB: [birth year] Today's Date: 01/25/2014 Time: 0810-0840 SLP Time Calculation (min) (ACUTE ONLY): 30 min  Assessment / Plan / Recommendation Clinical Impression  Pt assisted to bedside commode, SLP provided supervision assist with am meal. Pt independently following small sip precaution, but SLP verbally reinforced precautions and explained rationale. Discussed carry over to home setting. No evidence of aspiration seen at bedside. Pt verbalizes understanding. All education complete, no f/u needed at this time, will sign off.    HPI HPI: 80 yo female with dm2, htn, afib, dementia,GERD, hiatal hernia, recent admission for pneumonia apparently was discharged and c/o increasing sob today. Pt was found to have pox 80% by EMS, and brought to ED for evaluation. CXR => increase in bilateral infiltrates. Right >left. Swallow eval ordered to r/o dysphagia in light of recurrent PNA.    Pertinent Vitals Pain Assessment: No/denies pain  SLP Plan  All goals met;Discharge SLP treatment due to (comment)    Recommendations Diet recommendations: Thin liquid;Dysphagia 3 (mechanical soft) Liquids provided via: Cup;No straw Medication Administration: Whole meds with liquid (one at a time) Supervision: Patient able to self feed;Intermittent supervision to cue for compensatory strategies Compensations: Slow rate;Small sips/bites Postural Changes and/or Swallow Maneuvers: Seated upright 90 degrees;Upright 30-60 min after meal              Oral Care Recommendations: Oral care BID Follow up Recommendations: None Plan: All goals met;Discharge SLP treatment due to (comment)    GO    Herbie Baltimore, MA CCC-SLP [phone]  Lynann Beaver 01/25/2014, 9:22 AM

## 2014-01-26 LAB — GLUCOSE, CAPILLARY
GLUCOSE-CAPILLARY: 132 mg/dL — AB (ref 70–99)
GLUCOSE-CAPILLARY: 115 mg/dL — AB (ref 70–99)
Glucose-Capillary: 134 mg/dL — ABNORMAL HIGH (ref 70–99)
Glucose-Capillary: 140 mg/dL — ABNORMAL HIGH (ref 70–99)

## 2014-01-26 MED ORDER — ASPIRIN 81 MG PO CHEW
81.0000 mg | CHEWABLE_TABLET | Freq: Every day | ORAL | Status: DC
  Administered 2014-01-26 – 2014-01-31 (×6): 81 mg via ORAL
  Filled 2014-01-26 (×5): qty 1

## 2014-01-26 MED ORDER — ONDANSETRON HCL 4 MG/2ML IJ SOLN
4.0000 mg | Freq: Four times a day (QID) | INTRAMUSCULAR | Status: DC | PRN
  Administered 2014-01-26: 4 mg via INTRAVENOUS
  Filled 2014-01-26: qty 2

## 2014-01-26 MED ORDER — METOPROLOL TARTRATE 50 MG PO TABS
50.0000 mg | ORAL_TABLET | Freq: Two times a day (BID) | ORAL | Status: DC
  Administered 2014-01-26 – 2014-01-31 (×9): 50 mg via ORAL
  Filled 2014-01-26 (×11): qty 1

## 2014-01-26 NOTE — Care Management Note (Addendum)
    Page 1 of 2   01/29/2014     5:19:45 PM CARE MANAGEMENT NOTE 01/29/2014  Patient:  RAYLYN, CARTON   Account Number:  [id number]  Date Initiated:  01/26/2014  Documentation initiated by:  HUTCHINSON,CRYSTAL  Subjective/Objective Assessment:   N&V,PNA     Action/Plan:   CM to follow for disposition needs   Anticipated DC Date:  01/29/2014   Anticipated DC Plan:  [place]  In-house referral  NA      DC Planning Services  CM consult      [facility] Choice  [place]   Choice offered to / List presented to:  C-4 Adult Children   DME arranged  NEBULIZER MACHINE      DME agency  [place] arranged  HH-1 RN  HH-10 DISEASE MANAGEMENT  HH-2 PT  HH-3 OT  [place]      [place].   Status of service:  Completed, signed off Medicare Important Message given?  YES (If response is "NO", the following Medicare IM given date fields will be blank) Date Medicare IM given:  01/26/2014 Medicare IM given by:  HUTCHINSON,CRYSTAL Date Additional Medicare IM given:  01/28/2014 Additional Medicare IM given by:  Ellan Lambert  Discharge Disposition:  [place]  Per UR Regulation:  Reviewed for med. necessity/level of care/duration of stay  If discussed at [place] of Stay Meetings, dates discussed:   01/27/2014    Comments:  01/29/14 17:15 CM discussed with RN pt's family may wish to explore SNF options.  CM left message with CSW and will follow tomorrow 01/30/14.  Mariane Masters, BSN, [place].  Crystal Hutchinson RN, BSN, MSHL, CCM  Nurse - Case Manager,  (Unit Dean)  [phone]  01/26/2014 [facility] / [place]) Update from Dr. Wynelle Cleveland at 12:15pm - states patient is clear and no needs for home nebulizer.  Order cancelled with [facility].

## 2014-01-26 NOTE — Progress Notes (Addendum)
[facility] PROGRESS NOTE  Jodi Price YKZ:[id number] DOB: [birth year] DOA: 01/22/2014 PCP: Adline Mango, MD   Brief Narrative: 80 yo female with dm2, htn, afib, dementia, just discharged from [facility] 11/20 after admission for pneumonia-  returned the following day with a complaint of shortness of breath.  Pt was found to have pox 80% by EMS, and brought to ED for evaluation. CXR revealed increase in bilateral infiltrates.  She was noted to have a fever of 102.6 the ER and WBC count was mildly elevated at 11.2. While in the hospital previously, the patient was receiving IV Rocephin and azithromycin and was changed to Levaquin but was noted to have a complaint of nausea and therefore Levaquin was changed to Augmentin. It was planned to continue Augmentin and azithromycin to complete 1 week.   Assessment/Plan: 1. HCAP-with increasing pulmonary infiltrates -Started on vancomycin, azithromycin and Zosyn 11/21 on admission-it appears that Azithromycin was discontinued on 11/23 -Continue Vanco and Zosyn-symptoms continued to improve- repeat chest x-ray tomorrow morning-continues to have crackles at bilateral bases -Blood cultures remain negative -Swallow eval completed, on D3 diet -Continue to wean O2 as able-she was previously discharged on 2 L of oxygen  2. Nausea vomiting and constipation -transient -resumed diet but appetite is poor -KUB reveals a moderate stool burden-she had a BM today -laxatives PRN  3. DM2 -stable, SSI  4. Hypertension: -stable, continue amlodipine  5. Hypothyroidism -Cont levothyroxine  6. chronic atrial fibrillation -continue Lopressor for rate control and aspirin  Ambulate, Pt/OT consulted, eval pending  Code Status: Full Code Family Communication: none at bedside Disposition Plan: home vs SNF  HPI/Subjective: She has no complaints today but appetite remains poor. She told the nurse that she was having some nausea.  Objective: Filed Vitals:   01/26/14 1424  BP: 99/52  Pulse: 62  Temp: 97.7 F (36.5 C)  Resp: 18    Intake/Output Summary (Last 24 hours) at 01/26/14 1645 Last data filed at 01/26/14 1621  Gross per 24 hour  Intake    660 ml  Output   1600 ml  Net   -940 ml   Filed Weights   01/24/14 0544 01/25/14 0341 01/26/14 0517  Weight: 56.79 kg (125 lb 3.2 oz) 55.293 kg (121 lb 14.4 oz) 54.84 kg (120 lb 14.4 oz)    Exam:   General:  AAOx3, frail elderly female, no distress  Cardiovascular: S1S2/RRR  Respiratory: basilar crackles  Abdomen: soft, NT, BS present  Musculoskeletal: no edema c/c   Data Reviewed: Basic Metabolic Panel:  Recent Labs Lab 01/20/14 0454 01/21/14 0530 01/22/14 2052 01/23/14 0427 01/25/14 0415  NA 139 136* 137 134* 142  K 5.2 3.8 4.0 3.9 5.0  CL 102 98 97 95* 103  CO2 27 25 25 23 28   GLUCOSE 148* 150* 215* 183* 127*  BUN 14 12 11 11 7   CREATININE 0.80 0.60 0.58 0.56 0.62  CALCIUM 9.3 9.2 9.4 8.5 9.6   Liver Function Tests:  Recent Labs Lab 01/23/14 0427  AST 19  ALT 8  ALKPHOS 46  BILITOT 0.6  PROT 6.3  ALBUMIN 2.1*   No results for input(s): LIPASE, AMYLASE in the last 168 hours. No results for input(s): AMMONIA in the last 168 hours. CBC:  Recent Labs Lab 01/20/14 0454 01/21/14 0530 01/22/14 2052 01/23/14 0427 01/25/14 0415  WBC 6.7 6.2 11.7* 11.2* 7.4  NEUTROABS  --   --   --  9.8*  --   HGB 11.1* 10.7* 12.1 10.1* 11.3*  HCT 33.9* 32.9* 36.8 30.7* 33.8*  MCV 96.6 96.8 95.8 94.8 97.4  PLT 281 261 287 252 293   Cardiac Enzymes: No results for input(s): CKTOTAL, CKMB, CKMBINDEX, TROPONINI in the last 168 hours. BNP (last 3 results)  Recent Labs  01/18/14 1247 01/19/14 0850 01/22/14 2052  PROBNP 917.6* 889.5* 1026.0*   CBG:  Recent Labs Lab 01/25/14 1629 01/25/14 2117 01/26/14 0606 01/26/14 1112 01/26/14 1602  GLUCAP 141* 160* 134* 140* 132*    Recent Results (from the past 240 hour(s))  Blood culture (routine x 2)     Status:  None   Collection Time: 01/18/14  2:58 PM  Result Value Ref Range Status   Specimen Description BLOOD BLOOD RIGHT FOREARM  Final   Special Requests BOTTLES DRAWN AEROBIC AND ANAEROBIC 4 ML  Final   Culture  Setup Time   Final    01/18/2014 18:35 Performed at [place]   Final    NO GROWTH 5 DAYS Performed at [facility]    Report Status 01/24/2014 FINAL  Final  Blood culture (routine x 2)     Status: None   Collection Time: 01/18/14  2:59 PM  Result Value Ref Range Status   Specimen Description BLOOD RIGHT WRIST  Final   Special Requests BOTTLES DRAWN AEROBIC AND ANAEROBIC 3 ML  Final   Culture  Setup Time   Final    01/18/2014 18:34 Performed at [place]   Final    NO GROWTH 5 DAYS Performed at [facility]    Report Status 01/24/2014 FINAL  Final  Blood culture (routine x 2)     Status: None (Preliminary result)   Collection Time: 01/22/14 10:16 PM  Result Value Ref Range Status   Specimen Description BLOOD RIGHT FOREARM  Final   Special Requests BOTTLES DRAWN AEROBIC AND ANAEROBIC 10CC EA  Final   Culture  Setup Time   Final    01/23/2014 12:25 Performed at [facility]    Culture   Final           BLOOD CULTURE RECEIVED NO GROWTH TO DATE CULTURE WILL BE HELD FOR 5 DAYS BEFORE ISSUING A FINAL NEGATIVE REPORT Performed at [facility]    Report Status PENDING  Incomplete  Blood culture (routine x 2)     Status: None (Preliminary result)   Collection Time: 01/22/14 10:17 PM  Result Value Ref Range Status   Specimen Description BLOOD LEFT FOREARM  Final   Special Requests BOTTLES DRAWN AEROBIC AND ANAEROBIC 5.5CC EA  Final   Culture  Setup Time   Final    01/23/2014 11:51 Performed at [facility]    Culture   Final           BLOOD CULTURE RECEIVED NO GROWTH TO DATE CULTURE WILL BE HELD FOR 5 DAYS BEFORE ISSUING A FINAL NEGATIVE REPORT Performed at [facility]     Report Status PENDING  Incomplete     Studies: No results found.  Scheduled Meds: . amLODipine  5 mg Oral Daily  . aspirin  81 mg Oral Daily  . dextromethorphan-guaiFENesin  1 tablet Oral BID  . enoxaparin (LOVENOX) injection  40 mg Subcutaneous Q24H  . FLUoxetine  20 mg Oral Daily  . insulin aspart  0-9 Units Subcutaneous TID WC  . levothyroxine  50 mcg Oral QAC breakfast  . metoprolol  50 mg Oral Daily  .  pantoprazole  40 mg Oral Daily  . piperacillin-tazobactam (ZOSYN)  IV  3.375 g Intravenous Q8H  . polyethylene glycol  17 g Oral BID  . potassium chloride  10 mEq Oral Daily  . vancomycin  750 mg Intravenous Q24H   Continuous Infusions:   Antibiotics Given (last 72 hours)    Date/Time Action Medication Dose Rate   01/23/14 2007 Given   piperacillin-tazobactam (ZOSYN) IVPB 3.375 g 3.375 g 12.5 mL/hr   01/23/14 2109 Given   vancomycin (VANCOCIN) IVPB 750 mg/150 ml premix 750 mg 150 mL/hr   01/24/14 0124 Given   azithromycin (ZITHROMAX) 500 mg in dextrose 5 % 250 mL IVPB 500 mg 250 mL/hr   01/24/14 0456 Given   piperacillin-tazobactam (ZOSYN) IVPB 3.375 g 3.375 g 12.5 mL/hr   01/24/14 1114 Given   piperacillin-tazobactam (ZOSYN) IVPB 3.375 g 3.375 g 12.5 mL/hr   01/24/14 2221 Given   piperacillin-tazobactam (ZOSYN) IVPB 3.375 g 3.375 g 12.5 mL/hr   01/24/14 2228 Given   vancomycin (VANCOCIN) IVPB 750 mg/150 ml premix 750 mg 150 mL/hr   01/25/14 0428 Given   piperacillin-tazobactam (ZOSYN) IVPB 3.375 g 3.375 g 12.5 mL/hr   01/25/14 1323 Given   piperacillin-tazobactam (ZOSYN) IVPB 3.375 g 3.375 g 12.5 mL/hr   01/25/14 1959 Given   piperacillin-tazobactam (ZOSYN) IVPB 3.375 g 3.375 g 12.5 mL/hr   01/25/14 2124 Given   vancomycin (VANCOCIN) IVPB 750 mg/150 ml premix 750 mg 150 mL/hr   01/26/14 0418 Given   piperacillin-tazobactam (ZOSYN) IVPB 3.375 g 3.375 g 12.5 mL/hr   01/26/14 1238 Given   piperacillin-tazobactam (ZOSYN) IVPB 3.375 g 3.375 g 12.5 mL/hr      Time  spent: 27min    Katlynne Mckercher, MD  [facility] Pager [phone]. If 7PM-7AM, please contact night-coverage at [email or web address], password [facility] 01/26/2014, 4:45 PM  LOS: 4 days

## 2014-01-26 NOTE — Clinical Documentation Improvement (Signed)
Per ED notes pt was satting in the med 80 on her home O2 and satted at 90% w/ 15L NRB with EMS.   Currently, pt on O2 @ 4L/[place] in setting of PNA  Please clarify the underlying diagnosis other than the PNA responsible for the treatment of oxygen (if known) and document in pn or d/c summary   Possible Clinical Conditions?  Acute Respiratory Failure Acute on Chronic Respiratory Failure Chronic Respiratory Failure Acute Respiratory Insufficiency Acute Respiratory Insufficiency following surgery or trauma Pulmonary Fibrosis Other Condition Cannot Clinically Determine   Supporting Information: Risk Factors: Signs & Symptoms: Patient initially satting in the mid 80s on her home O2, satting high 90s with 15L NRB with EMS. On exam after getting hooked up to the monitor, comfortable on 3 L [place], relaxing in the bed, will turn O2 back to home level of 2 L and reassess.per ED  Diagnostics: Chest Xray 01/22/14: IMPRESSION: Increased interstitial prominence with new right-greater-than-left basilar airspace opacities compared with recent prior studies. Findings suggest edema or pneumonia superimposed on pulmonary fibrosis.   Thank You, Heloise Beecham ,RN Clinical Documentation Specialist:  [facility] [employer] [facility]

## 2014-01-26 NOTE — Progress Notes (Signed)
Physical Therapy recommends SNF. Patient and daughter decline SNF recommendation and plan to return home to independent arrangement. Has home 02 in place; RNCM is aware and will follow up with d/c needs. Jodi Price. [name], [name]

## 2014-01-27 LAB — GLUCOSE, CAPILLARY
Glucose-Capillary: 133 mg/dL — ABNORMAL HIGH (ref 70–99)
Glucose-Capillary: 176 mg/dL — ABNORMAL HIGH (ref 70–99)
Glucose-Capillary: 132 mg/dL — ABNORMAL HIGH (ref 70–99)
Glucose-Capillary: 117 mg/dL — ABNORMAL HIGH (ref 70–99)

## 2014-01-27 NOTE — Progress Notes (Signed)
[facility] PROGRESS NOTE  Jodi HALTIWANGER Price:[id number] DOB: 30-Jan-1920 DOA: 01/22/2014 PCP: Adline Mango, MD   Brief Narrative: 80 yo female with dm2, htn, afib, dementia, just discharged from [facility] 11/20 after admission for pneumonia-  returned the following day with a complaint of shortness of breath.  Pt was found to have pox 80% by EMS, and brought to ED for evaluation. CXR revealed increase in bilateral infiltrates.  She was noted to have a fever of 102.6 the ER and WBC count was mildly elevated at 11.2. While in the hospital previously, the patient was receiving IV Rocephin and azithromycin and was changed to Levaquin but was noted to have a complaint of nausea and therefore Levaquin was changed to Augmentin. It was planned to continue Augmentin and azithromycin to complete 1 week.   Assessment/Plan: 1. HCAP-with increasing pulmonary infiltrates -Started on vancomycin, azithromycin and Zosyn 11/21 on admission-it appears that Azithromycin was discontinued on 11/23 -Continue Vanco and Zosyn-symptoms continued to improve- repeat chest x-ray tomorrow morning-continues to have crackles at bilateral bases -Blood cultures remain negative -Swallow eval completed, on D3 diet -Continue to wean O2 as able-she was previously discharged on 2 L of oxygen  2. Nausea vomiting and constipation -transient -resumed diet but appetite is poor -KUB reveals a moderate stool burden-she had a BM today -laxatives PRN  3. DM2 -stable, SSI  4. Hypertension: -stable, continue amlodipine  5. Hypothyroidism -Cont levothyroxine  6. chronic atrial fibrillation -continue Lopressor for rate control and aspirin  Ambulate, Pt/OT consulted, eval pending  Code Status: Full Code Family Communication: none at bedside Disposition Plan: home vs SNF  HPI/Subjective: She has no complaints today but appetite remains poor. She told the nurse that she was having some nausea.  Objective: Filed Vitals:    01/27/14 0552  BP: 146/64  Pulse: 56  Temp: 98.1 F (36.7 C)  Resp: 16    Intake/Output Summary (Last 24 hours) at 01/27/14 0938 Last data filed at 01/27/14 0339  Gross per 24 hour  Intake    780 ml  Output    701 ml  Net     79 ml   Filed Weights   01/25/14 0341 01/26/14 0517 01/27/14 0552  Weight: 55.293 kg (121 lb 14.4 oz) 54.84 kg (120 lb 14.4 oz) 54.568 kg (120 lb 4.8 oz)    Exam:   General:  AAOx3, frail elderly female, no distress  Cardiovascular: S1S2/RRR  Respiratory: basilar crackles  Abdomen: soft, NT, BS present  Musculoskeletal: no edema c/c   Data Reviewed: Basic Metabolic Panel:  Recent Labs Lab 01/21/14 0530 01/22/14 2052 01/23/14 0427 01/25/14 0415  NA 136* 137 134* 142  K 3.8 4.0 3.9 5.0  CL 98 97 95* 103  CO2 25 25 23 28   GLUCOSE 150* 215* 183* 127*  BUN 12 11 11 7   CREATININE 0.60 0.58 0.56 0.62  CALCIUM 9.2 9.4 8.5 9.6   Liver Function Tests:  Recent Labs Lab 01/23/14 0427  AST 19  ALT 8  ALKPHOS 46  BILITOT 0.6  PROT 6.3  ALBUMIN 2.1*   No results for input(s): LIPASE, AMYLASE in the last 168 hours. No results for input(s): AMMONIA in the last 168 hours. CBC:  Recent Labs Lab 01/21/14 0530 01/22/14 2052 01/23/14 0427 01/25/14 0415  WBC 6.2 11.7* 11.2* 7.4  NEUTROABS  --   --  9.8*  --   HGB 10.7* 12.1 10.1* 11.3*  HCT 32.9* 36.8 30.7* 33.8*  MCV 96.8 95.8 94.8 97.4  PLT  261 287 252 293   Cardiac Enzymes: No results for input(s): CKTOTAL, CKMB, CKMBINDEX, TROPONINI in the last 168 hours. BNP (last 3 results)  Recent Labs  01/18/14 1247 01/19/14 0850 01/22/14 2052  PROBNP 917.6* 889.5* 1026.0*   CBG:  Recent Labs Lab 01/26/14 0606 01/26/14 1112 01/26/14 1602 01/26/14 2059 01/27/14 0644  GLUCAP 134* 140* 132* 115* 133*    Recent Results (from the past 240 hour(s))  Blood culture (routine x 2)     Status: None   Collection Time: 01/18/14  2:58 PM  Result Value Ref Range Status   Specimen  Description BLOOD BLOOD RIGHT FOREARM  Final   Special Requests BOTTLES DRAWN AEROBIC AND ANAEROBIC 4 ML  Final   Culture  Setup Time   Final    01/18/2014 18:35 Performed at [place]   Final    NO GROWTH 5 DAYS Performed at [facility]    Report Status 01/24/2014 FINAL  Final  Blood culture (routine x 2)     Status: None   Collection Time: 01/18/14  2:59 PM  Result Value Ref Range Status   Specimen Description BLOOD RIGHT WRIST  Final   Special Requests BOTTLES DRAWN AEROBIC AND ANAEROBIC 3 ML  Final   Culture  Setup Time   Final    01/18/2014 18:34 Performed at [place]   Final    NO GROWTH 5 DAYS Performed at [facility]    Report Status 01/24/2014 FINAL  Final  Blood culture (routine x 2)     Status: None (Preliminary result)   Collection Time: 01/22/14 10:16 PM  Result Value Ref Range Status   Specimen Description BLOOD RIGHT FOREARM  Final   Special Requests BOTTLES DRAWN AEROBIC AND ANAEROBIC 10CC EA  Final   Culture  Setup Time   Final    01/23/2014 12:25 Performed at [facility]    Culture   Final           BLOOD CULTURE RECEIVED NO GROWTH TO DATE CULTURE WILL BE HELD FOR 5 DAYS BEFORE ISSUING A FINAL NEGATIVE REPORT Performed at [facility]    Report Status PENDING  Incomplete  Blood culture (routine x 2)     Status: None (Preliminary result)   Collection Time: 01/22/14 10:17 PM  Result Value Ref Range Status   Specimen Description BLOOD LEFT FOREARM  Final   Special Requests BOTTLES DRAWN AEROBIC AND ANAEROBIC 5.5CC EA  Final   Culture  Setup Time   Final    01/23/2014 11:51 Performed at [facility]    Culture   Final           BLOOD CULTURE RECEIVED NO GROWTH TO DATE CULTURE WILL BE HELD FOR 5 DAYS BEFORE ISSUING A FINAL NEGATIVE REPORT Performed at [facility]    Report Status PENDING  Incomplete     Studies: No results found.  Scheduled Meds: .  amLODipine  5 mg Oral Daily  . aspirin  81 mg Oral Daily  . dextromethorphan-guaiFENesin  1 tablet Oral BID  . enoxaparin (LOVENOX) injection  40 mg Subcutaneous Q24H  . FLUoxetine  20 mg Oral Daily  . insulin aspart  0-9 Units Subcutaneous TID WC  . levothyroxine  50 mcg Oral QAC breakfast  . metoprolol  50 mg Oral BID  . pantoprazole  40 mg Oral Daily  . piperacillin-tazobactam (ZOSYN)  IV  3.375 g Intravenous  Q8H  . polyethylene glycol  17 g Oral BID  . potassium chloride  10 mEq Oral Daily  . vancomycin  750 mg Intravenous Q24H   Continuous Infusions:   Antibiotics Given (last 72 hours)    Date/Time Action Medication Dose Rate   01/24/14 1114 Given   piperacillin-tazobactam (ZOSYN) IVPB 3.375 g 3.375 g 12.5 mL/hr   01/24/14 2221 Given   piperacillin-tazobactam (ZOSYN) IVPB 3.375 g 3.375 g 12.5 mL/hr   01/24/14 2228 Given   vancomycin (VANCOCIN) IVPB 750 mg/150 ml premix 750 mg 150 mL/hr   01/25/14 0428 Given   piperacillin-tazobactam (ZOSYN) IVPB 3.375 g 3.375 g 12.5 mL/hr   01/25/14 1323 Given   piperacillin-tazobactam (ZOSYN) IVPB 3.375 g 3.375 g 12.5 mL/hr   01/25/14 1959 Given   piperacillin-tazobactam (ZOSYN) IVPB 3.375 g 3.375 g 12.5 mL/hr   01/25/14 2124 Given   vancomycin (VANCOCIN) IVPB 750 mg/150 ml premix 750 mg 150 mL/hr   01/26/14 0418 Given   piperacillin-tazobactam (ZOSYN) IVPB 3.375 g 3.375 g 12.5 mL/hr   01/26/14 1238 Given   piperacillin-tazobactam (ZOSYN) IVPB 3.375 g 3.375 g 12.5 mL/hr   01/26/14 1941 Given   piperacillin-tazobactam (ZOSYN) IVPB 3.375 g 3.375 g 12.5 mL/hr   01/26/14 2142 Given   vancomycin (VANCOCIN) IVPB 750 mg/150 ml premix 750 mg 150 mL/hr   01/27/14 0334 Given   piperacillin-tazobactam (ZOSYN) IVPB 3.375 g 3.375 g 12.5 mL/hr      Time spent: 2min    Phillips Climes, MD  [facility] Pager [phone]. If 7PM-7AM, please contact night-coverage at [email or web address], password [facility] 01/27/2014, 9:38 AM  LOS: 5 days

## 2014-01-28 ENCOUNTER — Inpatient Hospital Stay (HOSPITAL_COMMUNITY): Payer: Medicare Other

## 2014-01-28 DIAGNOSIS — E039 Hypothyroidism, unspecified: Secondary | ICD-10-CM

## 2014-01-28 DIAGNOSIS — J9620 Acute and chronic respiratory failure, unspecified whether with hypoxia or hypercapnia: Secondary | ICD-10-CM

## 2014-01-28 DIAGNOSIS — J9621 Acute and chronic respiratory failure with hypoxia: Secondary | ICD-10-CM

## 2014-01-28 DIAGNOSIS — J849 Interstitial pulmonary disease, unspecified: Secondary | ICD-10-CM

## 2014-01-28 DIAGNOSIS — I482 Chronic atrial fibrillation: Secondary | ICD-10-CM

## 2014-01-28 LAB — BASIC METABOLIC PANEL
Anion gap: 14 (ref 5–15)
BUN: 12 mg/dL (ref 6–23)
CO2: 22 mEq/L (ref 19–32)
Calcium: 9.1 mg/dL (ref 8.4–10.5)
Chloride: 100 mEq/L (ref 96–112)
Creatinine, Ser: 0.69 mg/dL (ref 0.50–1.10)
GFR calc Af Amer: 85 mL/min — ABNORMAL LOW (ref >90)
GFR, EST NON AFRICAN AMERICAN: 73 mL/min — AB (ref >90)
GLUCOSE: 123 mg/dL — AB (ref 70–99)
Potassium: 3.5 mEq/L — ABNORMAL LOW (ref 3.7–5.3)
Sodium: 136 mEq/L — ABNORMAL LOW (ref 137–147)

## 2014-01-28 LAB — GLUCOSE, CAPILLARY
GLUCOSE-CAPILLARY: 143 mg/dL — AB (ref 70–99)
Glucose-Capillary: 112 mg/dL — ABNORMAL HIGH (ref 70–99)
Glucose-Capillary: 112 mg/dL — ABNORMAL HIGH (ref 70–99)
Glucose-Capillary: 241 mg/dL — ABNORMAL HIGH (ref 70–99)

## 2014-01-28 LAB — CBC
HEMATOCRIT: 31.6 % — AB (ref 36.0–46.0)
HEMOGLOBIN: 10.3 g/dL — AB (ref 12.0–15.0)
MCH: 31.1 pg (ref 26.0–34.0)
MCHC: 32.6 g/dL (ref 30.0–36.0)
MCV: 95.5 fL (ref 78.0–100.0)
Platelets: 312 10*3/uL (ref 150–400)
RBC: 3.31 MIL/uL — ABNORMAL LOW (ref 3.87–5.11)
RDW: 13.1 % (ref 11.5–15.5)
WBC: 7.1 10*3/uL (ref 4.0–10.5)

## 2014-01-28 LAB — SEDIMENTATION RATE: Sed Rate: 128 mm/hr — ABNORMAL HIGH (ref 0–22)

## 2014-01-28 LAB — PROCALCITONIN: PROCALCITONIN: 0.16 ng/mL

## 2014-01-28 LAB — VANCOMYCIN, TROUGH: Vancomycin Tr: 11.5 ug/mL (ref 10.0–20.0)

## 2014-01-28 MED ORDER — VANCOMYCIN HCL IN DEXTROSE 1-5 GM/200ML-% IV SOLN
1000.0000 mg | INTRAVENOUS | Status: DC
  Administered 2014-01-28: 1000 mg via INTRAVENOUS
  Filled 2014-01-28: qty 200

## 2014-01-28 MED ORDER — POTASSIUM CHLORIDE CRYS ER 20 MEQ PO TBCR
40.0000 meq | EXTENDED_RELEASE_TABLET | Freq: Once | ORAL | Status: AC
  Administered 2014-01-28: 40 meq via ORAL
  Filled 2014-01-28: qty 2

## 2014-01-28 MED ORDER — METHYLPREDNISOLONE SODIUM SUCC 125 MG IJ SOLR
60.0000 mg | Freq: Four times a day (QID) | INTRAMUSCULAR | Status: DC
  Administered 2014-01-28 – 2014-01-30 (×7): 60 mg via INTRAVENOUS
  Filled 2014-01-28 (×12): qty 0.96

## 2014-01-28 NOTE — Plan of Care (Signed)
Problem: Phase II Progression Outcomes Goal: Progress activity as tolerated unless otherwise ordered Outcome: Completed/Met Date Met:  01/28/14     

## 2014-01-28 NOTE — Progress Notes (Signed)
[facility] PROGRESS NOTE  Jodi Price GYJ:[id number] DOB: 04/17/1919 DOA: 01/22/2014 PCP: Adline Mango, MD   Brief Narrative: 80 yo female with dm2, htn, afib, dementia, just discharged from [facility] 11/20 after admission for pneumonia-  returned the following day with a complaint of shortness of breath.  Pt was found to have pox 80% by EMS, and brought to ED for evaluation. CXR revealed increase in bilateral infiltrates.  She was noted to have a fever of 102.6 the ER and WBC count was mildly elevated at 11.2. While in the hospital previously, the patient was receiving IV Rocephin and azithromycin and was changed to Levaquin but was noted to have a complaint of nausea and therefore Levaquin was changed to Augmentin. It was planned to continue Augmentin and azithromycin to complete 1 week. During this hospital stay patient was started on IV vancomycin, Zosyn, azithromycin, for  HCAP, azithromycin was discontinued on 11/23, patient remains afebrile, leukocytosis has resolved, but she remains hypoxic, requiring oxygen, which is not her baseline, repeat chest x-ray on 01/28/14 showing worsening pneumonia, CT chest without contrast showing significant bilateral lung disease, suspicious for UIP.   Assessment/Plan: 1. HCAP-with increasing pulmonary infiltrates -Started on vancomycin, azithromycin and Zosyn 11/21 on admission-it appears that Azithromycin was discontinued on 11/23 -Continue Vanco and Zosyn-symptoms continued to improve- -Blood cultures remain negative -Swallow eval completed, on D3 diet -Continue to wean O2 as able-she was previously discharged on 2 L of oxygen -Chest x-ray showing worsening infiltrate, so CT chest was obtained, as discussed with pulmonary on call Dr. Curt Jews is suspicious for UIP, and they recommend IV steroids to be started, and they will consult officially on the patient.  2. Nausea vomiting and constipation -transient -resumed diet but appetite is poor -KUB  reveals a moderate stool burden-she had a BM today -laxatives PRN  3. DM2 -stable, SSI -Monitor as on steroids  4. Hypertension: -stable, continue amlodipine  5. Hypothyroidism -Cont levothyroxine  6. chronic atrial fibrillation -continue Lopressor for rate control and aspirin  Ambulate, Pt/OT consulted, eval pending  Code Status: Full Code Family Communication: none at bedside Disposition Plan: home vs SNF  HPI/Subjective: She has no complaints today but appetite remains poor. She told the nurse that she was having some nausea.  Objective: Filed Vitals:   01/28/14 1609  BP: 130/54  Pulse: 52  Temp: 98.3 F (36.8 C)  Resp: 18    Intake/Output Summary (Last 24 hours) at 01/28/14 1706 Last data filed at 01/28/14 1650  Gross per 24 hour  Intake   1020 ml  Output    950 ml  Net     70 ml   Filed Weights   01/26/14 0517 01/27/14 0552 01/28/14 0423  Weight: 54.84 kg (120 lb 14.4 oz) 54.568 kg (120 lb 4.8 oz) 54.432 kg (120 lb)    Exam:   General:  AAOx3, frail elderly female, no distress  Cardiovascular: S1S2/RRR  Respiratory: basilar crackles  Abdomen: soft, NT, BS present  Musculoskeletal: no edema c/c   Data Reviewed: Basic Metabolic Panel:  Recent Labs Lab 01/22/14 2052 01/23/14 0427 01/25/14 0415 01/28/14 0344  NA 137 134* 142 136*  K 4.0 3.9 5.0 3.5*  CL 97 95* 103 100  CO2 25 23 28 22   GLUCOSE 215* 183* 127* 123*  BUN 11 11 7 12   CREATININE 0.58 0.56 0.62 0.69  CALCIUM 9.4 8.5 9.6 9.1   Liver Function Tests:  Recent Labs Lab 01/23/14 0427  AST 19  ALT 8  ALKPHOS 46  BILITOT 0.6  PROT 6.3  ALBUMIN 2.1*   No results for input(s): LIPASE, AMYLASE in the last 168 hours. No results for input(s): AMMONIA in the last 168 hours. CBC:  Recent Labs Lab 01/22/14 2052 01/23/14 0427 01/25/14 0415 01/28/14 0344  WBC 11.7* 11.2* 7.4 7.1  NEUTROABS  --  9.8*  --   --   HGB 12.1 10.1* 11.3* 10.3*  HCT 36.8 30.7* 33.8* 31.6*  MCV  95.8 94.8 97.4 95.5  PLT 287 252 293 312   Cardiac Enzymes: No results for input(s): CKTOTAL, CKMB, CKMBINDEX, TROPONINI in the last 168 hours. BNP (last 3 results)  Recent Labs  01/18/14 1247 01/19/14 0850 01/22/14 2052  PROBNP 917.6* 889.5* 1026.0*   CBG:  Recent Labs Lab 01/27/14 1652 01/27/14 2105 01/28/14 0625 01/28/14 1125 01/28/14 1645  GLUCAP 132* 117* 112* 143* 112*    Recent Results (from the past 240 hour(s))  Blood culture (routine x 2)     Status: None (Preliminary result)   Collection Time: 01/22/14 10:16 PM  Result Value Ref Range Status   Specimen Description BLOOD RIGHT FOREARM  Final   Special Requests BOTTLES DRAWN AEROBIC AND ANAEROBIC 10CC EA  Final   Culture  Setup Time   Final    01/23/2014 12:25 Performed at [facility]    Culture   Final           BLOOD CULTURE RECEIVED NO GROWTH TO DATE CULTURE WILL BE HELD FOR 5 DAYS BEFORE ISSUING A FINAL NEGATIVE REPORT Performed at [facility]    Report Status PENDING  Incomplete  Blood culture (routine x 2)     Status: None (Preliminary result)   Collection Time: 01/22/14 10:17 PM  Result Value Ref Range Status   Specimen Description BLOOD LEFT FOREARM  Final   Special Requests BOTTLES DRAWN AEROBIC AND ANAEROBIC 5.5CC EA  Final   Culture  Setup Time   Final    01/23/2014 11:51 Performed at [facility]    Culture   Final           BLOOD CULTURE RECEIVED NO GROWTH TO DATE CULTURE WILL BE HELD FOR 5 DAYS BEFORE ISSUING A FINAL NEGATIVE REPORT Performed at [facility]    Report Status PENDING  Incomplete     Studies: Ct Chest Wo Contrast  01/28/2014   CLINICAL DATA:  Dyspnea/ shortness of breath with fever.  EXAM: CT CHEST WITHOUT CONTRAST  TECHNIQUE: Multidetector CT imaging of the chest was performed following the standard protocol without IV contrast.  COMPARISON:  Chest x-ray 09/02/2012, 01/18/2014 and 01/28/2014 as well as previous chest CT 09/10/2012   FINDINGS: The lungs are adequately inflated and demonstrate evidence of chronic peripheral interstitial disease/honeycombing compatible with fibrosis. Mild bibasilar bronchiectatic change. There are new patchy areas of bilateral airspace opacification/consolidation likely representing superimposed acute infectious or inflammatory process. No evidence of effusion.  There is mild cardiomegaly. There is mild calcified plaque over the coronary arteries. Mild ectasia of the ascending thoracic aorta measuring 3.8 cm in AP diameter without significant change. There is calcified clip plaque over the thoracic aorta. There is mild mediastinal adenopathy with adjacent precarinal lymph nodes measuring 1.3 cm in short axis should 1.5 cm subcarinal lymph node. There is an air-fluid level over the mid to distal esophagus.  Images through the upper abdomen demonstrate a few small nonspecific lymph nodes adjacent the gastroesophageal junction unchanged. The there is a simple cyst over the upper  pole of the right kidney unchanged. Multiple thoracic spine compression fractures are unchanged.  IMPRESSION: Evidence of chronic pulmonary fibrosis with new patchy bilateral airspace opacification. Findings likely due to a superimposed acute infectious/ inflammatory process. Mild reactive mediastinal adenopathy.  Air-fluid level over the mid to distal esophagus. Findings may be due to dysmotility, reflux and less likely a partially obstructing process.  Mild cardiomegaly with 3 vessel atherosclerotic coronary artery disease.  Mild ectasia of the ascending thoracic aorta unchanged. Ectatic to mildly aneurysmal ascending thoracic aorta. Recommend annual imaging followup by CTA or MRA. This recommendation follows 2010 ACCF/AHA/AATS/ACR/ASA/SCA/SCAI/SIR/STS/SVM Guidelines for the Diagnosis and Management of Patients With Thoracic Aortic Disease. Circulation.2010; 121: E332-R518  Simple right renal cyst unchanged.  Multiple stable thoracic spine  compression fractures.   Electronically Signed   By: Marin Olp M.D.   On: 01/28/2014 14:28   Dg Chest Port 1 View  01/28/2014   CLINICAL DATA:  Acute respiratory failure  EXAM: PORTABLE CHEST - 1 VIEW  COMPARISON:  01/22/2014; 01/19/2014; 01/18/2014 09/02/2012; chest CT - 09/10/2012  FINDINGS: Grossly unchanged enlarged cardiac silhouette and mediastinal contours given persistently reduced lung volumes. Atherosclerotic plaque within the thoracic aorta. Pulmonary vasculature appears less distinct than present examination with cephalization of flow. Slight worsening of bilateral mid and lower lung heterogeneous/consolidative airspace opacities. No definite pleural effusion or pneumothorax. Unchanged bones. Surgical clips overlie the right axilla.  IMPRESSION: Findings suggestive of worsening pulmonary edema superimposed on unchanged to slight worsening multi focal infection and/or ARDS.   Electronically Signed   By: Sandi Mariscal M.D.   On: 01/28/2014 08:05    Scheduled Meds: . amLODipine  5 mg Oral Daily  . aspirin  81 mg Oral Daily  . dextromethorphan-guaiFENesin  1 tablet Oral BID  . enoxaparin (LOVENOX) injection  40 mg Subcutaneous Q24H  . FLUoxetine  20 mg Oral Daily  . insulin aspart  0-9 Units Subcutaneous TID WC  . levothyroxine  50 mcg Oral QAC breakfast  . methylPREDNISolone (SOLU-MEDROL) injection  60 mg Intravenous Q6H  . metoprolol  50 mg Oral BID  . pantoprazole  40 mg Oral Daily  . piperacillin-tazobactam (ZOSYN)  IV  3.375 g Intravenous Q8H  . polyethylene glycol  17 g Oral BID  . potassium chloride  10 mEq Oral Daily  . vancomycin  750 mg Intravenous Q24H   Continuous Infusions:   Antibiotics Given (last 72 hours)    Date/Time Action Medication Dose Rate   01/25/14 1959 Given   piperacillin-tazobactam (ZOSYN) IVPB 3.375 g 3.375 g 12.5 mL/hr   01/25/14 2124 Given   vancomycin (VANCOCIN) IVPB 750 mg/150 ml premix 750 mg 150 mL/hr   01/26/14 0418 Given    piperacillin-tazobactam (ZOSYN) IVPB 3.375 g 3.375 g 12.5 mL/hr   01/26/14 1238 Given   piperacillin-tazobactam (ZOSYN) IVPB 3.375 g 3.375 g 12.5 mL/hr   01/26/14 1941 Given   piperacillin-tazobactam (ZOSYN) IVPB 3.375 g 3.375 g 12.5 mL/hr   01/26/14 2142 Given   vancomycin (VANCOCIN) IVPB 750 mg/150 ml premix 750 mg 150 mL/hr   01/27/14 0334 Given   piperacillin-tazobactam (ZOSYN) IVPB 3.375 g 3.375 g 12.5 mL/hr   01/27/14 1238 Given   piperacillin-tazobactam (ZOSYN) IVPB 3.375 g 3.375 g 12.5 mL/hr   01/27/14 1924 Given   piperacillin-tazobactam (ZOSYN) IVPB 3.375 g 3.375 g 12.5 mL/hr   01/27/14 2204 Given   vancomycin (VANCOCIN) IVPB 750 mg/150 ml premix 750 mg 150 mL/hr   01/28/14 0334 Given   piperacillin-tazobactam (ZOSYN) IVPB 3.375  g 3.375 g 12.5 mL/hr   01/28/14 1226 Given   piperacillin-tazobactam (ZOSYN) IVPB 3.375 g 3.375 g 12.5 mL/hr      Time spent: 26min    Phillips Climes, MD  [facility] Pager [phone]. If 7PM-7AM, please contact night-coverage at [email or web address], password [facility] 01/28/2014, 5:06 PM  LOS: 6 days

## 2014-01-28 NOTE — Progress Notes (Signed)
ANTIBIOTIC CONSULT NOTE - FOLLOW UP  Pharmacy Consult for Vancomycin and Zosyn Indication: HCAP with possible aspiration  Allergies  Allergen Reactions  . Influenza Vaccines Anaphylaxis  . Nitrofurantoin Other (See Comments)    Avoid, due to possible cause of ILD  . Pneumococcal Vaccines Anaphylaxis    Patient Measurements: Height: 4\' 11"  (149.9 cm) Weight: 120 lb (54.432 kg) IBW/kg (Calculated) : 43.2  Vital Signs: Temp: 97.6 F (36.4 C) (11/27 2010) Temp Source: Oral (11/27 2010) BP: 132/61 mmHg (11/27 2010) Pulse Rate: 58 (11/27 2010) Intake/Output from previous day: 11/26 0701 - 11/27 0700 In: 900 [P.O.:600; IV Piggyback:300] Out: 750 [Urine:750]   Labs:  Recent Labs  01/28/14 0344  WBC 7.1  HGB 10.3*  PLT 312  CREATININE 0.69   Estimated Creatinine Clearance: 33.8 mL/min (by C-G formula based on Cr of 0.69).  Recent Labs  01/28/14 2142  VANCOTROUGH 11.5    Assessment:  Day # 6 Vancomycin and Zosyn.  Vancomycin trough level is below goal tonight (11.9 mcg/ml) on Vanc 750 mg IV q24hrs.  Cultures negative to date.  Afebrile, WBC 7.1, renal function stable.  Pulmonary consult tonight; steroids added for possible IPF flare or Nitrofurantoin-related flare-up.  Goal of Therapy:  Vancomycin trough level 15-20 mcg/ml  Plan:   Increase Vancomycin to 1 gram IV q24hrs.  Continue Zosyn 3.375 grams IV q8hrs (each over 4 hours).  Follow renal function, culture data, progress.  Added Nitrofurantoin to allergies, to try to help avoiding in the future.  Arty Baumgartner, [place] Pager: [phone] 01/28/2014,10:40 PM

## 2014-01-28 NOTE — Progress Notes (Signed)
Patient alert and oriented. Skin warm and dry. Patient has been up in recliner chair at bedside, and has also ambulated in the hallway with PT. She is currently on 2liters of O2 via nasal canula. While walking in the hall she did de-sat, and her o2 was turned up.After resting, we were able to turn her o2 back down to 2L.  Denies pain and discomfort. Has been eating at least 50% or more of her meals.

## 2014-01-28 NOTE — Plan of Care (Signed)
Problem: Phase II Progression Outcomes Goal: Other Phase II Outcomes/Goals Outcome: Not Applicable Date Met:  91/50/56

## 2014-01-28 NOTE — Plan of Care (Signed)
Problem: Phase III Progression Outcomes Goal: Tolerating diet Outcome: Completed/Met Date Met:  01/28/14

## 2014-01-28 NOTE — Plan of Care (Signed)
Problem: Phase II Progression Outcomes Goal: Wean O2 if indicated Outcome: Not Met (add Reason)

## 2014-01-28 NOTE — Plan of Care (Signed)
Problem: Phase II Progression Outcomes Goal: Pain controlled Outcome: Completed/Met Date Met:  01/28/14

## 2014-01-28 NOTE — Plan of Care (Signed)
Problem: Phase I Progression Outcomes Goal: Initial discharge plan identified Outcome: Completed/Met Date Met:  01/28/14

## 2014-01-28 NOTE — Progress Notes (Signed)
Physical Therapy Treatment Patient Details Name: Jodi Price MRN: [id number] DOB: 1919-06-19 Today's Date: 02-Feb-2014    History of Present Illness 80 yo female admitted HCAP. Discharged from [place] with same one day prior. Hx of HTN and Afib.    PT Comments    Pt doing well with mobility with rollator but becomes fatigued quickly when SaO2 drops. Pt plans to return to apt with hired assist at times.  Follow Up Recommendations  Home health PT;Supervision - Intermittent     Equipment Recommendations  None recommended by PT    Recommendations for Other Services       Precautions / Restrictions Precautions Precautions: Fall Precaution Comments: monitor sats/O2 Restrictions Weight Bearing Restrictions: No    Mobility  Bed Mobility Overal bed mobility: Modified Independent             General bed mobility comments: Incr time  Transfers Overall transfer level: Needs assistance Equipment used: Rolling walker (2 wheeled) Transfers: Sit to/from Stand Sit to Stand: Supervision;Min assist         General transfer comment: Min assist only from low commode. Pt has higher commode at home as well as lift chair.  Ambulation/Gait Ambulation/Gait assistance: Supervision Ambulation Distance (Feet): 90 Feet (90' x1, 60' x1) Assistive device: Rolling walker (2 wheeled) Gait Pattern/deviations: Step-through pattern;Decreased stride length;Trunk flexed     General Gait Details: As amb distance incr pt with dyspnea and SaO2 to 85% on 3L. Sitting rest break on rollator and O2 incr to 4L. SaO2 up to 91%. Amb back to room on 4L.   Stairs            Wheelchair Mobility    Modified Rankin (Stroke Patients Only)       Balance Overall balance assessment: Needs assistance Sitting-balance support: No upper extremity supported Sitting balance-Leahy Scale: Good     Standing balance support: Bilateral upper extremity supported Standing balance-Leahy Scale:  Poor Standing balance comment: support of walker                    Cognition Arousal/Alertness: Awake/alert Behavior During Therapy: WFL for tasks assessed/performed Overall Cognitive Status: Within Functional Limits for tasks assessed                      Exercises      General Comments        Pertinent Vitals/Pain Pain Assessment: No/denies pain    Home Living                      Prior Function            PT Goals (current goals can now be found in the care plan section) Progress towards PT goals: Progressing toward goals    Frequency  Min 3X/week    PT Plan Discharge plan needs to be updated;Frequency needs to be updated    Co-evaluation             End of Session Equipment Utilized During Treatment: Oxygen Activity Tolerance: Patient limited by fatigue Patient left: in chair;with call bell/phone within reach;with chair alarm set     Time: 0931-1007 PT Time Calculation (min) (ACUTE ONLY): 36 min  Charges:  $Gait Training: 23-37 mins                    G Codes:      Taila Basinski 2014/02/02, 10:21 AM  Suanne Marker PT [phone]

## 2014-01-28 NOTE — Consult Note (Addendum)
PULMONARY / CRITICAL CARE MEDICINE   Name: Jodi Price MRN: [id number] DOB: Apr 20, 1919    ADMISSION DATE:  01/22/2014 CONSULTATION DATE:  01/28/14  REFERRING MD :  Lucia Bitter   CHIEF COMPLAINT:  Abnormal CT chest  SIGNIFICANT EVENTS: 01/22/2014 - admit 01/28/2014 - pccm consult    HISTORY OF PRESENT ILLNESS:   80 year old female with multiple medical problems including dementia not otherwise specified, hiatal hernia, gastroesophageal reflux disease, arthritis presumably osteoarthritis without any history of collagen vascular disease.Marland Kitchen She was admitted at [facility] between 01/18/2014 and 01/21/2014 for a diagnosis of pneumonia not otherwise specified but readmitted on 01/22/2014 to [facility] with fever of 102.6, elevated white count of 11,200 and desaturating to 80%. A diagnosis of age Was made and she has been treated with extensive antibiotic therapy but a chest x-ray on 01/28/2014 today showed worsening pneumonia. Therefore CT scan of the chest was obtained which showed findings of interstitial lung disease and therefore pulmonary has been consulted.   She is in extreme for historian on account of her dementia and I'm unable to get any information from her. As best as I can tell her interstitial lung disease relevant history includes  1. Completely normal CT scan of the chest in 2010 and I will system and a normal CT scan of the abdomen lung cut in 2011 but by May 2013 CT thoracic spine shows evidence of interstitial lung disease which is confirmed to be a UIP pattern by summer 2014 and significantly worse on the CT scan of the chest at current admission along with associated ground glass opacities in the current CT scan of the chest suggestive of a UIP flare  2. My knowledge her chart does not include a history of collagen vascular disease  3. She has a history of hiatal hernia and acid reflux disease  4 . Review of the old chart shows some evaluation  for blood transfusion several years ago unclear details  5. Drug exposure: Review of the chart also shows chronic nitrofurantoin intake. A 2011 chart does not show this to be a chronic med and corresponding to this the CT scan of the chest was clear at this time. But in 2013 May her chart shows nitrofurantoin  to be a chronic regular medication and discontinued even on current admission chart. Interstitial lung disease findings seem to correlate with her being on nitrofurantoin   She endorses to dyspnea but timing, onset, character, aggravating, relieving factors and radiation all unknown. Severity is class 3-4 as best as I can tell  PAST MEDICAL HISTORY :   has a past medical history of Hypertension; Thyroid disease; Acid reflux disease; Depression; Diabetes mellitus; Urinary tract infection; Hypothyroidism; Blood transfusion; GERD (gastroesophageal reflux disease); H/O hiatal hernia; Cancer; Arthritis; Pneumonia; Atrial fibrillation; Chronic kidney disease; Dementia; Hyperlipidemia; Peripheral neuropathy; and Umbilical hernia.  has past surgical history that includes Breast surgery; Back surgery; Eye surgery; and Joint replacement. Prior to Admission medications   Medication Sig Start Date End Date Taking? Authorizing Provider  albuterol (PROVENTIL HFA;VENTOLIN HFA) 108 (90 BASE) MCG/ACT inhaler Inhale 2 puffs into the lungs every 6 (six) hours as needed for wheezing or shortness of breath. Use 2 puffs 3 times daily x 5 days then 2 puffs every 6 hours as needed. 01/21/14  Yes Eugenie Filler, MD  amLODipine (NORVASC) 5 MG tablet Take 5 mg by mouth daily.   Yes Historical Provider, MD  amoxicillin-clavulanate (AUGMENTIN) 875-125 MG per tablet Take 1 tablet  by mouth every 12 (twelve) hours. Take for 5 days then stop. 01/21/14  Yes Eugenie Filler, MD  aspirin 81 MG tablet Take 81 mg by mouth daily.   Yes Historical Provider, MD  azithromycin (ZITHROMAX) 250 MG tablet Take 1 tablet daily x 5 days  then stop. 01/21/14  Yes Eugenie Filler, MD  BIOTIN PO Take 1 tablet by mouth daily.   Yes Historical Provider, MD  dextromethorphan-guaiFENesin (MUCINEX DM) 30-600 MG per 12 hr tablet Take 1 tablet by mouth 2 (two) times daily. Take for 5 days then stop. 01/21/14  Yes Eugenie Filler, MD  feeding supplement, GLUCERNA SHAKE, (GLUCERNA SHAKE) LIQD Take 237 mLs by mouth daily. 01/21/14  Yes Eugenie Filler, MD  FLUoxetine (PROZAC) 20 MG capsule Take 20 mg by mouth daily.   Yes Historical Provider, MD  levothyroxine (SYNTHROID, LEVOTHROID) 50 MCG tablet Take 50 mcg by mouth daily.   Yes Historical Provider, MD  metoprolol (LOPRESSOR) 50 MG tablet Take 50 mg by mouth daily.   Yes Historical Provider, MD  nitrofurantoin, macrocrystal-monohydrate, (MACROBID) 100 MG capsule Take 1 capsule (100 mg total) by mouth 2 (two) times daily. Resume on 01/27/14. 01/27/14  Yes Eugenie Filler, MD  omeprazole (PRILOSEC) 40 MG capsule Take 40 mg by mouth daily.   Yes Historical Provider, MD  polyethylene glycol (MIRALAX / GLYCOLAX) packet Take 17 g by mouth 2 (two) times daily. 01/21/14  Yes Eugenie Filler, MD  potassium chloride (K-DUR) 10 MEQ tablet Take 10 mEq by mouth daily.   Yes Historical Provider, MD  senna-docusate (SENOKOT S) 8.6-50 MG per tablet Take 1 tablet by mouth at bedtime. 01/21/14  Yes Eugenie Filler, MD  traMADol (ULTRAM) 50 MG tablet Take 50 mg by mouth every 6 (six) hours as needed for moderate pain.    Yes Historical Provider, MD   Allergies  Allergen Reactions  . Influenza Vaccines Anaphylaxis  . Pneumococcal Vaccines Anaphylaxis    FAMILY HISTORY:  has no family status information on file.  SOCIAL HISTORY:  reports that she has never smoked. She has never used smokeless tobacco. She reports that she does not drink alcohol or use illicit drugs.  REVIEW OF SYSTEMS:  unelcitiable other than HPI due to dementia  SUBJECTIVE:   VITAL SIGNS: Temp:  [97.2 F (36.2 C)-98.3  F (36.8 C)] 97.6 F (36.4 C) (11/27 2010) Pulse Rate:  [52-58] 58 (11/27 2010) Resp:  [16-18] 16 (11/27 2010) BP: (123-133)/(54-63) 132/61 mmHg (11/27 2010) SpO2:  [85 %-100 %] 99 % (11/27 2010) Weight:  [54.432 kg (120 lb)] 54.432 kg (120 lb) (11/27 0423) HEMODYNAMICS:   VENTILATOR SETTINGS:   INTAKE / OUTPUT:  Intake/Output Summary (Last 24 hours) at 01/28/14 2019 Last data filed at 01/28/14 1816  Gross per 24 hour  Intake   1220 ml  Output   1275 ml  Net    -55 ml    PHYSICAL EXAMINATION: General:  Frail pleasant female Neuro:  Alert, pleasant cheerful. Speech is normal. Oriented 2 only HEENT:  Neck is supple no neck nodes Cardiovascular:  Normal heart sounds Lungs:  Normal work of breathing but bilateral extensive crackles especially anteriorly Abdomen:  Soft, nontender no organomegaly Musculoskeletal:  No cyanosis no clubbing no pedal edema but evidence of osteoarthritis present Skin:  Is intact without any evidence of scleroderma  LABS: PULMONARY No results for input(s): PHART, PCO2ART, PO2ART, HCO3, TCO2, O2SAT in the last 168 hours.  Invalid input(s): PCO2, PO2  CBC  Recent Labs Lab 01/23/14 0427 01/25/14 0415 01/28/14 0344  HGB 10.1* 11.3* 10.3*  HCT 30.7* 33.8* 31.6*  WBC 11.2* 7.4 7.1  PLT 252 293 312    COAGULATION No results for input(s): INR in the last 168 hours.  CARDIAC  No results for input(s): TROPONINI in the last 168 hours.  Recent Labs Lab 01/22/14 2052  PROBNP 1026.0*     CHEMISTRY  Recent Labs Lab 01/22/14 2052 01/23/14 0427 01/25/14 0415 01/28/14 0344  NA 137 134* 142 136*  K 4.0 3.9 5.0 3.5*  CL 97 95* 103 100  CO2 25 23 28 22   GLUCOSE 215* 183* 127* 123*  BUN 11 11 7 12   CREATININE 0.58 0.56 0.62 0.69  CALCIUM 9.4 8.5 9.6 9.1   Estimated Creatinine Clearance: 33.8 mL/min (by C-G formula based on Cr of 0.69).   LIVER  Recent Labs Lab 01/23/14 0427  AST 19  ALT 8  ALKPHOS 46  BILITOT 0.6  PROT 6.3   ALBUMIN 2.1*     INFECTIOUS No results for input(s): LATICACIDVEN, PROCALCITON in the last 168 hours.   ENDOCRINE CBG (last 3)   Recent Labs  01/28/14 0625 01/28/14 1125 01/28/14 1645  GLUCAP 112* 143* 112*         IMAGING x48h Ct Chest Wo Contrast  01/28/2014   CLINICAL DATA:  Dyspnea/ shortness of breath with fever.  EXAM: CT CHEST WITHOUT CONTRAST  TECHNIQUE: Multidetector CT imaging of the chest was performed following the standard protocol without IV contrast.  COMPARISON:  Chest x-ray 09/02/2012, 01/18/2014 and 01/28/2014 as well as previous chest CT 09/10/2012  FINDINGS: The lungs are adequately inflated and demonstrate evidence of chronic peripheral interstitial disease/honeycombing compatible with fibrosis. Mild bibasilar bronchiectatic change. There are new patchy areas of bilateral airspace opacification/consolidation likely representing superimposed acute infectious or inflammatory process. No evidence of effusion.  There is mild cardiomegaly. There is mild calcified plaque over the coronary arteries. Mild ectasia of the ascending thoracic aorta measuring 3.8 cm in AP diameter without significant change. There is calcified clip plaque over the thoracic aorta. There is mild mediastinal adenopathy with adjacent precarinal lymph nodes measuring 1.3 cm in short axis should 1.5 cm subcarinal lymph node. There is an air-fluid level over the mid to distal esophagus.  Images through the upper abdomen demonstrate a few small nonspecific lymph nodes adjacent the gastroesophageal junction unchanged. The there is a simple cyst over the upper pole of the right kidney unchanged. Multiple thoracic spine compression fractures are unchanged.  IMPRESSION: Evidence of chronic pulmonary fibrosis with new patchy bilateral airspace opacification. Findings likely due to a superimposed acute infectious/ inflammatory process. Mild reactive mediastinal adenopathy.  Air-fluid level over the mid to  distal esophagus. Findings may be due to dysmotility, reflux and less likely a partially obstructing process.  Mild cardiomegaly with 3 vessel atherosclerotic coronary artery disease.  Mild ectasia of the ascending thoracic aorta unchanged. Ectatic to mildly aneurysmal ascending thoracic aorta. Recommend annual imaging followup by CTA or MRA. This recommendation follows 2010 ACCF/AHA/AATS/ACR/ASA/SCA/SCAI/SIR/STS/SVM Guidelines for the Diagnosis and Management of Patients With Thoracic Aortic Disease. Circulation.2010; 121: Y641-R830  Simple right renal cyst unchanged.  Multiple stable thoracic spine compression fractures.   Electronically Signed   By: Marin Olp M.D.   On: 01/28/2014 14:28   Dg Chest Port 1 View  01/28/2014   CLINICAL DATA:  Acute respiratory failure  EXAM: PORTABLE CHEST - 1 VIEW  COMPARISON:  01/22/2014; 01/19/2014; 01/18/2014 09/02/2012; chest CT -  09/10/2012  FINDINGS: Grossly unchanged enlarged cardiac silhouette and mediastinal contours given persistently reduced lung volumes. Atherosclerotic plaque within the thoracic aorta. Pulmonary vasculature appears less distinct than present examination with cephalization of flow. Slight worsening of bilateral mid and lower lung heterogeneous/consolidative airspace opacities. No definite pleural effusion or pneumothorax. Unchanged bones. Surgical clips overlie the right axilla.  IMPRESSION: Findings suggestive of worsening pulmonary edema superimposed on unchanged to slight worsening multi focal infection and/or ARDS.   Electronically Signed   By: Sandi Mariscal M.D.   On: 01/28/2014 08:05       ASSESSMENT / PLAN:  PULMONARY A: Acute on chronic respiratory failure on account of interstitial lung disease - NEW PROBLEM - Due to history of chronic nitrofurantoin exposure this is most likely cause of interstitial lung disease along with flareup - Alternate etiology is collagen-vascular disease related interstitial lung disease - Third  possibility is idiopathic pulmonary fibrosis with current admissions being due to flareup - Acid reflux disease and hiatal hernia as a risk factor for interstitial lung disease   P:   -Check autoimmune panel  -IV  Solu-Medrol 60mg  q6h (fir IPF flare or nitrafurantoin related flare up)  -Monitor over several weeks for improvement  -If autoimmune panel negative and over several weeks or months she does not improve then the diagnosis of idiopathic pulmonary fibrosis which has a bad prognosis and is generally a terminal diagnosis even though the specific reviewed and available to slow the progression of this disease I doubt she would be a candidate for that given her frail and elderly age but this is a discussion for the outpatient setting  -Definitely continue acid reflux therapy  - NO NITRAFURANTOIN EVER  -Check pro calcitonin and if negative stop antibiotic therapy  -Will check BNP  -Continue oxygen to keep pulse ox greater than 88%  -Would advise symptom management through palliative care especially this no rapid response with steroids   - OPD followup with Dr Chase Caller ; please set up [phone]) on discharge     Pulmonary service will see the patient again on Monday, 01/31/2014     Dr. Brand Males, M.D., F.C.C.P Pulmonary and Critical Care Medicine Staff Physician [facility] Pager: [phone], If no answer or between  15:00h - 7:00h: call [phone]  01/28/2014 8:38 PM

## 2014-01-28 NOTE — Plan of Care (Signed)
Problem: Phase II Progression Outcomes Goal: Discharge plan established Outcome: Completed/Met Date Met:  01/28/14     

## 2014-01-28 NOTE — Plan of Care (Signed)
Problem: Phase III Progression Outcomes Goal: Pain controlled on oral analgesia Outcome: Completed/Met Date Met:  01/28/14     

## 2014-01-28 NOTE — Plan of Care (Signed)
Problem: Phase II Progression Outcomes Goal: Tolerating diet Outcome: Completed/Met Date Met:  01/28/14     

## 2014-01-28 NOTE — Progress Notes (Signed)
ANTIBIOTIC CONSULT NOTE - FOLLOW UP  Pharmacy Consult for vancomycin and Zosyn Indication: HCAP with possible aspiration  Allergies  Allergen Reactions  . Influenza Vaccines Anaphylaxis  . Pneumococcal Vaccines Anaphylaxis    Patient Measurements: Height: 4\' 11"  (149.9 cm) Weight: 120 lb (54.432 kg) IBW/kg (Calculated) : 43.2  Vital Signs: Temp: 97.4 F (36.3 C) (11/27 1034) Temp Source: Oral (11/27 1034) BP: 123/63 mmHg (11/27 1034) Pulse Rate: 55 (11/27 1034) Intake/Output from previous day: 11/26 0701 - 11/27 0700 In: 900 [P.O.:600; IV Piggyback:300] Out: 750 [Urine:750] Intake/Output from this shift: Total I/O In: 720 [P.O.:720] Out: -   Labs:  Recent Labs  01/28/14 0344  WBC 7.1  HGB 10.3*  PLT 312  CREATININE 0.69   Estimated Creatinine Clearance: 33.8 mL/min (by C-G formula based on Cr of 0.69). No results for input(s): VANCOTROUGH, VANCOPEAK, VANCORANDOM, GENTTROUGH, GENTPEAK, GENTRANDOM, TOBRATROUGH, TOBRAPEAK, TOBRARND, AMIKACINPEAK, AMIKACINTROU, AMIKACIN in the last 72 hours.   Microbiology: Recent Results (from the past 720 hour(s))  Blood culture (routine x 2)     Status: None   Collection Time: 01/18/14  2:58 PM  Result Value Ref Range Status   Specimen Description BLOOD BLOOD RIGHT FOREARM  Final   Special Requests BOTTLES DRAWN AEROBIC AND ANAEROBIC 4 ML  Final   Culture  Setup Time   Final    01/18/2014 18:35 Performed at [facility]   Final    NO GROWTH 5 DAYS Performed at [facility]    Report Status 01/24/2014 FINAL  Final  Blood culture (routine x 2)     Status: None   Collection Time: 01/18/14  2:59 PM  Result Value Ref Range Status   Specimen Description BLOOD RIGHT WRIST  Final   Special Requests BOTTLES DRAWN AEROBIC AND ANAEROBIC 3 ML  Final   Culture  Setup Time   Final    01/18/2014 18:34 Performed at [place] [facility]   Final    NO GROWTH 5 DAYS Performed at [facility]    Report Status 01/24/2014 FINAL  Final  Blood culture (routine x 2)     Status: None (Preliminary result)   Collection Time: 01/22/14 10:16 PM  Result Value Ref Range Status   Specimen Description BLOOD RIGHT FOREARM  Final   Special Requests BOTTLES DRAWN AEROBIC AND ANAEROBIC 10CC EA  Final   Culture  Setup Time   Final    01/23/2014 12:25 Performed at [facility]    Culture   Final           BLOOD CULTURE RECEIVED NO GROWTH TO DATE CULTURE WILL BE HELD FOR 5 DAYS BEFORE ISSUING A FINAL NEGATIVE REPORT Performed at [facility]    Report Status PENDING  Incomplete  Blood culture (routine x 2)     Status: None (Preliminary result)   Collection Time: 01/22/14 10:17 PM  Result Value Ref Range Status   Specimen Description BLOOD LEFT FOREARM  Final   Special Requests BOTTLES DRAWN AEROBIC AND ANAEROBIC 5.5CC EA  Final   Culture  Setup Time   Final    01/23/2014 11:51 Performed at [facility]    Culture   Final           BLOOD CULTURE RECEIVED NO GROWTH TO DATE CULTURE WILL BE HELD FOR 5 DAYS BEFORE ISSUING A FINAL NEGATIVE REPORT Performed at [facility]    Report Status PENDING  Incomplete  Anti-infectives    Start     Dose/Rate Route Frequency Ordered Stop   01/23/14 2200  vancomycin (VANCOCIN) IVPB 750 mg/150 ml premix     750 mg150 mL/hr over 60 Minutes Intravenous Every 24 hours 01/22/14 2344     01/23/14 0400  piperacillin-tazobactam (ZOSYN) IVPB 3.375 g     3.375 g12.5 mL/hr over 240 Minutes Intravenous Every 8 hours 01/22/14 2344     01/22/14 2345  azithromycin (ZITHROMAX) 500 mg in dextrose 5 % 250 mL IVPB  Status:  Discontinued     500 mg250 mL/hr over 60 Minutes Intravenous Every 24 hours 01/22/14 2332 01/24/14 1232   01/22/14 2200  piperacillin-tazobactam (ZOSYN) IVPB 3.375 g     3.375 g100 mL/hr over 30 Minutes Intravenous  Once 01/22/14 2145 01/22/14 2303   01/22/14 2200  vancomycin (VANCOCIN) IVPB 1000 mg/200 mL  premix     1,000 mg200 mL/hr over 60 Minutes Intravenous  Once 01/22/14 2145 01/22/14 2337      Assessment: 80 y/o female on day 7 vancomycin and Zosyn for HCAP with possible aspiration. CXR from today w/ worsening pulm edema superimposed on infection and/or ARDS. CT chest pending. WBC are normal, she is afebrile, and renal function is stable. Culture data is negative thus far.   Goal of Therapy:  Vancomycin trough level 15-20 mcg/ml  Plan:  - Continue vancomycin 750 mg IV q24h - Continue Zosyn 3.375 g IV q8h to be infused over 4 hours - Vancomycin trough at 21:30 tonight as unsure when this will be stopped - Monitor renal function, clinical progress, and culture data  [facility], [place].D., BCPS Clinical Pharmacist Pager: [phone] 01/28/2014 12:48 PM

## 2014-01-28 NOTE — Plan of Care (Signed)
Problem: Phase I Progression Outcomes Goal: Dyspnea controlled at rest Outcome: Completed/Met Date Met:  01/28/14     

## 2014-01-29 LAB — CULTURE, BLOOD (ROUTINE X 2)
CULTURE: NO GROWTH
Culture: NO GROWTH

## 2014-01-29 LAB — GLUCOSE, CAPILLARY
Glucose-Capillary: 226 mg/dL — ABNORMAL HIGH (ref 70–99)
Glucose-Capillary: 213 mg/dL — ABNORMAL HIGH (ref 70–99)
Glucose-Capillary: 174 mg/dL — ABNORMAL HIGH (ref 70–99)
Glucose-Capillary: 230 mg/dL — ABNORMAL HIGH (ref 70–99)

## 2014-01-29 LAB — BASIC METABOLIC PANEL
Anion gap: 11 (ref 5–15)
BUN: 14 mg/dL (ref 6–23)
CO2: 25 mEq/L (ref 19–32)
CREATININE: 0.68 mg/dL (ref 0.50–1.10)
Calcium: 9.2 mg/dL (ref 8.4–10.5)
Chloride: 100 mEq/L (ref 96–112)
GFR calc Af Amer: 85 mL/min — ABNORMAL LOW (ref >90)
GFR, EST NON AFRICAN AMERICAN: 74 mL/min — AB (ref >90)
GLUCOSE: 242 mg/dL — AB (ref 70–99)
POTASSIUM: 4.4 meq/L (ref 3.7–5.3)
Sodium: 136 mEq/L — ABNORMAL LOW (ref 137–147)

## 2014-01-29 LAB — CBC
HEMATOCRIT: 32 % — AB (ref 36.0–46.0)
HEMOGLOBIN: 10.6 g/dL — AB (ref 12.0–15.0)
MCH: 31.2 pg (ref 26.0–34.0)
MCHC: 33.1 g/dL (ref 30.0–36.0)
MCV: 94.1 fL (ref 78.0–100.0)
Platelets: 322 10*3/uL (ref 150–400)
RBC: 3.4 MIL/uL — ABNORMAL LOW (ref 3.87–5.11)
RDW: 12.7 % (ref 11.5–15.5)
WBC: 4.8 10*3/uL (ref 4.0–10.5)

## 2014-01-29 LAB — PRO B NATRIURETIC PEPTIDE: Pro B Natriuretic peptide (BNP): 1260 pg/mL — ABNORMAL HIGH (ref 0–450)

## 2014-01-29 LAB — RHEUMATOID FACTOR

## 2014-01-29 LAB — SEDIMENTATION RATE: SED RATE: 130 mm/h — AB (ref 0–22)

## 2014-01-29 NOTE — Progress Notes (Signed)
Physical Therapy Treatment Patient Details Name: MALARY AYLESWORTH MRN: [id number] DOB: Nov 08, 1919 Today's Date: Feb 23, 2014    History of Present Illness 80 yo female admitted HCAP. Discharged from [place] with same one day prior. Hx of HTN and Afib.    PT Comments    Pt making steady progress. Pt with improved SaO2 with mobility when O2 incr to 4L prior to amb. Concerned with pt being able to manage O2 with activity at home at dc. Family/pt want pt to return to independent apt but will likely need incr assistance to manage that. If unable to arrange that may yet need ST-SNF.  Follow Up Recommendations  Home health PT;Supervision for mobility/OOB     Equipment Recommendations  None recommended by PT    Recommendations for Other Services       Precautions / Restrictions Precautions Precautions: Fall Precaution Comments: monitor sats/O2    Mobility  Bed Mobility Overal bed mobility: Modified Independent             General bed mobility comments: Incr time  Transfers Overall transfer level: Needs assistance Equipment used: Rolling walker (2 wheeled) Transfers: Sit to/from Stand Sit to Stand: Supervision            Ambulation/Gait Ambulation/Gait assistance: Supervision Ambulation Distance (Feet): 150 Feet (150' x 1, 100' x1) Assistive device: 4-wheeled walker Gait Pattern/deviations: Step-through pattern;Decreased stride length;Trunk flexed     General Gait Details: Incr O2 to 4L prior to amb and pt able to maintain SaO2 >95% with amb. Pt with 1 sitting rest break.    Stairs            Wheelchair Mobility    Modified Rankin (Stroke Patients Only)       Balance   Sitting-balance support: No upper extremity supported;Feet supported Sitting balance-Leahy Scale: Good     Standing balance support: Bilateral upper extremity supported Standing balance-Leahy Scale: Poor Standing balance comment: support of rollator                     Cognition Arousal/Alertness: Awake/alert Behavior During Therapy: WFL for tasks assessed/performed Overall Cognitive Status: Within Functional Limits for tasks assessed       Memory: Decreased short-term memory              Exercises      General Comments        Pertinent Vitals/Pain Pain Assessment: No/denies pain    Home Living                      Prior Function            PT Goals (current goals can now be found in the care plan section) Progress towards PT goals: Progressing toward goals    Frequency  Min 3X/week    PT Plan Current plan remains appropriate    Co-evaluation             End of Session Equipment Utilized During Treatment: Gait belt;Oxygen Activity Tolerance: Patient tolerated treatment well Patient left: in bed;with call bell/phone within reach;with family/visitor present     Time: 1884-1660 PT Time Calculation (min) (ACUTE ONLY): 33 min  Charges:  $Gait Training: 23-37 mins                    G Codes:      Asmi Fugere 2014/02/23, 5:34 PM  [facility] PT [phone]

## 2014-01-29 NOTE — Plan of Care (Signed)
Problem: Phase III Progression Outcomes Goal: Convert IV antibiotics to PO Outcome: Progressing IV antibiotics discontinued.

## 2014-01-29 NOTE — Progress Notes (Signed)
[facility] PROGRESS NOTE  Jodi Price WUJ:[id number] DOB: 19-Aug-1919 DOA: 01/22/2014 PCP: Adline Mango, MD   Brief Narrative: 80 yo female with dm2, htn, afib, dementia, just discharged from [facility] 11/20 after admission for pneumonia-  returned the following day with a complaint of shortness of breath.  Pt was found to have pox 80% by EMS, and brought to ED for evaluation. CXR revealed increase in bilateral infiltrates.  She was noted to have a fever of 102.6 the ER and WBC count was mildly elevated at 11.2. While in the hospital previously, the patient was receiving IV Rocephin and azithromycin and was changed to Levaquin but was noted to have a complaint of nausea and therefore Levaquin was changed to Augmentin. It was planned to continue Augmentin and azithromycin to complete 1 week. During this hospital stay patient was started on IV vancomycin, Zosyn, azithromycin, for  HCAP, azithromycin was discontinued on 11/23, patient remains afebrile, leukocytosis has resolved, but she remains hypoxic, requiring oxygen, which is not her baseline, repeat chest x-ray on 01/28/14 showing worsening pneumonia, CT chest without contrast showing significant bilateral lung disease, suspicious for UIP. Patient was seen by pulmonary, she was started on IV steroids.   Assessment/Plan:   HCAP-with increasing pulmonary infiltrates -Started on vancomycin, azithromycin and Zosyn 11/21 on admission-it appears that Azithromycin was discontinued on 11/23, vancomycin and Zosyn and stopped on 01/29/14. - Blood cultures remain negative -Swallow eval completed, on D3 diet -Continue to wean O2 as able-she was previously discharged on 2 L of oxygen -Chest x-ray showing worsening infiltrate, so CT chest was obtained, as discussed with pulmonary on call Dr. Curt Jews is suspicious for UIP, and they recommend IV steroids to be started, and they will consult officially on the patient.  Acute on chronic respiratory  failure/interstitial lung disease - Pulmonary were consulted, suspicion is for interstitial lung disease, possible idiopathic pulmonary fibrosis. -Continue with IV steroids -Avoid nitrofurantoin in the future as it might be the cause of her interstitial lung disease.  Nausea vomiting and constipation -transient -resumed diet but appetite is poor -KUB reveals a moderate stool burden -laxatives PRN   DM2 -Uncontrolled, continue with SSI -Monitor as on steroids, may need to add long-acting insulin if remains uncontrolled  Hypertension: -stable, continue amlodipine   Hypothyroidism -Cont levothyroxine   chronic atrial fibrillation -continue Lopressor for rate control and aspirin    Code Status: Full Code Family Communication: Discussed with daughter over the phone 01/28/14 Disposition Plan: home vs SNF  HPI/Subjective: She has no complaints today , no shortness of breath, no chest pain, no fever, and platelets of mild cough but nonproductive.  Objective: Filed Vitals:   01/29/14 0922  BP: 126/60  Pulse: 60  Temp:   Resp: 18    Intake/Output Summary (Last 24 hours) at 01/29/14 1040 Last data filed at 01/29/14 0448  Gross per 24 hour  Intake   1070 ml  Output   1575 ml  Net   -505 ml   Filed Weights   01/27/14 0552 01/28/14 0423 01/29/14 0436  Weight: 54.568 kg (120 lb 4.8 oz) 54.432 kg (120 lb) 56.7 kg (125 lb)    Exam:   General:  AAOx3, frail elderly female, no distress  Cardiovascular: S1S2/RRR  Respiratory: basilar crackles  Abdomen: soft, NT, BS present  Musculoskeletal: no edema c/c   Data Reviewed: Basic Metabolic Panel:  Recent Labs Lab 01/22/14 2052 01/23/14 0427 01/25/14 0415 01/28/14 0344 01/29/14 0300  NA 137 134* 142 136* 136*  K  4.0 3.9 5.0 3.5* 4.4  CL 97 95* 103 100 100  CO2 25 23 28 22 25   GLUCOSE 215* 183* 127* 123* 242*  BUN 11 11 7 12 14   CREATININE 0.58 0.56 0.62 0.69 0.68  CALCIUM 9.4 8.5 9.6 9.1 9.2   Liver  Function Tests:  Recent Labs Lab 01/23/14 0427  AST 19  ALT 8  ALKPHOS 46  BILITOT 0.6  PROT 6.3  ALBUMIN 2.1*   No results for input(s): LIPASE, AMYLASE in the last 168 hours. No results for input(s): AMMONIA in the last 168 hours. CBC:  Recent Labs Lab 01/22/14 2052 01/23/14 0427 01/25/14 0415 01/28/14 0344 01/29/14 0300  WBC 11.7* 11.2* 7.4 7.1 4.8  NEUTROABS  --  9.8*  --   --   --   HGB 12.1 10.1* 11.3* 10.3* 10.6*  HCT 36.8 30.7* 33.8* 31.6* 32.0*  MCV 95.8 94.8 97.4 95.5 94.1  PLT 287 252 293 312 322   Cardiac Enzymes: No results for input(s): CKTOTAL, CKMB, CKMBINDEX, TROPONINI in the last 168 hours. BNP (last 3 results)  Recent Labs  01/19/14 0850 01/22/14 2052 01/29/14 0300  PROBNP 889.5* 1026.0* 1260.0*   CBG:  Recent Labs Lab 01/28/14 0625 01/28/14 1125 01/28/14 1645 01/28/14 2120 01/29/14 0621  GLUCAP 112* 143* 112* 241* 226*    Recent Results (from the past 240 hour(s))  Blood culture (routine x 2)     Status: None (Preliminary result)   Collection Time: 01/22/14 10:16 PM  Result Value Ref Range Status   Specimen Description BLOOD RIGHT FOREARM  Final   Special Requests BOTTLES DRAWN AEROBIC AND ANAEROBIC 10CC EA  Final   Culture  Setup Time   Final    01/23/2014 12:25 Performed at [facility]    Culture   Final           BLOOD CULTURE RECEIVED NO GROWTH TO DATE CULTURE WILL BE HELD FOR 5 DAYS BEFORE ISSUING A FINAL NEGATIVE REPORT Performed at [facility]    Report Status PENDING  Incomplete  Blood culture (routine x 2)     Status: None (Preliminary result)   Collection Time: 01/22/14 10:17 PM  Result Value Ref Range Status   Specimen Description BLOOD LEFT FOREARM  Final   Special Requests BOTTLES DRAWN AEROBIC AND ANAEROBIC 5.5CC EA  Final   Culture  Setup Time   Final    01/23/2014 11:51 Performed at [facility]    Culture   Final           BLOOD CULTURE RECEIVED NO GROWTH TO DATE CULTURE WILL  BE HELD FOR 5 DAYS BEFORE ISSUING A FINAL NEGATIVE REPORT Performed at [facility]    Report Status PENDING  Incomplete     Studies: Ct Chest Wo Contrast  01/28/2014   CLINICAL DATA:  Dyspnea/ shortness of breath with fever.  EXAM: CT CHEST WITHOUT CONTRAST  TECHNIQUE: Multidetector CT imaging of the chest was performed following the standard protocol without IV contrast.  COMPARISON:  Chest x-ray 09/02/2012, 01/18/2014 and 01/28/2014 as well as previous chest CT 09/10/2012  FINDINGS: The lungs are adequately inflated and demonstrate evidence of chronic peripheral interstitial disease/honeycombing compatible with fibrosis. Mild bibasilar bronchiectatic change. There are new patchy areas of bilateral airspace opacification/consolidation likely representing superimposed acute infectious or inflammatory process. No evidence of effusion.  There is mild cardiomegaly. There is mild calcified plaque over the coronary arteries. Mild ectasia of the ascending thoracic aorta measuring 3.8 cm  in AP diameter without significant change. There is calcified clip plaque over the thoracic aorta. There is mild mediastinal adenopathy with adjacent precarinal lymph nodes measuring 1.3 cm in short axis should 1.5 cm subcarinal lymph node. There is an air-fluid level over the mid to distal esophagus.  Images through the upper abdomen demonstrate a few small nonspecific lymph nodes adjacent the gastroesophageal junction unchanged. The there is a simple cyst over the upper pole of the right kidney unchanged. Multiple thoracic spine compression fractures are unchanged.  IMPRESSION: Evidence of chronic pulmonary fibrosis with new patchy bilateral airspace opacification. Findings likely due to a superimposed acute infectious/ inflammatory process. Mild reactive mediastinal adenopathy.  Air-fluid level over the mid to distal esophagus. Findings may be due to dysmotility, reflux and less likely a partially obstructing process.   Mild cardiomegaly with 3 vessel atherosclerotic coronary artery disease.  Mild ectasia of the ascending thoracic aorta unchanged. Ectatic to mildly aneurysmal ascending thoracic aorta. Recommend annual imaging followup by CTA or MRA. This recommendation follows 2010 ACCF/AHA/AATS/ACR/ASA/SCA/SCAI/SIR/STS/SVM Guidelines for the Diagnosis and Management of Patients With Thoracic Aortic Disease. Circulation.2010; 121: E831-D176  Simple right renal cyst unchanged.  Multiple stable thoracic spine compression fractures.   Electronically Signed   By: Marin Olp M.D.   On: 01/28/2014 14:28   Dg Chest Port 1 View  01/28/2014   CLINICAL DATA:  Acute respiratory failure  EXAM: PORTABLE CHEST - 1 VIEW  COMPARISON:  01/22/2014; 01/19/2014; 01/18/2014 09/02/2012; chest CT - 09/10/2012  FINDINGS: Grossly unchanged enlarged cardiac silhouette and mediastinal contours given persistently reduced lung volumes. Atherosclerotic plaque within the thoracic aorta. Pulmonary vasculature appears less distinct than present examination with cephalization of flow. Slight worsening of bilateral mid and lower lung heterogeneous/consolidative airspace opacities. No definite pleural effusion or pneumothorax. Unchanged bones. Surgical clips overlie the right axilla.  IMPRESSION: Findings suggestive of worsening pulmonary edema superimposed on unchanged to slight worsening multi focal infection and/or ARDS.   Electronically Signed   By: Sandi Mariscal M.D.   On: 01/28/2014 08:05    Scheduled Meds: . amLODipine  5 mg Oral Daily  . aspirin  81 mg Oral Daily  . dextromethorphan-guaiFENesin  1 tablet Oral BID  . enoxaparin (LOVENOX) injection  40 mg Subcutaneous Q24H  . FLUoxetine  20 mg Oral Daily  . insulin aspart  0-9 Units Subcutaneous TID WC  . levothyroxine  50 mcg Oral QAC breakfast  . methylPREDNISolone (SOLU-MEDROL) injection  60 mg Intravenous Q6H  . metoprolol  50 mg Oral BID  . pantoprazole  40 mg Oral Daily  . polyethylene  glycol  17 g Oral BID  . potassium chloride  10 mEq Oral Daily   Continuous Infusions:   Antibiotics Given (last 72 hours)    Date/Time Action Medication Dose Rate   01/26/14 1238 Given   piperacillin-tazobactam (ZOSYN) IVPB 3.375 g 3.375 g 12.5 mL/hr   01/26/14 1941 Given   piperacillin-tazobactam (ZOSYN) IVPB 3.375 g 3.375 g 12.5 mL/hr   01/26/14 2142 Given   vancomycin (VANCOCIN) IVPB 750 mg/150 ml premix 750 mg 150 mL/hr   01/27/14 0334 Given   piperacillin-tazobactam (ZOSYN) IVPB 3.375 g 3.375 g 12.5 mL/hr   01/27/14 1238 Given   piperacillin-tazobactam (ZOSYN) IVPB 3.375 g 3.375 g 12.5 mL/hr   01/27/14 1924 Given   piperacillin-tazobactam (ZOSYN) IVPB 3.375 g 3.375 g 12.5 mL/hr   01/27/14 2204 Given   vancomycin (VANCOCIN) IVPB 750 mg/150 ml premix 750 mg 150 mL/hr   01/28/14 0334 Given  piperacillin-tazobactam (ZOSYN) IVPB 3.375 g 3.375 g 12.5 mL/hr   01/28/14 1226 Given   piperacillin-tazobactam (ZOSYN) IVPB 3.375 g 3.375 g 12.5 mL/hr   01/28/14 2245 Given   piperacillin-tazobactam (ZOSYN) IVPB 3.375 g 3.375 g 12.5 mL/hr   01/28/14 2327 Given   vancomycin (VANCOCIN) IVPB 1000 mg/200 mL premix 1,000 mg 200 mL/hr   01/29/14 0406 Given   piperacillin-tazobactam (ZOSYN) IVPB 3.375 g 3.375 g 12.5 mL/hr      Time spent: 48min    Phillips Climes, MD  [facility] Pager [phone]. If 7PM-7AM, please contact night-coverage at [email or web address], password [facility] 01/29/2014, 10:40 AM  LOS: 7 days

## 2014-01-30 LAB — BASIC METABOLIC PANEL
Anion gap: 16 — ABNORMAL HIGH (ref 5–15)
BUN: 21 mg/dL (ref 6–23)
CHLORIDE: 98 meq/L (ref 96–112)
CO2: 20 meq/L (ref 19–32)
Calcium: 9.5 mg/dL (ref 8.4–10.5)
Creatinine, Ser: 0.61 mg/dL (ref 0.50–1.10)
GFR calc Af Amer: 88 mL/min — ABNORMAL LOW (ref >90)
GFR calc non Af Amer: 76 mL/min — ABNORMAL LOW (ref >90)
Glucose, Bld: 265 mg/dL — ABNORMAL HIGH (ref 70–99)
Potassium: 3.9 mEq/L (ref 3.7–5.3)
SODIUM: 134 meq/L — AB (ref 137–147)

## 2014-01-30 LAB — GLUCOSE, CAPILLARY
GLUCOSE-CAPILLARY: 193 mg/dL — AB (ref 70–99)
GLUCOSE-CAPILLARY: 213 mg/dL — AB (ref 70–99)
Glucose-Capillary: 254 mg/dL — ABNORMAL HIGH (ref 70–99)
Glucose-Capillary: 189 mg/dL — ABNORMAL HIGH (ref 70–99)

## 2014-01-30 LAB — CBC
HEMATOCRIT: 32.4 % — AB (ref 36.0–46.0)
HEMOGLOBIN: 10.8 g/dL — AB (ref 12.0–15.0)
MCH: 31.4 pg (ref 26.0–34.0)
MCHC: 33.3 g/dL (ref 30.0–36.0)
MCV: 94.2 fL (ref 78.0–100.0)
Platelets: 392 10*3/uL (ref 150–400)
RBC: 3.44 MIL/uL — ABNORMAL LOW (ref 3.87–5.11)
RDW: 13 % (ref 11.5–15.5)
WBC: 13.7 10*3/uL — AB (ref 4.0–10.5)

## 2014-01-30 LAB — PROCALCITONIN: Procalcitonin: 0.1 ng/mL

## 2014-01-30 MED ORDER — PREDNISONE 20 MG PO TABS
40.0000 mg | ORAL_TABLET | Freq: Every day | ORAL | Status: DC
  Administered 2014-01-31: 40 mg via ORAL
  Filled 2014-01-30 (×2): qty 2

## 2014-01-30 MED ORDER — METOPROLOL TARTRATE 25 MG PO TABS
25.0000 mg | ORAL_TABLET | Freq: Once | ORAL | Status: AC
  Administered 2014-01-30: 25 mg via ORAL
  Filled 2014-01-30: qty 1

## 2014-01-30 NOTE — Progress Notes (Signed)
CSW met with the family at their request.  Jodi Price, her daughter, states she talked to the attending that reccommended the patient have 24/7 care.  PT also reccommended SNF for rehab.  Patient is agreeable to SNF rehab, but not long term placement.  Patient's daughter states her home health was cut from 80 to 50 and this is being appealed.  Family asked for patient to be faxed out for SNF.    CSW will leave handoff for 1st shift CSW to follow up with FL-2 request as [facility] was having technical problems.  Clinicals added, unable to send FL2 to facilities.  Family wants [facility] or [facility] as preference. Patient may be ready for discharge soon.    LCSW,LCAS [place] ED CSW [phone]    

## 2014-01-30 NOTE — Plan of Care (Signed)
Problem: Phase I Progression Outcomes Goal: Hemodynamically stable Outcome: Progressing Vital signs stable.  Afebrile.

## 2014-01-30 NOTE — Progress Notes (Signed)
[facility] PROGRESS NOTE  Jodi Price IEP:[id number] DOB: [birth year] DOA: 01/22/2014 PCP: Adline Mango, MD   Brief Narrative: 80 yo female with dm2, htn, afib, dementia, just discharged from [facility] 11/20 after admission for pneumonia-  returned the following day with a complaint of shortness of breath.  Pt was found to have pox 80% by EMS, and brought to ED for evaluation. CXR revealed increase in bilateral infiltrates.  She was noted to have a fever of 102.6 the ER and WBC count was mildly elevated at 11.2. While in the hospital previously, the patient was receiving IV Rocephin and azithromycin and was changed to Levaquin but was noted to have a complaint of nausea and therefore Levaquin was changed to Augmentin. It was planned to continue Augmentin and azithromycin to complete 1 week. During this hospital stay patient was started on IV vancomycin, Zosyn, azithromycin, for  HCAP, azithromycin was discontinued on 11/23, patient remains afebrile, leukocytosis has resolved, but she remains hypoxic, requiring oxygen, which is not her baseline, repeat chest x-ray on 01/28/14 showing worsening pneumonia, CT chest without contrast showing significant bilateral lung disease, suspicious for UIP. Patient was seen by pulmonary, she was started on IV steroids.   Assessment/Plan:   HCAP-with increasing pulmonary infiltrates -Started on vancomycin, azithromycin and Zosyn 11/21 on admission-it appears that Azithromycin was discontinued on 11/23, vancomycin and Zosyn and stopped on 01/29/14. - Blood cultures remain negative -Swallow eval completed, on D3 diet -Continue to wean O2 as able-she was previously discharged on 2 L of oxygen -Chest x-ray showing worsening infiltrate, so CT chest was obtained, as discussed with pulmonary on call Dr. Curt Jews is suspicious for UIP, and they recommend IV steroids to be started,  Acute on chronic respiratory failure/interstitial lung disease - Pulmonary were  consulted, suspicion is for interstitial lung disease, possible idiopathic pulmonary fibrosis. -Will transition IV Solu-Medrol to by mouth prednisone. -Avoid nitrofurantoin in the future as it might be the cause of her interstitial lung disease. - Will need to follow-up with pulmonary as an outpatient.  Nausea vomiting and constipation -transient -resumed diet but appetite is poor -KUB reveals a moderate stool burden -laxatives PRN   DM2 -Uncontrolled, continue with SSI -Monitor as on steroids, may need to add long-acting insulin if remains uncontrolled  Hypertension: -stable, continue amlodipine   Hypothyroidism -Cont levothyroxine   chronic atrial fibrillation -continue Lopressor for rate control and aspirin    Code Status: Full Code Family Communication: Discussed with daughter over the phone 01/30/14 Disposition Plan: Home with home care  HPI/Subjective: She has no complaints today , no shortness of breath, no chest pain, no fever, and platelets of mild cough but nonproductive.  Objective: Filed Vitals:   01/30/14 0500  BP: 148/67  Pulse: 55  Temp: 97.9 F (36.6 C)  Resp: 18    Intake/Output Summary (Last 24 hours) at 01/30/14 1046 Last data filed at 01/30/14 0700  Gross per 24 hour  Intake    120 ml  Output    325 ml  Net   -205 ml   Filed Weights   01/28/14 0423 01/29/14 0436 01/30/14 0500  Weight: 54.432 kg (120 lb) 56.7 kg (125 lb) 57.199 kg (126 lb 1.6 oz)    Exam:   General:  AAOx3, frail elderly female, no distress  Cardiovascular: S1S2/RRR  Respiratory: basilar crackles  Abdomen: soft, NT, BS present  Musculoskeletal: no edema c/c   Data Reviewed: Basic Metabolic Panel:  Recent Labs Lab 01/25/14 0415 01/28/14 0344 01/29/14 0300 01/30/14  0258  NA 142 136* 136* 134*  K 5.0 3.5* 4.4 3.9  CL 103 100 100 98  CO2 28 22 25 20   GLUCOSE 127* 123* 242* 265*  BUN 7 12 14 21   CREATININE 0.62 0.69 0.68 0.61  CALCIUM 9.6 9.1 9.2 9.5    Liver Function Tests: No results for input(s): AST, ALT, ALKPHOS, BILITOT, PROT, ALBUMIN in the last 168 hours. No results for input(s): LIPASE, AMYLASE in the last 168 hours. No results for input(s): AMMONIA in the last 168 hours. CBC:  Recent Labs Lab 01/25/14 0415 01/28/14 0344 01/29/14 0300 01/30/14 0258  WBC 7.4 7.1 4.8 13.7*  HGB 11.3* 10.3* 10.6* 10.8*  HCT 33.8* 31.6* 32.0* 32.4*  MCV 97.4 95.5 94.1 94.2  PLT 293 312 322 392   Cardiac Enzymes: No results for input(s): CKTOTAL, CKMB, CKMBINDEX, TROPONINI in the last 168 hours. BNP (last 3 results)  Recent Labs  01/19/14 0850 01/22/14 2052 01/29/14 0300  PROBNP 889.5* 1026.0* 1260.0*   CBG:  Recent Labs Lab 01/29/14 0621 01/29/14 1133 01/29/14 1701 01/29/14 2050 01/30/14 0622  GLUCAP 226* 213* 174* 230* 254*    Recent Results (from the past 240 hour(s))  Blood culture (routine x 2)     Status: None   Collection Time: 01/22/14 10:16 PM  Result Value Ref Range Status   Specimen Description BLOOD RIGHT FOREARM  Final   Special Requests BOTTLES DRAWN AEROBIC AND ANAEROBIC 10CC EA  Final   Culture  Setup Time   Final    01/23/2014 12:25 Performed at [facility]    Culture   Final    NO GROWTH 5 DAYS Performed at [facility]    Report Status 01/29/2014 FINAL  Final  Blood culture (routine x 2)     Status: None   Collection Time: 01/22/14 10:17 PM  Result Value Ref Range Status   Specimen Description BLOOD LEFT FOREARM  Final   Special Requests BOTTLES DRAWN AEROBIC AND ANAEROBIC 5.5CC EA  Final   Culture  Setup Time   Final    01/23/2014 11:51 Performed at [place]   Final    NO GROWTH 5 DAYS Performed at [facility]    Report Status 01/29/2014 FINAL  Final     Studies: Ct Chest Wo Contrast  01/28/2014   CLINICAL DATA:  Dyspnea/ shortness of breath with fever.  EXAM: CT CHEST WITHOUT CONTRAST  TECHNIQUE: Multidetector CT imaging of the  chest was performed following the standard protocol without IV contrast.  COMPARISON:  Chest x-ray 09/02/2012, 01/18/2014 and 01/28/2014 as well as previous chest CT 09/10/2012  FINDINGS: The lungs are adequately inflated and demonstrate evidence of chronic peripheral interstitial disease/honeycombing compatible with fibrosis. Mild bibasilar bronchiectatic change. There are new patchy areas of bilateral airspace opacification/consolidation likely representing superimposed acute infectious or inflammatory process. No evidence of effusion.  There is mild cardiomegaly. There is mild calcified plaque over the coronary arteries. Mild ectasia of the ascending thoracic aorta measuring 3.8 cm in AP diameter without significant change. There is calcified clip plaque over the thoracic aorta. There is mild mediastinal adenopathy with adjacent precarinal lymph nodes measuring 1.3 cm in short axis should 1.5 cm subcarinal lymph node. There is an air-fluid level over the mid to distal esophagus.  Images through the upper abdomen demonstrate a few small nonspecific lymph nodes adjacent the gastroesophageal junction unchanged. The there is a simple cyst over the upper pole of the right  kidney unchanged. Multiple thoracic spine compression fractures are unchanged.  IMPRESSION: Evidence of chronic pulmonary fibrosis with new patchy bilateral airspace opacification. Findings likely due to a superimposed acute infectious/ inflammatory process. Mild reactive mediastinal adenopathy.  Air-fluid level over the mid to distal esophagus. Findings may be due to dysmotility, reflux and less likely a partially obstructing process.  Mild cardiomegaly with 3 vessel atherosclerotic coronary artery disease.  Mild ectasia of the ascending thoracic aorta unchanged. Ectatic to mildly aneurysmal ascending thoracic aorta. Recommend annual imaging followup by CTA or MRA. This recommendation follows 2010 ACCF/AHA/AATS/ACR/ASA/SCA/SCAI/SIR/STS/SVM  Guidelines for the Diagnosis and Management of Patients With Thoracic Aortic Disease. Circulation.2010; 121: K812-X517  Simple right renal cyst unchanged.  Multiple stable thoracic spine compression fractures.   Electronically Signed   By: Marin Olp M.D.   On: 01/28/2014 14:28    Scheduled Meds: . amLODipine  5 mg Oral Daily  . aspirin  81 mg Oral Daily  . dextromethorphan-guaiFENesin  1 tablet Oral BID  . enoxaparin (LOVENOX) injection  40 mg Subcutaneous Q24H  . FLUoxetine  20 mg Oral Daily  . insulin aspart  0-9 Units Subcutaneous TID WC  . levothyroxine  50 mcg Oral QAC breakfast  . metoprolol  50 mg Oral BID  . pantoprazole  40 mg Oral Daily  . polyethylene glycol  17 g Oral BID  . potassium chloride  10 mEq Oral Daily  . [START ON 01/31/2014] predniSONE  40 mg Oral Q breakfast   Continuous Infusions:   Antibiotics Given (last 72 hours)    Date/Time Action Medication Dose Rate   01/27/14 1238 Given   piperacillin-tazobactam (ZOSYN) IVPB 3.375 g 3.375 g 12.5 mL/hr   01/27/14 1924 Given   piperacillin-tazobactam (ZOSYN) IVPB 3.375 g 3.375 g 12.5 mL/hr   01/27/14 2204 Given   vancomycin (VANCOCIN) IVPB 750 mg/150 ml premix 750 mg 150 mL/hr   01/28/14 0334 Given   piperacillin-tazobactam (ZOSYN) IVPB 3.375 g 3.375 g 12.5 mL/hr   01/28/14 1226 Given   piperacillin-tazobactam (ZOSYN) IVPB 3.375 g 3.375 g 12.5 mL/hr   01/28/14 2245 Given   piperacillin-tazobactam (ZOSYN) IVPB 3.375 g 3.375 g 12.5 mL/hr   01/28/14 2327 Given   vancomycin (VANCOCIN) IVPB 1000 mg/200 mL premix 1,000 mg 200 mL/hr   01/29/14 0406 Given   piperacillin-tazobactam (ZOSYN) IVPB 3.375 g 3.375 g 12.5 mL/hr      Time spent: 70min    Phillips Climes, MD  [facility] Pager [phone]. If 7PM-7AM, please contact night-coverage at [email or web address], password [email or web address] 01/30/2014, 10:46 AM  LOS: 8 days

## 2014-01-31 LAB — GLUCOSE, CAPILLARY
Glucose-Capillary: 193 mg/dL — ABNORMAL HIGH (ref 70–99)
Glucose-Capillary: 222 mg/dL — ABNORMAL HIGH (ref 70–99)

## 2014-01-31 LAB — ANTI-NUCLEAR AB-TITER (ANA TITER): ANA Titer 1: 1:160 {titer} — ABNORMAL HIGH

## 2014-01-31 LAB — ANTI-SCLERODERMA ANTIBODY: Scleroderma (Scl-70) (ENA) Antibody, IgG: <1

## 2014-01-31 LAB — ANTI-DNA ANTIBODY, DOUBLE-STRANDED: DS DNA AB: 1 [IU]/mL

## 2014-01-31 LAB — ANA: Anti Nuclear Antibody(ANA): POSITIVE — AB

## 2014-01-31 MED ORDER — PREDNISONE (PAK) 10 MG PO TABS: ORAL_TABLET | Freq: Every day | ORAL | Status: DC

## 2014-01-31 MED ORDER — INSULIN ASPART 100 UNIT/ML ~~LOC~~ SOLN: 0.0000 [IU] | Freq: Three times a day (TID) | SUBCUTANEOUS | Status: DC

## 2014-01-31 NOTE — Discharge Summary (Signed)
Jodi Price, 80 y.o., DOB [birth year], MRN [id number]. Admission date: 01/22/2014 Discharge Date 01/31/2014 Primary MD Adline Mango, MD Admitting Physician Jani Gravel, MD  Admission Diagnosis  Shortness of breath [R06.02] HCAP (healthcare-associated pneumonia) [J18.9] Fever, unspecified fever cause [R50.9]  Please follow these as an outpatient: - Continue to be on oxygen on discharge to clear his nasal cannula, or as needed to keep saturation more than 90%. - NO NITRAFURANTOIN EVER , as it might be the cause of her lung fibrosis -Follow CBC/BMP in 3 days. -Follow recommendation from pulmonary regarding steroid taper after her appointment on 02/10/14.  Discharge Diagnosis   Active Problems:   DM (diabetes mellitus)   HTN (hypertension)   Hypothyroidism   Chronic a-fib   Pneumonia   Healthcare-associated pneumonia   HCAP (healthcare-associated pneumonia)   Shortness of breath   ILD (interstitial lung disease)   Acute on chronic respiratory failure      Past Medical History  Diagnosis Date  . Hypertension   . Thyroid disease   . Acid reflux disease   . Depression   . Diabetes mellitus   . Urinary tract infection   . Hypothyroidism   . Blood transfusion     hx reaction  . GERD (gastroesophageal reflux disease)     gastrophoresis  . H/O hiatal hernia   . Cancer   . Arthritis   . Pneumonia   . Atrial fibrillation   . Chronic kidney disease   . Dementia   . Hyperlipidemia   . Peripheral neuropathy   . Umbilical hernia     Past Surgical History  Procedure Laterality Date  . Breast surgery    . Back surgery      1950  . Eye surgery      cataract  . Joint replacement      bilateral knees     Hospital Course See H&P, Labs, Consult and Test reports for all details in brief, patient was admitted for **  Active Problems:   DM (diabetes mellitus)   HTN (hypertension)   Hypothyroidism   Chronic a-fib   Pneumonia   Healthcare-associated pneumonia   HCAP  (healthcare-associated pneumonia)   Shortness of breath   ILD (interstitial lung disease)   Acute on chronic respiratory failure   admission history of present illness/brief narrative:  80 yo female with dm2, htn, afib, dementia, just discharged from [facility] 11/20 after admission for pneumonia- returned the following day with a complaint of shortness of breath. Pt was found to have pox 80% by EMS, and brought to ED for evaluation. CXR revealed increase in bilateral infiltrates.  She was noted to have a fever of 102.6 the ER and WBC count was mildly elevated at 11.2. While in the hospital previously, the patient was receiving IV Rocephin and azithromycin and was changed to Levaquin but was noted to have a complaint of nausea and therefore Levaquin was changed to Augmentin. It was planned to continue Augmentin and azithromycin to complete 1 week. During this hospital stay patient was started on IV vancomycin, Zosyn, azithromycin, for HCAP, azithromycin was discontinued on 11/23, patient remains afebrile, leukocytosis has resolved, but she remains hypoxic, requiring oxygen, which is not her baseline, repeat chest x-ray on 01/28/14 showing worsening pneumonia, CT chest without contrast showing significant bilateral lung disease, suspicious for UIP. Patient was seen by pulmonary, she was started on IV steroids. For suspicion for IPF, possible flare related to nitrofurantoin use, patient was switched to oral prednisone, with long-term tapering dose, to  be followed with pulmonary as an outpatient on 02/10/14 at 10:30 AM, and with recommendation for NO NITRAFURANTOIN EVER .  HCAP-with increasing pulmonary infiltrates -Started on vancomycin, azithromycin and Zosyn 11/21 on admission-it appears that Azithromycin was discontinued on 11/23, vancomycin and Zosyn and stopped on 01/29/14. - Blood cultures remain negative -Swallow eval completed, on D3 diet -Continue to wean O2 as able-she was previously  discharged on 2 L of oxygen  Acute on chronic respiratory failure/interstitial lung disease - Pulmonary were consulted, suspicion is for interstitial lung disease, possible idiopathic pulmonary fibrosis. -Will transition IV Solu-Medrol to by mouth prednisone on discharge, 30 mg oral 1 week, then decrease to 50 mg oral daily until she is followed by pulmonary. -Avoid nitrofurantoin in the future as it might be the cause of her interstitial lung disease. - Will need to follow-up with pulmonary as an outpatient.  Nausea vomiting and constipation -transient -resumed diet but appetite is poor -KUB reveals a moderate stool burden -laxatives PRN  DM2 - continue with SSI -Monitor as on steroids, may need to add long-acting insulin if remains uncontrolled  Hypertension: -stable, continue amlodipine  Hypothyroidism -Cont levothyroxine  chronic atrial fibrillation -continue Lopressor for rate control and aspirin     Consults    pulmonary  CODE STATUS DO NOT RESUSCITATE  Significant Tests:  See full reports for all details    Dg Chest 2 View  01/19/2014   CLINICAL DATA:  Short of breath. Weakness. Patient admitted yesterday for shortness of breath and abdominal pain.  EXAM: CHEST  2 VIEW  COMPARISON:  01/18/2014  FINDINGS: Cardiac silhouette is normal in size. No mediastinal or hilar masses or convincing adenopathy.  There is hazy airspace opacity in the left mid and lower lung. There are bilateral coarse reticular opacities, primarily peripherally, and heterogeneous distribution, similar to prior exams. Small focus of airspace opacity is noted in the right mid lung peripherally.  No pleural effusion.  No pneumothorax.  Bony thorax is demineralized. There are compression deformities of the thoracic spine, stable.  There are changes from previous right breast surgery, stable.  IMPRESSION: 1. Area of airspace opacity in the left mid to lower lung is stable from the previous day's exam.  This may reflect pneumonia. The findings could be due to progression of interstitial fibrosis. 2. Areas of coarse reticular opacity in the lungs are most suggestive of interstitial fibrosis. 3. No evidence of pulmonary edema.   Electronically Signed   By: Lajean Manes M.D.   On: 01/19/2014 10:13   Dg Chest 2 View  01/18/2014   CLINICAL DATA:  New occurrence of shortness of breath. History of bilateral mastectomies.  EXAM: CHEST  2 VIEW  COMPARISON:  09/02/2012 and chest CT 09/10/2012  FINDINGS: Two views of the chest demonstrate chronic lung changes with peripheral reticular densities. There are new airspace densities in the left mid and lower lung region. Heart size is stable. Atherosclerotic calcifications at the aortic arch. Negative for a pneumothorax. Chronic compression deformities in the thoracic spine. No definite pleural effusions.  IMPRESSION: New airspace densities in the left mid and lower lung region. Findings are concerning for pneumonia. Consider follow-up to ensure resolution.  Chronic lung changes are suggestive for fibrosis.   Electronically Signed   By: Markus Daft M.D.   On: 01/18/2014 12:42   Dg Abd 1 View  01/20/2014   CLINICAL DATA:  Abdominal pain  EXAM: ABDOMEN - 1 VIEW  COMPARISON:  08/26/2012  FINDINGS: There is a  moderate stool burden identified throughout the colon. Mild gaseous distension of the stomach is noted. There is no dilated small bowel loops. Calcified fibroid is identified within the right side of pelvis.  IMPRESSION: Moderate stool burden within the colon which may be indicative of constipation.   Electronically Signed   By: Kerby Moors M.D.   On: 01/20/2014 17:10   Ct Chest Wo Contrast  01/28/2014   CLINICAL DATA:  Dyspnea/ shortness of breath with fever.  EXAM: CT CHEST WITHOUT CONTRAST  TECHNIQUE: Multidetector CT imaging of the chest was performed following the standard protocol without IV contrast.  COMPARISON:  Chest x-ray 09/02/2012, 01/18/2014 and  01/28/2014 as well as previous chest CT 09/10/2012  FINDINGS: The lungs are adequately inflated and demonstrate evidence of chronic peripheral interstitial disease/honeycombing compatible with fibrosis. Mild bibasilar bronchiectatic change. There are new patchy areas of bilateral airspace opacification/consolidation likely representing superimposed acute infectious or inflammatory process. No evidence of effusion.  There is mild cardiomegaly. There is mild calcified plaque over the coronary arteries. Mild ectasia of the ascending thoracic aorta measuring 3.8 cm in AP diameter without significant change. There is calcified clip plaque over the thoracic aorta. There is mild mediastinal adenopathy with adjacent precarinal lymph nodes measuring 1.3 cm in short axis should 1.5 cm subcarinal lymph node. There is an air-fluid level over the mid to distal esophagus.  Images through the upper abdomen demonstrate a few small nonspecific lymph nodes adjacent the gastroesophageal junction unchanged. The there is a simple cyst over the upper pole of the right kidney unchanged. Multiple thoracic spine compression fractures are unchanged.  IMPRESSION: Evidence of chronic pulmonary fibrosis with new patchy bilateral airspace opacification. Findings likely due to a superimposed acute infectious/ inflammatory process. Mild reactive mediastinal adenopathy.  Air-fluid level over the mid to distal esophagus. Findings may be due to dysmotility, reflux and less likely a partially obstructing process.  Mild cardiomegaly with 3 vessel atherosclerotic coronary artery disease.  Mild ectasia of the ascending thoracic aorta unchanged. Ectatic to mildly aneurysmal ascending thoracic aorta. Recommend annual imaging followup by CTA or MRA. This recommendation follows 2010 ACCF/AHA/AATS/ACR/ASA/SCA/SCAI/SIR/STS/SVM Guidelines for the Diagnosis and Management of Patients With Thoracic Aortic Disease. Circulation.2010; 121: R975-O832  Simple right  renal cyst unchanged.  Multiple stable thoracic spine compression fractures.   Electronically Signed   By: Marin Olp M.D.   On: 01/28/2014 14:28   Dg Chest Port 1 View  01/28/2014   CLINICAL DATA:  Acute respiratory failure  EXAM: PORTABLE CHEST - 1 VIEW  COMPARISON:  01/22/2014; 01/19/2014; 01/18/2014 09/02/2012; chest CT - 09/10/2012  FINDINGS: Grossly unchanged enlarged cardiac silhouette and mediastinal contours given persistently reduced lung volumes. Atherosclerotic plaque within the thoracic aorta. Pulmonary vasculature appears less distinct than present examination with cephalization of flow. Slight worsening of bilateral mid and lower lung heterogeneous/consolidative airspace opacities. No definite pleural effusion or pneumothorax. Unchanged bones. Surgical clips overlie the right axilla.  IMPRESSION: Findings suggestive of worsening pulmonary edema superimposed on unchanged to slight worsening multi focal infection and/or ARDS.   Electronically Signed   By: Sandi Mariscal M.D.   On: 01/28/2014 08:05   Dg Chest Port 1 View  01/22/2014   CLINICAL DATA:  Shortness of breath with chest pain and cough for 2 weeks. History of hypertension and diabetes.  EXAM: PORTABLE CHEST - 1 VIEW  COMPARISON:  Radiographs 01/19/2014 and 01/18/2014.  CT 09/10/2012.  FINDINGS: 2114 hr. Compared with the recent radiographs, there is significantly lower lung volumes  with progressive interstitial prominence and new right-greater-than-left basilar airspace opacities. Prior studies have demonstrated underlying pulmonary fibrosis. There is no significant pleural effusion. The heart size and mediastinal contours are stable. Surgical clips are present within the right axilla.  IMPRESSION: Increased interstitial prominence with new right-greater-than-left basilar airspace opacities compared with recent prior studies. Findings suggest edema or pneumonia superimposed on pulmonary fibrosis.   Electronically Signed   By: Camie Patience M.D.   On: 01/22/2014 21:30     Today   Subjective:   Adalis Gatti today has no headache,no chest abdominal pain,no new weakness tingling or numbness, feels much better today.  Objective:   Blood pressure 147/68, pulse 61, temperature 97.8 F (36.6 C), temperature source Oral, resp. rate 18, height 4\' 11"  (1.499 m), weight 57.289 kg (126 lb 4.8 oz), SpO2 95 %.  Intake/Output Summary (Last 24 hours) at 01/31/14 1235 Last data filed at 01/31/14 0950  Gross per 24 hour  Intake    340 ml  Output    400 ml  Net    -60 ml    Exam Awake Alert, Oriented *3, No new F.N deficits, Normal affect [place].AT,PERRAL Supple Neck,No JVD, No cervical lymphadenopathy appriciated.  Symmetrical Chest wall movement, Good air movement bilaterally, scattered bilateral Rales mainly at the bases. RRR,No Gallops,Rubs or new Murmurs, No Parasternal Heave +ve B.Sounds, Abd Soft, Non tender, No organomegaly appriciated, No rebound -guarding or rigidity. No Cyanosis, Clubbing or edema, No new Rash or bruise  Data Review     CBC w Diff:  Lab Results  Component Value Date   WBC 13.7* 01/30/2014   HGB 10.8* 01/30/2014   HCT 32.4* 01/30/2014   PLT 392 01/30/2014   LYMPHOPCT 8* 01/23/2014   MONOPCT 4 01/23/2014   EOSPCT 1 01/23/2014   BASOPCT 0 01/23/2014   CMP:  Lab Results  Component Value Date   NA 134* 01/30/2014   K 3.9 01/30/2014   CL 98 01/30/2014   CO2 20 01/30/2014   BUN 21 01/30/2014   CREATININE 0.61 01/30/2014   PROT 6.3 01/23/2014   ALBUMIN 2.1* 01/23/2014   BILITOT 0.6 01/23/2014   ALKPHOS 46 01/23/2014   AST 19 01/23/2014   ALT 8 01/23/2014  .  Micro Results Recent Results (from the past 240 hour(s))  Blood culture (routine x 2)     Status: None   Collection Time: 01/22/14 10:16 PM  Result Value Ref Range Status   Specimen Description BLOOD RIGHT FOREARM  Final   Special Requests BOTTLES DRAWN AEROBIC AND ANAEROBIC 10CC EA  Final   Culture  Setup Time   Final     01/23/2014 12:25 Performed at [facility]    Culture   Final    NO GROWTH 5 DAYS Performed at [facility]    Report Status 01/29/2014 FINAL  Final  Blood culture (routine x 2)     Status: None   Collection Time: 01/22/14 10:17 PM  Result Value Ref Range Status   Specimen Description BLOOD LEFT FOREARM  Final   Special Requests BOTTLES DRAWN AEROBIC AND ANAEROBIC 5.5CC EA  Final   Culture  Setup Time   Final    01/23/2014 11:51 Performed at [facility]   Final    NO GROWTH 5 DAYS Performed at [facility]    Report Status 01/29/2014 FINAL  Final     Discharge Instructions          Follow-up Information  Follow up with [facility].   Why:  Registered Nurse, Physical Therapy Services to start within 24-48 hours of discharge.    Contact information:   [address] [place] [address] [phone]       Follow up with [facility].   Why:  Nebulizer to be delivered to patient's room prior to discharge   Contact information:   [address] [place] [address] [phone]       Follow up with [facility], NP On 02/10/2014.   Specialty:  Nurse Practitioner   Why:  Tammy PArret ANP at 10:30 an. Will need to see Dr. Chase Caller in future.   Contact information:   [address] [place] [address] [phone]       Follow up with Adline Mango, MD. Call in 2 weeks.   Specialty:  Internal Medicine      Discharge Medications     Medication List    STOP taking these medications        amoxicillin-clavulanate 875-125 MG per tablet  Commonly known as:  AUGMENTIN     azithromycin 250 MG tablet  Commonly known as:  ZITHROMAX     BIOTIN PO     dextromethorphan-guaiFENesin 30-600 MG per 12 hr tablet  Commonly known as:  MUCINEX DM     nitrofurantoin (macrocrystal-monohydrate) 100 MG capsule  Commonly known as:  MACROBID      TAKE these medications         albuterol 108 (90 BASE) MCG/ACT inhaler  Commonly known as:  PROVENTIL HFA;VENTOLIN HFA  Inhale 2 puffs into the lungs every 6 (six) hours as needed for wheezing or shortness of breath. Use 2 puffs 3 times daily x 5 days then 2 puffs every 6 hours as needed.     amLODipine 5 MG tablet  Commonly known as:  NORVASC  Take 5 mg by mouth daily.     aspirin 81 MG tablet  Take 81 mg by mouth daily.     feeding supplement (GLUCERNA SHAKE) Liqd  Take 237 mLs by mouth daily.     FLUoxetine 20 MG capsule  Commonly known as:  PROZAC  Take 20 mg by mouth daily.     insulin aspart 100 UNIT/ML injection  Commonly known as:  novoLOG  Inject 0-9 Units into the skin 3 (three) times daily with meals.     levothyroxine 50 MCG tablet  Commonly known as:  SYNTHROID, LEVOTHROID  Take 50 mcg by mouth daily.     metoprolol 50 MG tablet  Commonly known as:  LOPRESSOR  Take 50 mg by mouth daily.     omeprazole 40 MG capsule  Commonly known as:  PRILOSEC  Take 40 mg by mouth daily.     polyethylene glycol packet  Commonly known as:  MIRALAX / GLYCOLAX  Take 17 g by mouth 2 (two) times daily.     potassium chloride 10 MEQ tablet  Commonly known as:  K-DUR  Take 10 mEq by mouth daily.     predniSONE 10 MG tablet  Commonly known as:  STERAPRED UNI-PAK  - Take by mouth daily. Please use 40 mg oral daily X 1 week from 02/01/14 to 02/07/14 then decreased to  - 30 mg oral daily  from 02/08/14 until you are seen by pulmonary on your appointment and follow their recommendation     senna-docusate 8.6-50 MG per tablet  Commonly known as:  SENOKOT S  Take 1  tablet by mouth at bedtime.     traMADol 50 MG tablet  Commonly known as:  ULTRAM  Take 50 mg by mouth every 6 (six) hours as needed for moderate pain.         Total Time in preparing paper work, data evaluation and todays exam - 35 minutes  ELGERGAWY, DAWOOD M.D on 01/31/2014 at 12:35 PM  [facility] Office  [phone]

## 2014-01-31 NOTE — Plan of Care (Signed)
Problem: Acute Rehab PT Goals(only PT should resolve) Goal: Pt Will Ambulate Outcome: Completed/Met Date Met:  01/31/14

## 2014-01-31 NOTE — Plan of Care (Signed)
Problem: Phase III Progression Outcomes Goal: Discharge plan remains appropriate-arrangements made Outcome: Completed/Met Date Met:  01/31/14

## 2014-01-31 NOTE — Plan of Care (Signed)
Problem: Phase III Progression Outcomes Goal: O2 sats > or equal to 93% on room air Outcome: Completed/Met Date Met:  01/31/14 Goal: Activity at appropriate level-compared to baseline (UP IN CHAIR FOR HEMODIALYSIS)  Outcome: Completed/Met Date Met:  01/31/14  Problem: Discharge Progression Outcomes Goal: Barriers To Progression Addressed/Resolved Outcome: Progressing Goal: Discharge plan in place and appropriate Outcome: Completed/Met Date Met:  01/31/14 Goal: Pain controlled with appropriate interventions Outcome: Completed/Met Date Met:  01/31/14 Goal: Hemodynamically stable Outcome: Completed/Met Date Met:  72/25/75 Goal: Complications resolved/controlled Outcome: Not Applicable Date Met:  07/20/31 Goal: Tolerating diet Outcome: Completed/Met Date Met:  01/31/14 Goal: Activity appropriate for discharge plan Outcome: Completed/Met Date Met:  01/31/14 Goal: Vaccine documented on D/C instructions Outcome: Not Applicable Date Met:  58/25/18 Goal: Other Discharge Outcomes/Goals Outcome: Not Applicable Date Met:  98/42/10

## 2014-01-31 NOTE — Progress Notes (Signed)
Physical Therapy Treatment Patient Details Name: Jodi Price MRN: [id number] DOB: 08/29/1919 Today's Date: 01/31/2014    History of Present Illness 80 yo female admitted HCAP. Discharged from [facility] with same one day prior. Hx of HTN and Afib.    PT Comments    Patient continues to progress slowly towards physical therapy goals. Very motivated to work with therapy and improve her functional independence however, limited by fatigue today with SpO2 dropping to 85% while on 4L supplemental O2 while ambulating. Discharge recommendation updated to SNF as patient's family was unable to arrange supervision at her independent living facility. Patient will continue to greatly benefit from skilled physical therapy services at SNF to further improve independence with functional mobility.   Follow Up Recommendations  SNF     Equipment Recommendations  None recommended by PT    Recommendations for Other Services       Precautions / Restrictions Precautions Precautions: Fall Precaution Comments: monitor sats/O2 Restrictions Weight Bearing Restrictions: No    Mobility  Bed Mobility                  Transfers Overall transfer level: Needs assistance Equipment used: Rolling walker (2 wheeled) Transfers: Sit to/from Stand Sit to Stand: Supervision         General transfer comment: Supervision for safety from recliner x2 and BSC. Pt required 2 attempts to stand initially from recliner.  Ambulation/Gait Ambulation/Gait assistance: Supervision Ambulation Distance (Feet): 100 Feet (additional bout of 10 feet to [facility]) Assistive device: Rolling walker (2 wheeled) Gait Pattern/deviations: Step-through pattern;Decreased stride length;Trunk flexed   Gait velocity interpretation: Below normal speed for age/gender General Gait Details: VC for walker placement within base of support. Pt very fatigued after 50 feet of ambulation requiring standing rest break. SpO2 dropped to 85%  on 4L supplemental O2 by the time she returned to room to have a seat.  Cues for pursed lip breathing throughout distance.   Stairs            Wheelchair Mobility    Modified Rankin (Stroke Patients Only)       Balance                                    Cognition Arousal/Alertness: Awake/alert Behavior During Therapy: WFL for tasks assessed/performed Overall Cognitive Status: Within Functional Limits for tasks assessed                      Exercises Other Exercises Other Exercises: standing balance activity with single UE support x 3 minutes.    General Comments General comments (skin integrity, edema, etc.): Urinary incontinence upon standing. Gown changed. RN notified.      Pertinent Vitals/Pain Pain Assessment: No/denies pain    Home Living                      Prior Function            PT Goals (current goals can now be found in the care plan section) Acute Rehab PT Goals PT Goal Formulation: With patient Time For Goal Achievement: 02/08/14 Potential to Achieve Goals: Good Progress towards PT goals: Progressing toward goals    Frequency  Min 3X/week    PT Plan Discharge plan needs to be updated    Co-evaluation             End of Session  Equipment Utilized During Treatment: Gait belt;Oxygen Activity Tolerance: Patient limited by fatigue Patient left: in chair;with call bell/phone within reach;with chair alarm set;with nursing/sitter in room     Time: 1120-1151 PT Time Calculation (min) (ACUTE ONLY): 31 min  Charges:  $Gait Training: 8-22 mins $Therapeutic Activity: 8-22 mins                    G Codes:      Ellouise Newer February 19, 2014, 12:03 PM  Camille Bal [place], [place]

## 2014-01-31 NOTE — Discharge Instructions (Signed)
Follow with Primary MD Adline Mango, MD in 7 days , or skilled nursing facility physician.  Get CBC, CMP, 2 view Chest X ray checked  by Primary MD next visit.   - NO NITRAFURANTOIN EVER  Activity: As tolerated with Full fall precautions use walker/cane & assistance as needed   Disposition SNF   Diet: Heart Healthy , with feeding assistance and aspiration precautions as needed.  For Heart failure patients - Check your Weight same time everyday, if you gain over 2 pounds, or you develop in leg swelling, experience more shortness of breath or chest pain, call your Primary MD immediately. Follow Cardiac Low Salt Diet and 1.8 lit/day fluid restriction.   On your next visit with your primary care physician please Get Medicines reviewed and adjusted.   Please request your Prim.MD to go over all Hospital Tests and Procedure/Radiological results at the follow up, please get all Hospital records sent to your Prim MD by signing hospital release before you go home.   If you experience worsening of your admission symptoms, develop shortness of breath, life threatening emergency, suicidal or homicidal thoughts you must seek medical attention immediately by calling 911 or calling your MD immediately  if symptoms less severe.  You Must read complete instructions/literature along with all the possible adverse reactions/side effects for all the Medicines you take and that have been prescribed to you. Take any new Medicines after you have completely understood and accpet all the possible adverse reactions/side effects.   Do not drive, operating heavy machinery, perform activities at heights, swimming or participation in water activities or provide baby sitting services if your were admitted for syncope or siezures until you have seen by Primary MD or a Neurologist and advised to do so again.  Do not drive when taking Pain medications.    Do not take more than prescribed Pain,  Sleep and Anxiety Medications  Special Instructions: If you have smoked or chewed Tobacco  in the last 2 yrs please stop smoking, stop any regular Alcohol  and or any Recreational drug use.  Wear Seat belts while driving.   Please note  You were cared for by a hospitalist during your hospital stay. If you have any questions about your discharge medications or the care you received while you were in the hospital after you are discharged, you can call the unit and asked to speak with the hospitalist on call if the hospitalist that took care of you is not available. Once you are discharged, your primary care physician will handle any further medical issues. Please note that NO REFILLS for any discharge medications will be authorized once you are discharged, as it is imperative that you return to your primary care physician (or establish a relationship with a primary care physician if you do not have one) for your aftercare needs so that they can reassess your need for medications and monitor your lab values.

## 2014-01-31 NOTE — Progress Notes (Signed)
CSW (Clinical Social Worker) prepared pt dc packet and placed with shadow chart. CSW arranged non-emergent ambulance transport. Pt, pt family, pt nurse, and facility informed. CSW signing off.  Taitum Alms, LCSWA [phone]  

## 2014-01-31 NOTE — Clinical Social Work Placement (Addendum)
    Clinical Social Work Department CLINICAL SOCIAL WORK PLACEMENT NOTE 01/31/2014  Patient:  Jodi Price, Jodi Price  Account Number:  [id number] Admit date:  01/22/2014  Clinical Social Worker:  Adair Laundry  Date/time:  01/31/2014 10:43 AM  Clinical Social Work is seeking post-discharge placement for this patient at the following level of care:   [place]   (*CSW will update this form in Epic as items are completed)   01/31/2014  Patient/family provided with [facility] Department of Clinical Social Work's list of facilities offering this level of care within the geographic area requested by the patient (or if unable, by the patient's family).  01/31/2014  Patient/family informed of their freedom to choose among providers that offer the needed level of care, that participate in Medicare, Medicaid or managed care program needed by the patient, have an available bed and are willing to accept the patient.  01/31/2014  Patient/family informed of [facility]' ownership interest in [facility] as well as of the fact that they are under no obligation to receive care at this facility.  PASARR submitted to EDS on Existing PASARR number received on   FL2 transmitted to all facilities in geographic area requested by pt/family on  01/31/2014 FL2 transmitted to all facilities within larger geographic area on   Patient informed that his/her managed care company has contracts with or will negotiate with  certain facilities, including the following:     Patient/family informed of bed offers received:  01/31/2014 Patient chooses bed at [facility] Physician recommends and patient chooses bed at    Patient to be transferred to [facility] on  01/31/2014 Patient to be transferred to facility by [facility] Patient and family notified of transfer on 01/31/2014 Name of family member notified: Rosalie Gums   The following physician request were entered in Epic: Physician Request   Please sign FL2.    Additional CommentsBerton Mount, [place]

## 2014-01-31 NOTE — Progress Notes (Cosign Needed)
PULMONARY / CRITICAL CARE MEDICINE   Name: Jodi Price MRN: [id number] DOB: April 11, 1919    ADMISSION DATE:  01/22/2014 CONSULTATION DATE:  01/28/14  REFERRING MD :  Lucia Bitter   CHIEF COMPLAINT:  Abnormal CT chest  SIGNIFICANT EVENTS: 01/22/2014 - admit 01/28/2014 - pccm consult    HISTORY OF PRESENT ILLNESS:   80 year old female with multiple medical problems including dementia not otherwise specified, hiatal hernia, gastroesophageal reflux disease, arthritis presumably osteoarthritis without any history of collagen vascular disease.Marland Kitchen She was admitted at [facility] long hospital between 01/18/2014 and 01/21/2014 for a diagnosis of pneumonia not otherwise specified but readmitted on 01/22/2014 to [facility] with fever of 102.6, elevated white count of 11,200 and desaturating to 80%. A diagnosis of age Was made and she has been treated with extensive antibiotic therapy but a chest x-ray on 01/28/2014 today showed worsening pneumonia. Therefore CT scan of the chest was obtained which showed findings of interstitial lung disease and therefore pulmonary has been consulted.          SUBJECTIVE:   VITAL SIGNS: Temp:  [97.6 F (36.4 C)-97.8 F (36.6 C)] 97.8 F (36.6 C) (11/30 0551) Pulse Rate:  [50-61] 61 (11/30 0551) Resp:  [18] 18 (11/30 0551) BP: (126-147)/(54-69) 147/68 mmHg (11/30 0551) SpO2:  [95 %-100 %] 95 % (11/30 0551) Weight:  [126 lb 4.8 oz (57.289 kg)] 126 lb 4.8 oz (57.289 kg) (11/30 0551) HEMODYNAMICS:   VENTILATOR SETTINGS:   INTAKE / OUTPUT:  Intake/Output Summary (Last 24 hours) at 01/31/14 0857 Last data filed at 01/31/14 0606  Gross per 24 hour  Intake    460 ml  Output    400 ml  Net     60 ml    PHYSICAL EXAMINATION: General:  Frail pleasant female. Sitting up in no distress. Neuro:  Alert, pleasant cheerful. Speech is normal. Oriented 2, hx of dementia HEENT:  Neck is supple no neck nodes Cardiovascular:  Normal heart  sounds Lungs:  Normal work of breathing but mild diffuse crackles noted Abdomen:  Soft, nontender no organomegaly. ++ response to miralax.  Musculoskeletal:  No cyanosis no clubbing no pedal edema but evidence of osteoarthritis present Skin:  Is intact without any evidence of scleroderma  LABS: PULMONARY No results for input(s): PHART, PCO2ART, PO2ART, HCO3, TCO2, O2SAT in the last 168 hours.  Invalid input(s): PCO2, PO2  CBC  Recent Labs Lab 01/28/14 0344 01/29/14 0300 01/30/14 0258  HGB 10.3* 10.6* 10.8*  HCT 31.6* 32.0* 32.4*  WBC 7.1 4.8 13.7*  PLT 312 322 392    COAGULATION No results for input(s): INR in the last 168 hours.  CARDIAC  No results for input(s): TROPONINI in the last 168 hours.  Recent Labs Lab 01/29/14 0300  PROBNP 1260.0*     CHEMISTRY  Recent Labs Lab 01/25/14 0415 01/28/14 0344 01/29/14 0300 01/30/14 0258  NA 142 136* 136* 134*  K 5.0 3.5* 4.4 3.9  CL 103 100 100 98  CO2 $Re'28 22 25 20  'Czf$ GLUCOSE 127* 123* 242* 265*  BUN $Re'7 12 14 21  'GKQ$ CREATININE 0.62 0.69 0.68 0.61  CALCIUM 9.6 9.1 9.2 9.5   Estimated Creatinine Clearance: 34.6 mL/min (by C-G formula based on Cr of 0.61).   LIVER No results for input(s): AST, ALT, ALKPHOS, BILITOT, PROT, ALBUMIN, INR in the last 168 hours.   INFECTIOUS  Recent Labs Lab 01/28/14 2142 01/30/14 0258  PROCALCITON 0.16 0.10     ENDOCRINE CBG (last 3)   Recent  Labs  01/30/14 1655 01/30/14 2105 01/31/14 0642  GLUCAP 213* 189* 193*         IMAGING x48h No results found.     ASSESSMENT / PLAN:  PULMONARY A: Acute on chronic respiratory failure on account of interstitial lung disease - NEW PROBLEM - Due to history of chronic nitrofurantoin exposure this is most likely cause of interstitial lung disease along with flareup - Alternate etiology is collagen-vascular disease related interstitial lung disease - Third possibility is idiopathic pulmonary fibrosis with current admissions  being due to flareup - Acid reflux disease and hiatal hernia as a risk factor for interstitial lung disease -bnp 1260 -ESR 130    P:    -IV  Solu-Medrol $RemoveBefo'60mg'vlnHVxvwJrq$  q6h (fir IPF flare or nitrafurantoin related flare up)change to PO steroids and slow taper. 40 mg QD  x 1 week then 30 mg QD until seen in pulmonary office. -Monitor over several weeks for improvement  -If autoimmune panel negative and over several weeks or months she does not improve then the diagnosis of idiopathic pulmonary fibrosis which has a bad prognosis and is generally a terminal diagnosis even though the specific reviewed and available to slow the progression of this disease I doubt she would be a candidate for that given her frail and elderly age but this is a discussion for the outpatient setting  -Definitely continue acid reflux therapy  - NO NITRAFURANTOIN EVER  - stop antibiotic therapy   -Continue oxygen to keep pulse ox greater than 88%  -May need palliative care in future depending on how she does on steroids   - OPD followup with Dr Chase Caller ; She has had an appointment set up with Tammy Parrett ANP for 02/10/14 at 10:30 am. She will need to see Dr. Chase Caller in near future. Slow steroid taper.  Please note Steroid induced hyperglycemia. Should improve off Iv steroids.  Richardson Landry Danika Kluender ACNP Maryanna Shape PCCM Pager [phone] till 3 pm If no answer page [phone] 01/31/2014, 8:59 AM

## 2014-02-01 LAB — CYCLIC CITRUL PEPTIDE ANTIBODY, IGG: Cyclic Citrullin Peptide Ab: <2 U/mL (ref 0.0–5.0)

## 2014-02-10 ENCOUNTER — Ambulatory Visit (INDEPENDENT_AMBULATORY_CARE_PROVIDER_SITE_OTHER)
Admission: RE | Admit: 2014-02-10 | Discharge: 2014-02-10 | Disposition: A | Discharge status: Home / Self Care | Payer: Medicare Other | Source: Ambulatory Visit | Attending: Adult Health | Admitting: Adult Health

## 2014-02-10 ENCOUNTER — Encounter: Payer: Self-pay | Admitting: Adult Health

## 2014-02-10 ENCOUNTER — Ambulatory Visit (INDEPENDENT_AMBULATORY_CARE_PROVIDER_SITE_OTHER): Payer: Medicare Other | Admitting: Adult Health

## 2014-02-10 VITALS — BP 122/74 | HR 90 | Temp 98.0°F | Ht <= 58 in | Wt 124.0 lb

## 2014-02-10 DIAGNOSIS — J849 Interstitial pulmonary disease, unspecified: Secondary | ICD-10-CM

## 2014-02-10 DIAGNOSIS — J189 Pneumonia, unspecified organism: Secondary | ICD-10-CM

## 2014-02-10 MED ORDER — PREDNISONE 10 MG PO TABS: ORAL_TABLET | ORAL | Status: DC

## 2014-02-10 NOTE — Patient Instructions (Signed)
Continue on Prednisone 30mg  daily for 1 week.  Then Begin Prednisone 20mg  daily for 3 weeks  Then Prednisone 10mg  daily -hold at this dose  Follow up Dr. Chase Caller in 4-6 weeks and As needed   Please contact office for sooner follow up if symptoms do not improve or worsen or seek emergency care

## 2014-02-10 NOTE — Addendum Note (Signed)
Addended by: Parke Poisson E on: 02/10/2014 04:59 PM   Modules accepted: Orders, Medications

## 2014-02-10 NOTE — Assessment & Plan Note (Signed)
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  Then Begin Prednisone $RemoveBeforeDE'20mg'ptWUWUdrTaymZop$  daily for 3 weeks  Then Prednisone $RemoveBeforeD'10mg'yPXixycFyEjPvT$  daily -hold at this dose  Follow up Dr. Chase Caller in 4-6 weeks and As needed   Please contact office for sooner follow up if symptoms do not improve or worsen or seek emergency care

## 2014-02-10 NOTE — Progress Notes (Signed)
   Subjective:    Patient ID: Jodi Price, female    DOB: Nov 09, 1919, 80 y.o.   MRN: [id number]  HPI 80 yo   02/10/2014 [facility] follow up  Patient returns for a post hospital follow-up. Patient was recently admitted November 17-20 for pneumonia. Readmitted 11/21 with fever and hypoxia . CT chest showed diffuse ILD . She had been on chronic nitrofurantoin and was recommended to be stopped.  She was treated with aggressive IV antibiotics, and started on a steroid challenge. Currently tapered prednisone to $RemoveBefor'30mg'iSJZPlYUfTMV$  daily  Has hx of remote smoking  Was in Skilled care now back In independent living apartment  With her family today in office .  cxr today showed improvement with decreased bilateral infiltrates.  ESR was 130 .  Remains on 2 l/m of O2 , needs more portable light weight system.  Has DM , on SSI while on steroids      Review of Systems Constitutional:   No  weight loss, night sweats,  Fevers, chills, fatigue, or  lassitude.  HEENT:   No headaches,  Difficulty swallowing,  Tooth/dental problems, or  Sore throat,                No sneezing, itching, ear ache,  +nasal congestion, post nasal drip,   CV:  No chest pain,  Orthopnea, PND, swelling in lower extremities, anasarca, dizziness, palpitations, syncope.   GI  No heartburn, indigestion, abdominal pain, nausea, vomiting, diarrhea, change in bowel habits, loss of appetite, bloody stools.   Resp:    No chest wall deformity  Skin: no rash or lesions.  GU: no dysuria, change in color of urine, no urgency or frequency.  No flank pain, no hematuria   MS:  No joint pain or swelling.  No decreased range of motion.  No back pain.  Psych:  No change in mood or affect. No depression or anxiety.  No memory loss.         Objective:   Physical Exam GEN: A/Ox3; pleasant , NAD, elderly w/ walker w/ seat   HEENT:  [place]/AT,  EACs-clear, TMs-wnl, NOSE-clear, THROAT-clear, no lesions, no postnasal drip or exudate noted.    NECK:  Supple w/ fair ROM; no JVD; normal carotid impulses w/o bruits; no thyromegaly or nodules palpated; no lymphadenopathy.  RESP  Faint basilar crackles no accessory muscle use, no dullness to percussion  CARD:  RRR, no m/r/g  , no peripheral edema, pulses intact, no cyanosis or clubbing.  GI:   Soft & nt; nml bowel sounds; no organomegaly or masses detected.  Musco: Warm bil, no deformities or joint swelling noted.   Neuro: alert, no focal deficits noted.    Skin: Warm, no lesions or rashes         Assessment & Plan:

## 2014-02-11 ENCOUNTER — Telehealth: Payer: Self-pay | Admitting: Adult Health

## 2014-02-11 NOTE — Telephone Encounter (Addendum)
Pt does not have oxyge

## 2014-02-11 NOTE — Telephone Encounter (Signed)
The pt does have oxygen, and per the family it is through [facility]. Melissa called back to verify that the pt had some oxygen at home to use since they could not get her something more portable until next week. We are still waiting on call back from Dr. Woody Seller nurse to discuss them doing a qualifying sat test on the pt. [place] Bing, CMA

## 2014-02-11 NOTE — Telephone Encounter (Addendum)
I spoke with Melissa from [facility] and she states that the pt was started on oxygen while she was in the hospital and then was discharged to a skilled nursing facility that took over her oxygen needs. Since the pt has now been discharged and is home she will need to be treated as a new oxygen start, meaning they will need OV notes, qualifying sats, etc. The oxygen the pt has now will need to be returned to the other provider but she does have oxygen for the weekend. Qualifying sats were not done at time of the appt with TP so we will need to bring the pt back in for this.   I called and spoke with the pt daughter and advised. She was very upset because the pt is 72 and it is very hard to get her to appts and such. She states that the pt has an appt with her PCP on Tuesday and wants to know can they do the qualifying sats and fax it to [place]. I advised I will check with [facility] to see.   I called and spoke with Melissa and she states it does not matter where the sats are done as long as all the needed info is included. I spoke with pt daughter and advised of this and she will have PCP do sats. I advised I will also call the PCP office to see if they can do the sats and make sure they know what info is needed. I had to [facility] for Dr.Furr's nurse # is [phone]. They will have the nurse call us back so we can advise her on what is needed for the test and have them fax it to [place] once it is completed. I will await the call back. [place] Bing, CMA

## 2014-02-14 ENCOUNTER — Telehealth: Payer: Self-pay | Admitting: Adult Health

## 2014-02-14 NOTE — Telephone Encounter (Signed)
Duplicate message I will close and continue in open message from 02/11/14. [place] Bing, CMA

## 2014-02-14 NOTE — Telephone Encounter (Signed)
02/14/2014 09:24 AM Phone (Incoming) dr furr office  °said Jodi had left her a msg [phone]  ° °By Theresa C Bowne, MA  ° °I spoke with Michelle at Dr. Furrs office and advised of what was needed. She asked that I fax over the info that is needed and she will check with Dr. Furr to see if it is ok for her to complete. She will fax the info to our office once complete. I sent fax with qualifying sats requirements and asked for it to be faxed back to triage fax. Pt has an appt tomorrow with Dr. Furr so I will leave message open and will await fax. Jodi Price, CMA °  °

## 2014-02-17 ENCOUNTER — Telehealth: Payer: Self-pay | Admitting: Adult Health

## 2014-02-17 ENCOUNTER — Telehealth: Payer: Self-pay | Admitting: Internal Medicine

## 2014-02-17 NOTE — Telephone Encounter (Signed)
cxr report from 02/10/14 was faxed to the number provided  [facility] for [name] to be made aware

## 2014-02-17 NOTE — Telephone Encounter (Signed)
LMTCB for Jodi Price. 

## 2014-02-17 NOTE — Telephone Encounter (Signed)
lmtcb for [facility] w/ receptionist

## 2014-02-18 NOTE — Telephone Encounter (Signed)
Spoke with Butch Penny at [facility], states that TP advised pt to decrease prednisone to 20mg  on 02/10/14 at ov.  Pt saw her PCP Dr Rozetta Nunnery on Monday, was advised by pcp to stay on 30mg  prednisone daily.  Pt c/o prod cough with white mucus, sob with exertion.  Wants to know what dosage of prednisone she should take, and if she is ok to take mucinex dm to help bring up mucus.    Last ov: 12/10 with TP Next ov: 03/24/14 with MR  MR please advise on recs.  Thanks!

## 2014-02-18 NOTE — Telephone Encounter (Signed)
Ok to stay on prednisone 30mg  per day x 2 weeks then reduce to 20mg  per day. Keep aptt with me on 02/02/15. A caretaker needs to ensure this transition can happen. IF fever, or yellow/green sputum then call back for abx rx or go to ER if worse  Dr. Brand Males, M.D., [facility].C.P Pulmonary and Critical Care Medicine Staff Physician [place] Pager: [phone], If no answer or between  15:00h - 7:00h: call [phone]  02/18/2014 12:44 PM

## 2014-02-18 NOTE — Telephone Encounter (Signed)
Butch Penny aware of recs.  Nothing further needed.

## 2014-02-18 NOTE — Telephone Encounter (Signed)
LMTCB

## 2014-02-18 NOTE — Telephone Encounter (Signed)
lmtcb

## 2014-02-18 NOTE — Telephone Encounter (Signed)
lmomtcb x1 for Jodi Price.  

## 2014-02-21 ENCOUNTER — Telehealth: Payer: Self-pay | Admitting: Internal Medicine

## 2014-02-21 NOTE — Telephone Encounter (Signed)
--  Previous messages closed in error--  Spoke with [name], requesting that recent office notes and cxr be faxed over to [facility] ATTN: Bronson Ing  F# [phone] CXR needs to be noted that there is no TB risk and no communicable disease (HIV) Will hold in triage to follow up with in AM to ensure received.   Called daughter back to advise that order/instructions for sliding scale of Novolog needs to come from PCP Left detailed message on machine to return call.

## 2014-02-21 NOTE — Telephone Encounter (Addendum)
Spoke with [name], requesting that recent office notes and cxr be faxed over to [facility] ATTN: Bronson Ing  F# [phone] CXR needs to be noted that there is no TB risk and no communicable disease (HIV) Called daughter back to advise that order/instructions for sliding scale of Novolog needs to come from PCP Left detailed message on machine to return call.   Will document this "open" encounter dated 02/21/14

## 2014-02-21 NOTE — Telephone Encounter (Signed)
02/14/2014 09:24 AM Phone (Incoming) dr furr office  said Anderson Malta had left her a msg [phone]   By Harland German, MA   I spoke with Sharyn Lull at Dr. Woody Seller office and advised of what was needed. She asked that I fax over the info that is needed and she will check with Dr. Rozetta Nunnery to see if it is ok for her to complete. She will fax the info to our office once complete. I sent fax with qualifying sats requirements and asked for it to be faxed back to triage fax. Pt has an appt tomorrow with Dr. Rozetta Nunnery so I will leave message open and will await fax. [place] Bing, CMA

## 2014-02-21 NOTE — Telephone Encounter (Signed)
Leslie sent chest x-ray report to [facility] but it needs to have documention stating "no tuberculosis or communicable disease". Please fax to [phone] attn Pershing Proud at [place]. Call dtr when faxed.

## 2014-02-21 NOTE — Telephone Encounter (Signed)
LMTCB for [employer]

## 2014-02-22 NOTE — Telephone Encounter (Signed)
PT RETURN CALL.Jodi Price

## 2014-02-22 NOTE — Telephone Encounter (Signed)
It is fine to get PPD . No it is fine .  She has been tapered to low dose of prednisone

## 2014-02-22 NOTE — Telephone Encounter (Signed)
Spoke with Jodi Price and notified of recs per TP  She verbalized understanding  Order faxed for 2 step TB skin test- fax [phone]

## 2014-02-22 NOTE — Telephone Encounter (Addendum)
Called and spoke to [place]. Informed [name] that pt's PCP will need give further instructions regarding her insulin. [name] verbalized understanding and denied any further questions or concerns at this time. Called and spoke to [place] at [place] facility [phone]) and questioned if they received the fax of the CXR. Jerene Pitch stated she is unsure if the CXR with the written message of " no TB risk" will be accepted. Jerene Pitch stated she will ask her superior if the CXR fax will be accepted and will call back. Will await call.

## 2014-02-22 NOTE — Telephone Encounter (Signed)
Spoke with Abigail Butts from [facility], states that in order to be placed in their facility, the patient needs to have TB Skin testing done(they can perform this). Needing to know if there is a reason as to why the patient did not already have this done or why the patient cannot get a TB test. Abigail Butts states that she was advised by pt daughter that they were advised against TB skin testing since the patient is on maintenance dose Prednisone--does this alter results in any way? Abigail Butts states that this is a requirement for placement and the only way that the patient is exempt is if they have had a positive PPD in the past.   Please advise Tammy Parrett, thanks.

## 2014-03-24 ENCOUNTER — Ambulatory Visit: Payer: Medicare Other | Admitting: Internal Medicine

## 2014-03-29 ENCOUNTER — Ambulatory Visit (INDEPENDENT_AMBULATORY_CARE_PROVIDER_SITE_OTHER): Payer: Medicare Other | Admitting: Internal Medicine

## 2014-03-29 ENCOUNTER — Encounter: Payer: Self-pay | Admitting: Internal Medicine

## 2014-03-29 ENCOUNTER — Ambulatory Visit (INDEPENDENT_AMBULATORY_CARE_PROVIDER_SITE_OTHER)
Admission: RE | Admit: 2014-03-29 | Discharge: 2014-03-29 | Disposition: A | Discharge status: Home / Self Care | Payer: Medicare Other | Source: Ambulatory Visit | Attending: Internal Medicine | Admitting: Internal Medicine

## 2014-03-29 ENCOUNTER — Telehealth: Payer: Self-pay | Admitting: Internal Medicine

## 2014-03-29 VITALS — BP 138/62 | HR 64 | Ht 59.0 in | Wt 134.0 lb

## 2014-03-29 DIAGNOSIS — J849 Interstitial pulmonary disease, unspecified: Secondary | ICD-10-CM

## 2014-03-29 DIAGNOSIS — R600 Localized edema: Secondary | ICD-10-CM | POA: Insufficient documentation

## 2014-03-29 DIAGNOSIS — J9611 Chronic respiratory failure with hypoxia: Secondary | ICD-10-CM

## 2014-03-29 MED ORDER — FUROSEMIDE 20 MG PO TABS: 20.0000 mg | ORAL_TABLET | Freq: Every day | ORAL | Status: AC

## 2014-03-29 NOTE — Patient Instructions (Addendum)
ICD-9-CM ICD-10-CM   1. Chronic respiratory failure with hypoxia 518.83 J96.11    799.02    2. Pedal edema 782.3 R60.0   3. ILD (interstitial lung disease) 515 J84.9    - do cxr portable 03/29/2014  - start lasix 20mg  po daily; FURR,SARA, MD to titrate further depending on response - check walk test on room air  - depending on results will order portable o2 - for now continue prednisone 10mg  per day - do ONO test on RA  Followup  -- will call with results of cxr and ono to decide on prednisone taper and o2 need and  followup

## 2014-03-29 NOTE — Telephone Encounter (Signed)
Let family of Jodi Price know that   A) no need for ambulatory o2 B) await ono to decide on night o2 C) cxr shows persistent ILD - continue pred at 10mg  per day, continue lasix. -> give ROV with TAMMY Parreet in 3-4 weeks to reassesss  Thanks  Dr. Brand Males, M.D., [facility].C.P Pulmonary and Critical Care Medicine Staff Physician [place] [facility] Pager: [phone], If no answer or between  15:00h - 7:00h: call [phone]  03/29/2014 6:27 PM

## 2014-03-29 NOTE — Progress Notes (Signed)
Subjective:    Patient ID: Jodi Price, female    DOB: 1919-10-24, 81 y.o.   MRN: [id number]  HPI  OV 03/29/2014  Chief Complaint  Patient presents with  . Follow-up    Pt seen by TP for HFU. Pt c/o BIL LE pitting edema, DOE. Pt denies significant cough/     81 year old lady accompanied by her daughter and son in law ([employer] coordonator Claudia Desanctis)  First seen by me around Thanksgiving 2015 for acute hypoxemic respiratory failure with pulmonary infiltrates. Presumptive diagnosis of nitrofurantoin toxicity related interstitial lung disease made and started on prednisone (she also has history of acid reflux, autoimmune test 01/29/2014 was negative other than the fact ANA was slightly weakly positive at 1:160]. Since then she has followed up with my nurse practitioner in December 2015 chest x-ray showed persistence of interstitial infiltrates. She was still oxygen dependent. Decision to taper prednisone was made because of some side effects. Currently she is on 10 mg prednisone. Daughter and son in law stated that as of a month ago she was still desaturating with exertion. Patient herself is demented but does admit to the fact that she does have some dyspnea. She is using a cylinder which poses a fall risk for her and they're wondering about portable oxygen systems. In addition for the last few to several days she's had worsening pedal edema. [facility]  [facility] primary care physician has not had addressed this and is not aware of this  According to the family the main focuses concurrent palliation with basic medical care. No advanced interventions. They are already concerned about prednisone side effects from weight gain which has happened and patient has gained 10 pounds of weight due to increased blood sugars which the patient is currently experiencing. To the extent possible they would like her weaned off prednisone  Room air pulse ox was normal  Walking desaturation test 185  feet 3 laps with a full head probe on room air: able to do only 1.5 laps with 2 people assist. STopped duee to fatigue - this is more walking than baseline for her - did NOT Desat  CXR  No results found. - persistent ILD changes. Unchanged from dec 2015 (my interpretnation, offical result pending)    Review of Systems  Constitutional: Negative for fever and unexpected weight change.  HENT: Negative for congestion, dental problem, ear pain, nosebleeds, postnasal drip, rhinorrhea, sinus pressure, sneezing, sore throat and trouble swallowing.   Eyes: Negative for redness and itching.  Respiratory: Positive for shortness of breath. Negative for cough, chest tightness and wheezing.   Cardiovascular: Positive for leg swelling. Negative for palpitations.  Gastrointestinal: Negative for nausea and vomiting.  Genitourinary: Negative for dysuria.  Musculoskeletal: Negative for joint swelling.  Skin: Negative for rash.  Neurological: Negative for headaches.  Hematological: Does not bruise/bleed easily.  Psychiatric/Behavioral: Negative for dysphoric mood. The patient is not nervous/anxious.     Current outpatient prescriptions:  .  albuterol (PROVENTIL HFA;VENTOLIN HFA) 108 (90 BASE) MCG/ACT inhaler, Inhale 2 puffs into the lungs every 6 (six) hours as needed for wheezing or shortness of breath. Use 2 puffs 3 times daily x 5 days then 2 puffs every 6 hours as needed., Disp: 1 Inhaler, Rfl: 0 .  amLODipine (NORVASC) 5 MG tablet, Take 5 mg by mouth daily., Disp: , Rfl:  .  feeding supplement, GLUCERNA SHAKE, (GLUCERNA SHAKE) LIQD, Take 237 mLs by mouth daily., Disp: , Rfl: 0 .  FLUoxetine (PROZAC) 20 MG capsule, Take 20 mg by mouth daily., Disp: , Rfl:  .  insulin aspart (NOVOLOG) 100 UNIT/ML injection, Inject 0-9 Units into the skin 3 (three) times daily with meals., Disp: 10 mL, Rfl: 11 .  ipratropium-albuterol (DUONEB) 0.5-2.5 (3) MG/3ML SOLN, Take 3 mLs by nebulization every 4 (four) hours as  needed., Disp: , Rfl:  .  levothyroxine (SYNTHROID, LEVOTHROID) 50 MCG tablet, Take 50 mcg by mouth daily., Disp: , Rfl:  .  magnesium oxide (MAG-OX) 400 MG tablet, Take 400 mg by mouth 2 (two) times daily., Disp: , Rfl:  .  metoprolol (LOPRESSOR) 50 MG tablet, Take 50 mg by mouth daily., Disp: , Rfl:  .  omeprazole (PRILOSEC) 40 MG capsule, Take 40 mg by mouth daily., Disp: , Rfl:  .  polyethylene glycol (MIRALAX / GLYCOLAX) packet, Take 17 g by mouth 2 (two) times daily., Disp: 14 each, Rfl: 0 .  potassium chloride (K-DUR) 10 MEQ tablet, Take 10 mEq by mouth daily., Disp: , Rfl:  .  predniSONE (DELTASONE) 10 MG tablet, Continue on Prednisone 30mg  daily for 1 week, then 20mg  daily for 3 weeks, then 10mg  daily and hold at this dose, Disp: 100 tablet, Rfl: 1 .  senna-docusate (SENOKOT S) 8.6-50 MG per tablet, Take 1 tablet by mouth at bedtime., Disp: , Rfl:  .  Vitamin D, Ergocalciferol, (DRISDOL) 50000 UNITS CAPS capsule, Take 50,000 Units by mouth every 7 (seven) days., Disp: , Rfl:  .  aspirin 81 MG tablet, Take 81 mg by mouth daily., Disp: , Rfl:      Objective:   Physical Exam  Constitutional: She is oriented to person, place, and time. No distress.  Frail  HENT:  Head: Normocephalic and atraumatic.  Right Ear: External ear normal.  Left Ear: External ear normal.  Mouth/Throat: Oropharynx is clear and moist. No oropharyngeal exudate.  Eyes: Conjunctivae and EOM are normal. Pupils are equal, round, and reactive to light. Right eye exhibits no discharge. Left eye exhibits no discharge. No scleral icterus.  Neck: Normal range of motion. Neck supple. No JVD present. No tracheal deviation present. No thyromegaly present.  Cardiovascular: Normal rate, regular rhythm, normal heart sounds and intact distal pulses.  Exam reveals no gallop and no friction rub.   No murmur heard. Pulmonary/Chest: Effort normal. No respiratory distress. She has no wheezes. She has rales. She exhibits no tenderness.   Kyphotic  Abdominal: Soft. Bowel sounds are normal. She exhibits no distension and no mass. There is no tenderness. There is no rebound and no guarding.  Musculoskeletal: Normal range of motion. She exhibits no edema or tenderness.  Kyphotic has to walk with assist sitting in wheel chair  Lymphadenopathy:    She has no cervical adenopathy.  Neurological: She is alert and oriented to person, place, and time. She has normal reflexes. No cranial nerve deficit. She exhibits normal muscle tone. Coordination normal.  Skin: Skin is warm and dry. No rash noted. She is not diaphoretic. No erythema. No pallor.  Psychiatric:  Has dementia. Remote memory intact. Smiles but otherwise does not give any history  Vitals reviewed.   Filed Vitals:   03/29/14 1525  BP: 138/62  Pulse: 64  Height: 4\' 11"  (1.499 m)  Weight: 134 lb (60.782 kg)  SpO2: 98%          Assessment & Plan:     ICD-9-CM ICD-10-CM   1. Chronic respiratory failure with hypoxia 518.83 J96.11    799.02  2. Pedal edema 782.3 R60.0   3. ILD (interstitial lung disease) 515 J84.9    Appears on CXR an exam, ILD is unchanged.  Puzzling why has not improved despite dc nitrafurantoin and rx steroids. She might have some diastolich congestion too as seen by pulmonary edema. FOcus is on quality of life. At prednisone 10mg  per day they are noticing weight gain and hyperglycemia. They are not too [place] on hiigher steroids. There is concern she will trip and fall over o2 cords ; she is high fall risk anways. It is reassuring she did not desat with exertion  PLAN/rec  - dc ambulatory o2  - test ONO RA - and if ok dc o2 all together -  - lasix for edema - pcp FURR,SARA, MD  to titrate, I can start - continue pred at 10mg  per day - avoid CT chest and lung bx (not c/w her goals of care)  Followup  1 months with NP Tammyfor consideration of further reduction in pred dose; will likely need CXR at followup  > 50% of this > 25 min visit  spent in face to face counseling or coordination of care (15 min visit converted to 25 min)    Dr. Brand Males, M.D., [facility].C.P Pulmonary and Critical Care Medicine Staff Physician [place] [facility] [place] [facility] Pager: [phone], If no answer or between  15:00h - 7:00h: call [phone]  03/29/2014 6:19 PM

## 2014-03-31 NOTE — Telephone Encounter (Signed)
Called and spoke to pt's daughter. Informed pt's daughter of the recs per MR. Appt made with TP on 04/29/14. Pt 's daughter verbalized understanding and also stated pt will need an rx faxed to [facility] facility to discontinue daytime O2. Written order signed by MR and faxed to [phone] with attn to Deb. Pt's daughter verbalized understanding and denied any further questions or concerns at this time.

## 2014-04-27 ENCOUNTER — Telehealth: Payer: Self-pay | Admitting: Internal Medicine

## 2014-04-27 NOTE — Telephone Encounter (Signed)
Let them know thatn Jodi Price did have some desaturations at night < 88% at 58 minutes - not mandated she use o2 esp if there is safety concern of tripping over cords. IF she wants to use at night is fine but if not ok too

## 2014-04-27 NOTE — Telephone Encounter (Signed)
Called and spoke to pt's daughter, [name]. Informed [name] of the results per MR. [name] stated she would like pt without nocturnal O2. Pt has appt with TP on 2/26 and is requesting a signed order for pt for SNF. Joselyn Arrow the order can be signed at [place]. [name] verbalized understanding and denied any further questions or concerns at this time.   Will forward to Jess to make aware.

## 2014-04-29 ENCOUNTER — Ambulatory Visit (INDEPENDENT_AMBULATORY_CARE_PROVIDER_SITE_OTHER): Payer: Medicare Other | Admitting: Adult Health

## 2014-04-29 ENCOUNTER — Encounter: Payer: Self-pay | Admitting: Adult Health

## 2014-04-29 VITALS — BP 122/74 | HR 81 | Temp 98.1°F | Ht 59.0 in | Wt 136.4 lb

## 2014-04-29 DIAGNOSIS — H6121 Impacted cerumen, right ear: Secondary | ICD-10-CM

## 2014-04-29 DIAGNOSIS — J9611 Chronic respiratory failure with hypoxia: Secondary | ICD-10-CM

## 2014-04-29 DIAGNOSIS — H6120 Impacted cerumen, unspecified ear: Secondary | ICD-10-CM | POA: Insufficient documentation

## 2014-04-29 DIAGNOSIS — J849 Interstitial pulmonary disease, unspecified: Secondary | ICD-10-CM

## 2014-04-29 NOTE — Patient Instructions (Addendum)
Decrease Prednisone 5mg  daily  May use O2 at 2.5l/m with activity , portable concentrator evaluation sent.  Follow up Dr. Chase Caller in 2 months and As needed   Chest xray next visit.

## 2014-04-29 NOTE — Progress Notes (Signed)
Subjective:    Patient ID: Jodi Price, female    DOB: [birth year], 81 y.o.   MRN: [id number]  HPI  OV 03/29/2014  Chief Complaint  Patient presents with  . Follow-up    Pt seen by TP for HFU. Pt c/o BIL LE pitting edema, DOE. Pt denies significant cough/     81 year old lady accompanied by her daughter and son in law ([employer] coordonator Claudia Desanctis)  First seen by me around Thanksgiving 2015 for acute hypoxemic respiratory failure with pulmonary infiltrates. Presumptive diagnosis of nitrofurantoin toxicity related interstitial lung disease made and started on prednisone (she also has history of acid reflux, autoimmune test 01/29/2014 was negative other than the fact ANA was slightly weakly positive at 1:160]. Since then she has followed up with my nurse practitioner in December 2015 chest x-ray showed persistence of interstitial infiltrates. She was still oxygen dependent. Decision to taper prednisone was made because of some side effects. Currently she is on 10 mg prednisone. Daughter and son in law stated that as of a month ago she was still desaturating with exertion. Patient herself is demented but does admit to the fact that she does have some dyspnea. She is using a cylinder which poses a fall risk for her and they're wondering about portable oxygen systems. In addition for the last few to several days she's had worsening pedal edema. [facility] care physician has not had addressed this and is not aware of this  According to the family the main focuses concurrent palliation with basic medical care. No advanced interventions. They are already concerned about prednisone side effects from weight gain which has happened and patient has gained 10 pounds of weight due to increased blood sugars which the patient is currently experiencing. To the extent possible they would like her weaned off prednisone  Room air pulse ox was normal  Walking desaturation test 185  feet 3 laps with a full head probe on room air: able to do only 1.5 laps with 2 people assist. STopped duee to fatigue - this is more walking than baseline for her - did NOT Desat  CXR  No results found. - persistent ILD changes. Unchanged from dec 2015 (my interpretnation, offical result pending)   04/29/2014 hypoxemic respiratory failure with pulmonary infiltrates. Presumptive diagnosis of nitrofurantoin toxicity related interstitial lung disease  Pt returns for follow up with family  Says she is doing well overall . Currently on 5mg  daily  Last ov O2 was stopped but family wants her to be rechecked as when she walks she drops her O2 to 88% Today in office O2 sat 88% on RA. We discussed portable concentrator .  She is in assited living doing well.  Has stopped up ears w/ decreased hearing.  Unable to do chest xray today as xray dept closed.  She denies any chest pain, orthopnea, PND, leg swelling or hemoptysis. Is that her breathing is improved except for when she walks she gets a little winded.    Review of Systems  Constitutional: Negative for fever and unexpected weight change.  HENT: Negative for congestion, dental problem, ear pain, nosebleeds, postnasal drip, rhinorrhea, sinus pressure, sneezing, sore throat and trouble swallowing.   Eyes: Negative for redness and itching.  Respiratory: Positive for shortness of breath. Negative for cough, chest tightness and wheezing.   Cardiovascular:   Negative for palpitations.  Gastrointestinal: Negative for nausea and vomiting.  Genitourinary: Negative for dysuria.  Musculoskeletal: Negative  for joint swelling.  Skin: Negative for rash.  Neurological: Negative for headaches.  Hematological: Does not bruise/bleed easily.  Psychiatric/Behavioral: Negative for dysphoric mood. The patient is not nervous/anxious.         Objective:   Physical Exam  Constitutional: She is oriented to person, place, and time. No distress.  Frail  HENT:    Head: Normocephalic and atraumatic.  Right Ear: EAC -cerumen impaction Left Ear: External ear normal.  Mouth/Throat: Oropharynx is clear and moist. No oropharyngeal exudate.  Eyes: Conjunctivae and EOM are normal. Pupils are equal, round, and reactive to light. Right eye exhibits no discharge. Left eye exhibits no discharge. No scleral icterus.  Neck: Normal range of motion. Neck supple. No JVD present. No tracheal deviation present. No thyromegaly present.  Cardiovascular: Normal rate, regular rhythm, normal heart sounds and intact distal pulses.  Exam reveals no gallop and no friction rub.   No murmur heard. Pulmonary/Chest: Effort normal. No respiratory distress. She has no wheezes. She has rales. She exhibits no tenderness.  Kyphotic  Abdominal: Soft. Bowel sounds are normal. She exhibits no distension and no mass. There is no tenderness. There is no rebound and no guarding.  Musculoskeletal: Normal range of motion. She exhibits no edema or tenderness.  Kyphotic has to walk with assist sitting in wheel chair  Lymphadenopathy:    She has no cervical adenopathy.  Neurological: She is alert and oriented to person, place, and time. She has normal reflexes. No cranial nerve deficit. She exhibits normal muscle tone. Coordination normal.  Skin: Skin is warm and dry. No rash noted. She is not diaphoretic. No erythema. No pallor.  Psychiatric:  Has dementia. Remote memory intact. Smiles    Vitals reviewed.        Assessment & Plan:  \

## 2014-04-29 NOTE — Assessment & Plan Note (Signed)
hypoxemic respiratory failure with pulmonary infiltrates. Presumptive diagnosis of nitrofurantoin toxicity related interstitial lung disease  Clinically improving  Will restart O2 .   Plan  Decrease Prednisone 5mg  daily  May use O2 at 2.5l/m with activity , portable concentrator evaluation sent.  Follow up Dr. Chase Caller in 2 months and As needed   Chest xray next visit.

## 2014-04-29 NOTE — Telephone Encounter (Signed)
Pt seen 04/29/14 Will sign off

## 2014-04-29 NOTE — Assessment & Plan Note (Signed)
Desats with act 88% on RA   Plan  Restart O2  POC eval .

## 2014-04-29 NOTE — Assessment & Plan Note (Addendum)
Right sided cerumen impaction Ear irrigation without difficulty EAC was clear afterwards Advise use Debrox  as needed

## 2014-05-02 MED ORDER — PREDNISONE 5 MG PO TABS: 5.0000 mg | ORAL_TABLET | Freq: Every day | ORAL | Status: AC

## 2014-05-02 NOTE — Addendum Note (Signed)
Addended by: Parke Poisson E on: 05/02/2014 01:56 PM   Modules accepted: Orders, Medications

## 2014-05-23 ENCOUNTER — Encounter: Payer: Self-pay | Admitting: Internal Medicine

## 2014-06-24 ENCOUNTER — Emergency Department (HOSPITAL_COMMUNITY): Payer: Medicare Other

## 2014-06-24 ENCOUNTER — Encounter (HOSPITAL_COMMUNITY): Payer: Self-pay | Admitting: Cardiology

## 2014-06-24 ENCOUNTER — Inpatient Hospital Stay (HOSPITAL_COMMUNITY)
Admission: EM | Admit: 2014-06-24 | Discharge: 2014-07-03 | DRG: 193 | Disposition: E | Payer: Medicare Other | Attending: Internal Medicine | Admitting: Internal Medicine

## 2014-06-24 DIAGNOSIS — I482 Chronic atrial fibrillation, unspecified: Secondary | ICD-10-CM | POA: Diagnosis present

## 2014-06-24 DIAGNOSIS — R0602 Shortness of breath: Secondary | ICD-10-CM

## 2014-06-24 DIAGNOSIS — E785 Hyperlipidemia, unspecified: Secondary | ICD-10-CM | POA: Diagnosis present

## 2014-06-24 DIAGNOSIS — F0390 Unspecified dementia without behavioral disturbance: Secondary | ICD-10-CM | POA: Diagnosis present

## 2014-06-24 DIAGNOSIS — E039 Hypothyroidism, unspecified: Secondary | ICD-10-CM | POA: Diagnosis present

## 2014-06-24 DIAGNOSIS — N189 Chronic kidney disease, unspecified: Secondary | ICD-10-CM | POA: Diagnosis present

## 2014-06-24 DIAGNOSIS — Z96653 Presence of artificial knee joint, bilateral: Secondary | ICD-10-CM | POA: Diagnosis present

## 2014-06-24 DIAGNOSIS — I5032 Chronic diastolic (congestive) heart failure: Secondary | ICD-10-CM | POA: Diagnosis present

## 2014-06-24 DIAGNOSIS — J438 Other emphysema: Secondary | ICD-10-CM

## 2014-06-24 DIAGNOSIS — J189 Pneumonia, unspecified organism: Secondary | ICD-10-CM | POA: Diagnosis not present

## 2014-06-24 DIAGNOSIS — Z87891 Personal history of nicotine dependence: Secondary | ICD-10-CM

## 2014-06-24 DIAGNOSIS — Z9981 Dependence on supplemental oxygen: Secondary | ICD-10-CM

## 2014-06-24 DIAGNOSIS — E1142 Type 2 diabetes mellitus with diabetic polyneuropathy: Secondary | ICD-10-CM | POA: Diagnosis present

## 2014-06-24 DIAGNOSIS — E119 Type 2 diabetes mellitus without complications: Secondary | ICD-10-CM

## 2014-06-24 DIAGNOSIS — E876 Hypokalemia: Secondary | ICD-10-CM | POA: Diagnosis present

## 2014-06-24 DIAGNOSIS — J849 Interstitial pulmonary disease, unspecified: Secondary | ICD-10-CM | POA: Diagnosis present

## 2014-06-24 DIAGNOSIS — F329 Major depressive disorder, single episode, unspecified: Secondary | ICD-10-CM | POA: Diagnosis present

## 2014-06-24 DIAGNOSIS — Z515 Encounter for palliative care: Secondary | ICD-10-CM | POA: Diagnosis not present

## 2014-06-24 DIAGNOSIS — J439 Emphysema, unspecified: Secondary | ICD-10-CM | POA: Diagnosis present

## 2014-06-24 DIAGNOSIS — T378X5S Adverse effect of other specified systemic anti-infectives and antiparasitics, sequela: Secondary | ICD-10-CM | POA: Diagnosis not present

## 2014-06-24 DIAGNOSIS — I129 Hypertensive chronic kidney disease with stage 1 through stage 4 chronic kidney disease, or unspecified chronic kidney disease: Secondary | ICD-10-CM | POA: Diagnosis present

## 2014-06-24 DIAGNOSIS — Z79899 Other long term (current) drug therapy: Secondary | ICD-10-CM

## 2014-06-24 DIAGNOSIS — Z794 Long term (current) use of insulin: Secondary | ICD-10-CM

## 2014-06-24 DIAGNOSIS — K219 Gastro-esophageal reflux disease without esophagitis: Secondary | ICD-10-CM | POA: Diagnosis present

## 2014-06-24 DIAGNOSIS — E440 Moderate protein-calorie malnutrition: Secondary | ICD-10-CM | POA: Diagnosis present

## 2014-06-24 DIAGNOSIS — E118 Type 2 diabetes mellitus with unspecified complications: Secondary | ICD-10-CM

## 2014-06-24 DIAGNOSIS — J704 Drug-induced interstitial lung disorders, unspecified: Secondary | ICD-10-CM | POA: Diagnosis present

## 2014-06-24 DIAGNOSIS — R1310 Dysphagia, unspecified: Secondary | ICD-10-CM

## 2014-06-24 DIAGNOSIS — G629 Polyneuropathy, unspecified: Secondary | ICD-10-CM | POA: Diagnosis present

## 2014-06-24 DIAGNOSIS — Z7952 Long term (current) use of systemic steroids: Secondary | ICD-10-CM

## 2014-06-24 DIAGNOSIS — J9621 Acute and chronic respiratory failure with hypoxia: Secondary | ICD-10-CM | POA: Diagnosis not present

## 2014-06-24 DIAGNOSIS — J09X2 Influenza due to identified novel influenza A virus with other respiratory manifestations: Principal | ICD-10-CM | POA: Diagnosis present

## 2014-06-24 DIAGNOSIS — I10 Essential (primary) hypertension: Secondary | ICD-10-CM | POA: Diagnosis present

## 2014-06-24 DIAGNOSIS — Z66 Do not resuscitate: Secondary | ICD-10-CM | POA: Diagnosis present

## 2014-06-24 HISTORY — DX: Disorder of kidney and ureter, unspecified: N28.9

## 2014-06-24 LAB — CBC WITH DIFFERENTIAL/PLATELET
BASOS PCT: 0 % (ref 0–1)
Basophils Absolute: 0 10*3/uL (ref 0.0–0.1)
EOS PCT: 0 % (ref 0–5)
Eosinophils Absolute: 0 10*3/uL (ref 0.0–0.7)
HEMATOCRIT: 42.9 % (ref 36.0–46.0)
Hemoglobin: 14.1 g/dL (ref 12.0–15.0)
Lymphocytes Relative: 26 % (ref 12–46)
Lymphs Abs: 1 10*3/uL (ref 0.7–4.0)
MCH: 31.2 pg (ref 26.0–34.0)
MCHC: 32.9 g/dL (ref 30.0–36.0)
MCV: 94.9 fL (ref 78.0–100.0)
MONO ABS: 0.1 10*3/uL (ref 0.1–1.0)
Monocytes Relative: 3 % (ref 3–12)
NEUTROS ABS: 2.7 10*3/uL (ref 1.7–7.7)
Neutrophils Relative %: 71 % (ref 43–77)
Platelets: 175 10*3/uL (ref 150–400)
RBC: 4.52 MIL/uL (ref 3.87–5.11)
RDW: 13.2 % (ref 11.5–15.5)
WBC: 3.8 10*3/uL — AB (ref 4.0–10.5)

## 2014-06-24 LAB — TROPONIN I: Troponin I: <0.03 ng/mL (ref <0.031)

## 2014-06-24 LAB — URINE MICROSCOPIC-ADD ON

## 2014-06-24 LAB — COMPREHENSIVE METABOLIC PANEL
ALK PHOS: 54 U/L (ref 39–117)
ALT: 15 U/L (ref 0–35)
AST: 36 U/L (ref 0–37)
Albumin: 3.3 g/dL — ABNORMAL LOW (ref 3.5–5.2)
Anion gap: 18 — ABNORMAL HIGH (ref 5–15)
BILIRUBIN TOTAL: 0.9 mg/dL (ref 0.3–1.2)
BUN: 25 mg/dL — ABNORMAL HIGH (ref 6–23)
CALCIUM: 9.1 mg/dL (ref 8.4–10.5)
CHLORIDE: 97 mmol/L (ref 96–112)
CO2: 22 mmol/L (ref 19–32)
Creatinine, Ser: 1.04 mg/dL (ref 0.50–1.10)
GFR calc Af Amer: 52 mL/min — ABNORMAL LOW (ref >90)
GFR calc non Af Amer: 45 mL/min — ABNORMAL LOW (ref >90)
GLUCOSE: 146 mg/dL — AB (ref 70–99)
POTASSIUM: 3.1 mmol/L — AB (ref 3.5–5.1)
SODIUM: 137 mmol/L (ref 135–145)
Total Protein: 7 g/dL (ref 6.0–8.3)

## 2014-06-24 LAB — URINALYSIS, ROUTINE W REFLEX MICROSCOPIC
Bilirubin Urine: NEGATIVE
GLUCOSE, UA: NEGATIVE mg/dL
HGB URINE DIPSTICK: NEGATIVE
Ketones, ur: 15 mg/dL — AB
Nitrite: NEGATIVE
Protein, ur: 30 mg/dL — AB
SPECIFIC GRAVITY, URINE: 1.017 (ref 1.005–1.030)
Urobilinogen, UA: 0.2 mg/dL (ref 0.0–1.0)
pH: 5 (ref 5.0–8.0)

## 2014-06-24 LAB — GLUCOSE, CAPILLARY: GLUCOSE-CAPILLARY: 272 mg/dL — AB (ref 70–99)

## 2014-06-24 LAB — I-STAT ARTERIAL BLOOD GAS, ED
Acid-base deficit: 1 mmol/L (ref 0.0–2.0)
BICARBONATE: 23.2 meq/L (ref 20.0–24.0)
O2 SAT: 95 %
PO2 ART: 75 mmHg — AB (ref 80.0–100.0)
TCO2: 24 mmol/L (ref 0–100)
pCO2 arterial: 37.2 mmHg (ref 35.0–45.0)
pH, Arterial: 7.404 (ref 7.350–7.450)

## 2014-06-24 LAB — MRSA PCR SCREENING: MRSA by PCR: POSITIVE — AB

## 2014-06-24 LAB — TSH: TSH: 4.576 u[IU]/mL — AB (ref 0.350–4.500)

## 2014-06-24 LAB — I-STAT CG4 LACTIC ACID, ED: Lactic Acid, Venous: 1.78 mmol/L (ref 0.5–2.0)

## 2014-06-24 LAB — BRAIN NATRIURETIC PEPTIDE: B NATRIURETIC PEPTIDE 5: 180.3 pg/mL — AB (ref 0.0–100.0)

## 2014-06-24 MED ORDER — ACETAMINOPHEN 325 MG PO TABS: 650.0000 mg | ORAL_TABLET | Freq: Four times a day (QID) | ORAL | Status: DC | PRN

## 2014-06-24 MED ORDER — DEXTROSE 5 % IV SOLN
2.0000 g | Freq: Once | INTRAVENOUS | Status: AC
  Administered 2014-06-24: 2 g via INTRAVENOUS
  Filled 2014-06-24: qty 2

## 2014-06-24 MED ORDER — METHYLPREDNISOLONE SODIUM SUCC 125 MG IJ SOLR
125.0000 mg | Freq: Once | INTRAMUSCULAR | Status: AC
  Administered 2014-06-24: 125 mg via INTRAVENOUS
  Filled 2014-06-24: qty 2

## 2014-06-24 MED ORDER — CETYLPYRIDINIUM CHLORIDE 0.05 % MT LIQD: 7.0000 mL | Freq: Two times a day (BID) | OROMUCOSAL | Status: DC

## 2014-06-24 MED ORDER — FLUOXETINE HCL 20 MG PO CAPS
20.0000 mg | ORAL_CAPSULE | Freq: Every day | ORAL | Status: DC
  Administered 2014-06-24: 20 mg via ORAL
  Filled 2014-06-24: qty 1

## 2014-06-24 MED ORDER — LORAZEPAM 2 MG/ML IJ SOLN: 1.0000 mg | INTRAMUSCULAR | Status: DC | PRN

## 2014-06-24 MED ORDER — POLYETHYLENE GLYCOL 3350 17 G PO PACK
17.0000 g | PACK | Freq: Two times a day (BID) | ORAL | Status: DC
  Filled 2014-06-24: qty 1

## 2014-06-24 MED ORDER — MORPHINE SULFATE 2 MG/ML IJ SOLN
0.2500 mg | INTRAMUSCULAR | Status: DC | PRN
  Administered 2014-06-24: 0.25 mg via INTRAVENOUS
  Filled 2014-06-24: qty 1

## 2014-06-24 MED ORDER — VANCOMYCIN HCL IN DEXTROSE 750-5 MG/150ML-% IV SOLN
750.0000 mg | INTRAVENOUS | Status: DC
  Filled 2014-06-24: qty 150

## 2014-06-24 MED ORDER — ONDANSETRON HCL 4 MG/2ML IJ SOLN
4.0000 mg | Freq: Once | INTRAMUSCULAR | Status: AC
  Administered 2014-06-24: 4 mg via INTRAVENOUS
  Filled 2014-06-24: qty 2

## 2014-06-24 MED ORDER — LEVOTHYROXINE SODIUM 50 MCG PO TABS
50.0000 ug | ORAL_TABLET | Freq: Every day | ORAL | Status: DC
  Filled 2014-06-24: qty 1

## 2014-06-24 MED ORDER — MORPHINE SULFATE 25 MG/ML IV SOLN
2.0000 mg/h | INTRAVENOUS | Status: DC
  Administered 2014-06-24: 2 mg/h via INTRAVENOUS
  Filled 2014-06-24: qty 10

## 2014-06-24 MED ORDER — METOPROLOL TARTRATE 50 MG PO TABS: 50.0000 mg | ORAL_TABLET | Freq: Every day | ORAL | Status: DC

## 2014-06-24 MED ORDER — ENSURE ENLIVE PO LIQD: 237.0000 mL | Freq: Two times a day (BID) | ORAL | Status: DC

## 2014-06-24 MED ORDER — POTASSIUM CHLORIDE ER 10 MEQ PO TBCR
20.0000 meq | EXTENDED_RELEASE_TABLET | Freq: Every day | ORAL | Status: DC
  Administered 2014-06-24: 20 meq via ORAL
  Filled 2014-06-24: qty 2

## 2014-06-24 MED ORDER — MAGNESIUM SULFATE 2 GM/50ML IV SOLN
2.0000 g | Freq: Once | INTRAVENOUS | Status: AC
  Administered 2014-06-24: 2 g via INTRAVENOUS
  Filled 2014-06-24: qty 50

## 2014-06-24 MED ORDER — MUPIROCIN 2 % EX OINT
1.0000 | TOPICAL_OINTMENT | Freq: Two times a day (BID) | CUTANEOUS | Status: DC
Start: 2014-06-24 — End: 2014-06-24

## 2014-06-24 MED ORDER — SODIUM CHLORIDE 0.9 % IV SOLN
1000.0000 mL | INTRAVENOUS | Status: DC
  Administered 2014-06-24: 1000 mL via INTRAVENOUS

## 2014-06-24 MED ORDER — IPRATROPIUM BROMIDE 0.02 % IN SOLN
0.5000 mg | Freq: Four times a day (QID) | RESPIRATORY_TRACT | Status: DC
  Administered 2014-06-24: 0.5 mg via RESPIRATORY_TRACT
  Filled 2014-06-24: qty 2.5

## 2014-06-24 MED ORDER — DEXTROSE 5 % IV SOLN
1.0000 g | INTRAVENOUS | Status: DC
  Filled 2014-06-24: qty 1

## 2014-06-24 MED ORDER — CHLORHEXIDINE GLUCONATE 0.12 % MT SOLN
15.0000 mL | Freq: Two times a day (BID) | OROMUCOSAL | Status: DC
  Filled 2014-06-24 (×4): qty 15

## 2014-06-24 MED ORDER — SODIUM CHLORIDE 0.9 % IV SOLN
INTRAVENOUS | Status: DC
  Administered 2014-06-24: 18:00:00 via INTRAVENOUS

## 2014-06-24 MED ORDER — VANCOMYCIN HCL IN DEXTROSE 1-5 GM/200ML-% IV SOLN
1000.0000 mg | Freq: Once | INTRAVENOUS | Status: AC
  Administered 2014-06-24: 1000 mg via INTRAVENOUS
  Filled 2014-06-24: qty 200

## 2014-06-24 MED ORDER — MORPHINE SULFATE 2 MG/ML IJ SOLN
INTRAMUSCULAR | Status: AC
  Administered 2014-06-24: 2 mg via INTRAVENOUS
  Filled 2014-06-24: qty 1

## 2014-06-24 MED ORDER — MORPHINE BOLUS VIA INFUSION
1.0000 mg | INTRAVENOUS | Status: DC | PRN
  Administered 2014-06-24 – 2014-06-25 (×2): 1 mg via INTRAVENOUS
  Filled 2014-06-24 (×3): qty 1

## 2014-06-24 MED ORDER — LEVALBUTEROL HCL 0.63 MG/3ML IN NEBU
0.6300 mg | INHALATION_SOLUTION | Freq: Four times a day (QID) | RESPIRATORY_TRACT | Status: DC
  Administered 2014-06-24: 0.63 mg via RESPIRATORY_TRACT
  Filled 2014-06-24 (×5): qty 3

## 2014-06-24 MED ORDER — ONDANSETRON HCL 4 MG/2ML IJ SOLN: 4.0000 mg | Freq: Four times a day (QID) | INTRAMUSCULAR | Status: DC | PRN

## 2014-06-24 MED ORDER — FAMOTIDINE 20 MG PO TABS
20.0000 mg | ORAL_TABLET | Freq: Every day | ORAL | Status: DC
  Administered 2014-06-24: 20 mg via ORAL
  Filled 2014-06-24: qty 1

## 2014-06-24 MED ORDER — PROPAFENONE HCL 150 MG PO TABS
150.0000 mg | ORAL_TABLET | Freq: Two times a day (BID) | ORAL | Status: DC
  Filled 2014-06-24: qty 1

## 2014-06-24 MED ORDER — METHYLPREDNISOLONE SODIUM SUCC 125 MG IJ SOLR
60.0000 mg | Freq: Three times a day (TID) | INTRAMUSCULAR | Status: DC
  Administered 2014-06-24: 60 mg via INTRAVENOUS
  Filled 2014-06-24: qty 2
  Filled 2014-06-24: qty 0.96
  Filled 2014-06-24: qty 2
  Filled 2014-06-24 (×2): qty 0.96

## 2014-06-24 MED ORDER — BUDESONIDE 0.25 MG/2ML IN SUSP
0.2500 mg | Freq: Two times a day (BID) | RESPIRATORY_TRACT | Status: DC
  Administered 2014-06-24: 0.25 mg via RESPIRATORY_TRACT
  Filled 2014-06-24 (×2): qty 2

## 2014-06-24 MED ORDER — INSULIN DETEMIR 100 UNIT/ML ~~LOC~~ SOLN
5.0000 [IU] | Freq: Every day | SUBCUTANEOUS | Status: DC
  Filled 2014-06-24: qty 0.05

## 2014-06-24 MED ORDER — MORPHINE SULFATE 2 MG/ML IJ SOLN
2.0000 mg | Freq: Once | INTRAMUSCULAR | Status: AC
Start: 2014-06-24 — End: 2014-06-24
  Administered 2014-06-24: 2 mg via INTRAVENOUS

## 2014-06-24 MED ORDER — INSULIN ASPART 100 UNIT/ML ~~LOC~~ SOLN: 0.0000 [IU] | Freq: Three times a day (TID) | SUBCUTANEOUS | Status: DC

## 2014-06-24 MED ORDER — MORPHINE SULFATE 2 MG/ML IJ SOLN: 0.2500 mg | Freq: Four times a day (QID) | INTRAMUSCULAR | Status: DC | PRN

## 2014-06-24 MED ORDER — CHLORHEXIDINE GLUCONATE CLOTH 2 % EX PADS: 6.0000 | MEDICATED_PAD | Freq: Every day | CUTANEOUS | Status: DC

## 2014-06-24 MED ORDER — ACETAMINOPHEN 650 MG RE SUPP: 650.0000 mg | Freq: Four times a day (QID) | RECTAL | Status: DC | PRN

## 2014-06-24 MED ORDER — IOHEXOL 350 MG/ML SOLN
80.0000 mL | Freq: Once | INTRAVENOUS | Status: AC | PRN
  Administered 2014-06-24: 80 mL via INTRAVENOUS

## 2014-06-24 MED ORDER — MORPHINE SULFATE 2 MG/ML IJ SOLN
0.2500 mg | Freq: Once | INTRAMUSCULAR | Status: AC
  Administered 2014-06-24: 0.25 mg via INTRAVENOUS
  Filled 2014-06-24: qty 1

## 2014-06-24 MED ORDER — ONDANSETRON HCL 4 MG PO TABS: 4.0000 mg | ORAL_TABLET | Freq: Four times a day (QID) | ORAL | Status: DC | PRN

## 2014-06-24 MED ORDER — LORAZEPAM 2 MG/ML IJ SOLN
0.2500 mg | Freq: Once | INTRAMUSCULAR | Status: AC
  Administered 2014-06-24: 0.25 mg via INTRAVENOUS
  Filled 2014-06-24: qty 1

## 2014-06-24 NOTE — ED Notes (Signed)
Family spoke with Dr. Dyann Kief.

## 2014-06-24 NOTE — ED Notes (Signed)
Pt monitored by pulse ox, bp cuff, and 12-lead. 

## 2014-06-24 NOTE — Consult Note (Signed)
PULMONARY / CRITICAL CARE MEDICINE   Name: Jodi Price MRN: [id number] DOB: 09-23-1919    ADMISSION DATE:  06/12/2014 CONSULTATION DATE:  06/26/2014  REFERRING MD :  Jodi Price  CHIEF COMPLAINT:  pneumonia  INITIAL PRESENTATION: 06/20/2014 with shortness of breath and hypoxia  STUDIES:  CTA - multifocal pneumonia, underlying fibrosis, no PE  SIGNIFICANT EVENTS: Made comfort care 06/11/2014   HISTORY OF PRESENT ILLNESS:  Jodi Price is a 81 y/o woman with DM2, dementia, GERD, pulmonary fibrosis possibly 2/2 to nitrofuantoin toxicity who was brought to [facility] from her nursing facility with increased shortness of breath and work of breathing.  CTA done in ED which showed multifocal pneumonia and bilateral pleural effusions as well as underlying basilar and anterior fibrosis.   PAST MEDICAL HISTORY :   has a past medical history of Hypertension; Thyroid disease; Acid reflux disease; Depression; Diabetes mellitus; Urinary tract infection; Hypothyroidism; Blood transfusion; GERD (gastroesophageal reflux disease); H/O hiatal hernia; Cancer; Arthritis; Pneumonia; Atrial fibrillation; Chronic kidney disease; Dementia; Hyperlipidemia; Peripheral neuropathy; Umbilical hernia; and Renal insufficiency.  has past surgical history that includes Breast surgery; Back surgery; Eye surgery; and Joint replacement. Prior to Admission medications   Medication Sig Start Date End Date Taking? Authorizing Provider  albuterol (PROVENTIL HFA;VENTOLIN HFA) 108 (90 BASE) MCG/ACT inhaler Inhale 2 puffs into the lungs every 6 (six) hours as needed for wheezing or shortness of breath. Use 2 puffs 3 times daily x 5 days then 2 puffs every 6 hours as needed. 01/21/14  Yes Eugenie Filler, MD  amLODipine (NORVASC) 5 MG tablet Take 5 mg by mouth daily.   Yes Historical Provider, MD  FLUoxetine (PROZAC) 20 MG capsule Take 20 mg by mouth daily.   Yes Historical Provider, MD  furosemide (LASIX) 20 MG tablet Take 1 tablet (20 mg  total) by mouth daily. 03/29/14  Yes Brand Males, MD  insulin aspart (NOVOLOG) 100 UNIT/ML injection Inject 5 Units into the skin 3 (three) times daily before meals.   Yes Historical Provider, MD  LEVEMIR 100 UNIT/ML injection Inject 5 Units into the skin at bedtime. Sliding scale with each meal 04/15/14  Yes Historical Provider, MD  levothyroxine (SYNTHROID, LEVOTHROID) 50 MCG tablet Take 50 mcg by mouth daily.   Yes Historical Provider, MD  magnesium oxide (MAG-OX) 400 MG tablet Take 400 mg by mouth 2 (two) times daily.   Yes Historical Provider, MD  Menthol, Topical Analgesic, 4 % GEL Apply 1 application topically 3 (three) times daily. Apply to neck and clavicle area three times daily   Yes Historical Provider, MD  metoprolol (LOPRESSOR) 50 MG tablet Take 50 mg by mouth daily.   Yes Historical Provider, MD  naproxen sodium (ANAPROX) 220 MG tablet Take 220 mg by mouth 2 (two) times daily with a meal. For 10 days. Starting 06/18/14   Yes Historical Provider, MD  OXYGEN Inhale 2.5 L/min into the lungs continuous.   Yes Historical Provider, MD  polyethylene glycol (MIRALAX / GLYCOLAX) packet Take 17 g by mouth 2 (two) times daily. 01/21/14  Yes Eugenie Filler, MD  potassium chloride (K-DUR) 10 MEQ tablet Take 10 mEq by mouth daily.   Yes Historical Provider, MD  predniSONE (DELTASONE) 5 MG tablet Take 1 tablet (5 mg total) by mouth daily with breakfast. 05/02/14  Yes Tammy S Parrett, NP  ranitidine (ZANTAC) 150 MG tablet Take 1 tablet by mouth 2 (two) times daily.  04/20/14  Yes Historical Provider, MD  Vitamin D, Ergocalciferol, (DRISDOL)  50000 UNITS CAPS capsule Take 50,000 Units by mouth every 7 (seven) days.   Yes Historical Provider, MD   Allergies  Allergen Reactions  . Influenza Vaccines Anaphylaxis  . Nitrofurantoin Other (See Comments)    Avoid, due to possible cause of ILD  . Pneumococcal Vaccines Anaphylaxis    FAMILY HISTORY:  has no family status information on file.  SOCIAL  HISTORY:  reports that she quit smoking about 28 years ago. Her smoking use included Cigarettes. She has a 5 pack-year smoking history. She has never used smokeless tobacco. She reports that she does not drink alcohol or use illicit drugs.  REVIEW OF SYSTEMS:  Could not be obtained as pt is unable to answer questions.   SUBJECTIVE:   VITAL SIGNS: Temp:  [97.3 F (36.3 C)-100.3 F (37.9 C)] 97.3 F (36.3 C) (04/22 1808) Pulse Rate:  [44-145] 131 (04/22 1808) Resp:  [21-42] 36 (04/22 1900) BP: (87-155)/(57-78) 104/57 mmHg (04/22 1808) SpO2:  [88 %-100 %] 93 % (04/22 1808) Weight:  [57.924 kg (127 lb 11.2 oz)-61.9 kg (136 lb 7.4 oz)] 57.924 kg (127 lb 11.2 oz) (04/22 1808) HEMODYNAMICS:    Intake/Output Summary (Last 24 hours) at 06/13/2014 2054 Last data filed at 06/12/2014 1746  Gross per 24 hour  Intake    500 ml  Output    500 ml  Net      0 ml    PHYSICAL EXAMINATION: General:  Laying bed, in moderate respiratory distress Neuro:  Oriented to name only HEENT:  PERRL, EOMI, edentulous Cardiovascular:  Tachycardic, irregularly irregular, II/VI murmur Lungs:  RR 40 on 10L [place], Diffuse bilateral coarse crackles Abdomen:  Soft, NTND, decreased bowel sounds Musculoskeletal:  No edema, no clubbing Skin:   Thin skin.  LABS:  CBC  Recent Labs Lab 06/07/2014 1040  WBC 3.8*  HGB 14.1  HCT 42.9  PLT 175   Coag's No results for input(s): APTT, INR in the last 168 hours. BMET  Recent Labs Lab 06/16/2014 1040  NA 137  K 3.1*  CL 97  CO2 22  BUN 25*  CREATININE 1.04  GLUCOSE 146*   Electrolytes  Recent Labs Lab 06/26/2014 1040  CALCIUM 9.1   Sepsis Markers  Recent Labs Lab 06/06/2014 1102  LATICACIDVEN 1.78   ABG  Recent Labs Lab 06/10/2014 1117  PHART 7.404  PCO2ART 37.2  PO2ART 75.0*   Liver Enzymes  Recent Labs Lab 06/17/2014 1040  AST 36  ALT 15  ALKPHOS 54  BILITOT 0.9  ALBUMIN 3.3*   Cardiac Enzymes  Recent Labs Lab 06/24/14 1040   TROPONINI <0.03   Glucose No results for input(s): GLUCAP in the last 168 hours.  Imaging No results found.   ASSESSMENT / PLAN: I spoke with the patient's daughter, Jodi Price by phone and discussed Jodi Price current respiratory distress and her need for increased respiratory support.  She felt that given her mother's underlying illness that the appropriate thing would be to keep her comfortable and decrease her work of breathing.  She did not think that her mother would want BiPap or high flow nasal canula.  She agreed with starting medications to help with her dyspnea and keep her comfortable.  I discontinued all oral medications and blood draws and ordered a morphine infusion for comfort.  Her family is aware that Ms. Negash may not survive the night and will be returning to the hospital.  I will continue antibiotics and albuterol nebulizers for now as they may provide  some comfort.   FAMILY  - Updated by phone.  Spoke with Kathline Magic of [facility] to inform him of these changes.     TODAY'S SUMMARY: Comfort care.     Reginia Forts MD, PhD Pulmonary and [place] Pager: [phone]  06/23/2014, 8:54 PM

## 2014-06-24 NOTE — ED Notes (Signed)
Spoke with Dr. Dyann Kief, reported the concerns of the family to him.  He will be down to see the pt. When he can.

## 2014-06-24 NOTE — ED Notes (Signed)
Pt coughing up blood tinged sputum x2. EDP informed.

## 2014-06-24 NOTE — ED Notes (Signed)
Spoke with the family and the have expressed concerns about pt. Plan of care.  Paged Dr. Dyann Kief

## 2014-06-24 NOTE — ED Provider Notes (Signed)
CSN: [id number]     Arrival date & time 06/03/2014  1002 History   First MD Initiated Contact with Patient 06/19/2014 1007     Chief Complaint  Patient presents with  . Shortness of Breath     (Consider location/radiation/quality/duration/timing/severity/associated sxs/prior Treatment) HPI Comments: Patient brought to the emergency department by ambulance from [facility]. Patient has been experiencing shortness of breath since this morning. Patient normally is on oxygen, has a history of COPD. Her oxygen saturations dropped prior to arrival. EMS felt she might be experiencing pulmonary edema, placed her on CPAP. She did not tolerate this well during transport. Patient has been exhibiting persistent cough. She reports that she is experiencing pain across her back.  Patient is a 81 y.o. female presenting with shortness of breath.  Shortness of Breath Associated symptoms: cough     Past Medical History  Diagnosis Date  . Hypertension   . Thyroid disease   . Acid reflux disease   . Depression   . Diabetes mellitus   . Urinary tract infection   . Hypothyroidism   . Blood transfusion     hx reaction  . GERD (gastroesophageal reflux disease)     gastrophoresis  . H/O hiatal hernia   . Cancer   . Arthritis   . Pneumonia   . Atrial fibrillation   . Chronic kidney disease   . Dementia   . Hyperlipidemia   . Peripheral neuropathy   . Umbilical hernia   . Renal insufficiency    Past Surgical History  Procedure Laterality Date  . Breast surgery    . Back surgery      1950  . Eye surgery      cataract  . Joint replacement      bilateral knees   History reviewed. No pertinent family history. History  Substance Use Topics  . Smoking status: Former Smoker -- 0.50 packs/day for 10 years    Types: Cigarettes    Quit date: 02/10/1986  . Smokeless tobacco: Never Used  . Alcohol Use: No   OB History    No data available     Review of Systems  Respiratory: Positive  for cough and shortness of breath.   Musculoskeletal: Positive for back pain.  All other systems reviewed and are negative.     Allergies  Influenza vaccines; Nitrofurantoin; and Pneumococcal vaccines  Home Medications   Prior to Admission medications   Medication Sig Start Date End Date Taking? Authorizing Provider  albuterol (PROVENTIL HFA;VENTOLIN HFA) 108 (90 BASE) MCG/ACT inhaler Inhale 2 puffs into the lungs every 6 (six) hours as needed for wheezing or shortness of breath. Use 2 puffs 3 times daily x 5 days then 2 puffs every 6 hours as needed. 01/21/14  Yes Eugenie Filler, MD  amLODipine (NORVASC) 5 MG tablet Take 5 mg by mouth daily.   Yes Historical Provider, MD  FLUoxetine (PROZAC) 20 MG capsule Take 20 mg by mouth daily.   Yes Historical Provider, MD  furosemide (LASIX) 20 MG tablet Take 1 tablet (20 mg total) by mouth daily. 03/29/14  Yes Brand Males, MD  insulin aspart (NOVOLOG) 100 UNIT/ML injection Inject 5 Units into the skin 3 (three) times daily before meals.   Yes Historical Provider, MD  LEVEMIR 100 UNIT/ML injection Inject 5 Units into the skin at bedtime. Sliding scale with each meal 04/15/14  Yes Historical Provider, MD  levothyroxine (SYNTHROID, LEVOTHROID) 50 MCG tablet Take 50 mcg by mouth daily.  Yes Historical Provider, MD  magnesium oxide (MAG-OX) 400 MG tablet Take 400 mg by mouth 2 (two) times daily.   Yes Historical Provider, MD  Menthol, Topical Analgesic, 4 % GEL Apply 1 application topically 3 (three) times daily. Apply to neck and clavicle area three times daily   Yes Historical Provider, MD  metoprolol (LOPRESSOR) 50 MG tablet Take 50 mg by mouth daily.   Yes Historical Provider, MD  naproxen sodium (ANAPROX) 220 MG tablet Take 220 mg by mouth 2 (two) times daily with a meal. For 10 days. Starting 06/18/14   Yes Historical Provider, MD  OXYGEN Inhale 2.5 L/min into the lungs continuous.   Yes Historical Provider, MD  polyethylene glycol (MIRALAX /  GLYCOLAX) packet Take 17 g by mouth 2 (two) times daily. 01/21/14  Yes Eugenie Filler, MD  potassium chloride (K-DUR) 10 MEQ tablet Take 10 mEq by mouth daily.   Yes Historical Provider, MD  predniSONE (DELTASONE) 5 MG tablet Take 1 tablet (5 mg total) by mouth daily with breakfast. 05/02/14  Yes Tammy S Parrett, NP  ranitidine (ZANTAC) 150 MG tablet Take 1 tablet by mouth 2 (two) times daily.  04/20/14  Yes Historical Provider, MD  Vitamin D, Ergocalciferol, (DRISDOL) 50000 UNITS CAPS capsule Take 50,000 Units by mouth every 7 (seven) days.   Yes Historical Provider, MD   BP 125/57 mmHg  Pulse 95  Temp(Src) 98.6 F (37 C) (Oral)  Resp 36  Wt 136 lb 7.4 oz (61.9 kg)  SpO2 95% Physical Exam  Constitutional: She is oriented to person, place, and time. She appears well-developed and well-nourished. No distress.  HENT:  Head: Normocephalic and atraumatic.  Right Ear: Hearing normal.  Left Ear: Hearing normal.  Nose: Nose normal.  Mouth/Throat: Oropharynx is clear and moist and mucous membranes are normal.  Eyes: Conjunctivae and EOM are normal. Pupils are equal, round, and reactive to light.  Neck: Normal range of motion. Neck supple.  Cardiovascular: Regular rhythm, S1 normal and S2 normal.  Exam reveals no gallop and no friction rub.   No murmur heard. Pulmonary/Chest: Effort normal. Tachypnea noted. No respiratory distress. She has decreased breath sounds. She has rhonchi. She has rales. She exhibits no tenderness.  Abdominal: Soft. Normal appearance and bowel sounds are normal. There is no hepatosplenomegaly. There is no tenderness. There is no rebound, no guarding, no tenderness at McBurney's point and negative Murphy's sign. No hernia.  Musculoskeletal: Normal range of motion.  Neurological: She is alert and oriented to person, place, and time. She has normal strength. No cranial nerve deficit or sensory deficit. Coordination normal. GCS eye subscore is 4. GCS verbal subscore is 5. GCS  motor subscore is 6.  Skin: Skin is warm, dry and intact. No rash noted. No cyanosis.  Psychiatric: She has a normal mood and affect. Her speech is normal and behavior is normal. Thought content normal.  Nursing note and vitals reviewed.   ED Course  Procedures (including critical care time) Labs Review Labs Reviewed  CBC WITH DIFFERENTIAL/PLATELET - Abnormal; Notable for the following:    WBC 3.8 (*)    All other components within normal limits  COMPREHENSIVE METABOLIC PANEL - Abnormal; Notable for the following:    Potassium 3.1 (*)    Glucose, Bld 146 (*)    BUN 25 (*)    Albumin 3.3 (*)    GFR calc non Af Amer 45 (*)    GFR calc Af Amer 52 (*)    Anion  gap 18 (*)    All other components within normal limits  URINALYSIS, ROUTINE W REFLEX MICROSCOPIC - Abnormal; Notable for the following:    Color, Urine AMBER (*)    APPearance CLOUDY (*)    Ketones, ur 15 (*)    Protein, ur 30 (*)    Leukocytes, UA SMALL (*)    All other components within normal limits  BRAIN NATRIURETIC PEPTIDE - Abnormal; Notable for the following:    B Natriuretic Peptide 180.3 (*)    All other components within normal limits  URINE MICROSCOPIC-ADD ON - Abnormal; Notable for the following:    Bacteria, UA MANY (*)    Casts HYALINE CASTS (*)    All other components within normal limits  I-STAT ARTERIAL BLOOD GAS, ED - Abnormal; Notable for the following:    pO2, Arterial 75.0 (*)    All other components within normal limits  CULTURE, BLOOD (ROUTINE X 2)  CULTURE, BLOOD (ROUTINE X 2)  URINE CULTURE  TROPONIN I  I-STAT CG4 LACTIC ACID, ED    Imaging Review Ct Angio Chest Pe W/cm &/or Wo Cm  06/07/2014   CLINICAL DATA:  Shortness of breath, pulmonary edema and blood tinged sputum.  EXAM: CT ANGIOGRAPHY CHEST WITH CONTRAST  TECHNIQUE: Multidetector CT imaging of the chest was performed using the standard protocol during bolus administration of intravenous contrast. Multiplanar CT image reconstructions  and MIPs were obtained to evaluate the vascular anatomy.  CONTRAST:  31mL OMNIPAQUE IOHEXOL 350 MG/ML SOLN  COMPARISON:  Chest x-ray earlier today. CT of the chest on 01/28/2014  FINDINGS: The pulmonary arteries are well opacified. There is no evidence of pulmonary embolism. Multifocal rounded areas of airspace disease are seen throughout both upper lobes and lower lobes. The most significant focal area of airspace disease is in the superior segment of the right lower lobe. Findings likely reflect acute bilateral pneumonia. There are stable changes of pulmonary fibrosis at both lung bases. No pleural effusions or pneumothorax identified.  Prominent mediastinal lymph nodes again noted which appear stable and are likely reactive due to chronic lung disease. The largest remains in the lower right peritracheal region a measures approximately 14 mm in short axis. No airway obstruction is identified. The esophagus is mildly distended and fluid-filled. The thoracic aorta is of normal caliber. The thoracic spine shows a stable compression deformity of the T11 vertebral body with approximately 70% loss height. Mild chronic compression fractures of T5 and T8 appear stable.  Review of the MIP images confirms the above findings.  IMPRESSION: 1. No evidence of pulmonary embolism. 2. Multifocal pneumonia in both lungs. 3. Stable pulmonary fibrosis, predominantly affecting the lung bases. 4. Relatively stable mediastinal lymph node enlargement which is likely reactive. 5. Esophageal distention with fluid. 6. Stable compression fractures of T5, T8 and T11.   Electronically Signed   By: Aletta Edouard M.D.   On: 06/04/2014 14:50   Dg Chest Port 1 View  06/18/2014   CLINICAL DATA:  Shortness of breath and cough  EXAM: PORTABLE CHEST - 1 VIEW  COMPARISON:  03/29/2014  FINDINGS: Cardiac shadow is stable. Persistent fibrotic changes are noted within both lungs. The overall appearance is stable from the prior exam. No acute bony  abnormality is noted. Postsurgical changes are seen in the right axilla.  IMPRESSION: Diffuse fibrotic changes.  No definitive acute infiltrate is noted.   Electronically Signed   By: Inez Catalina M.D.   On: 06/23/2014 11:20     EKG  Interpretation   Date/Time:  Friday June 24 2014 10:03:40 EDT Ventricular Rate:  87 PR Interval:  235 QRS Duration: 87 QT Interval:  364 QTC Calculation: 438 R Axis:   -17 Text Interpretation:  Sinus or ectopic atrial rhythm Prolonged PR interval  Abnormal R-wave progression, early transition LVH with secondary  repolarization abnormality No significant change since last tracing  Confirmed by POLLINA  MD, CHRISTOPHER ([id number]) on 07/02/2014 10:08:57 AM      MDM   Final diagnoses:  Shortness of breath  HCAP (healthcare-associated pneumonia)   Patient presents to the ER for evaluation of difficulty breathing. Patient does have a history of COPD and pulmonary fibrosis. She has had worsening breathing difficulty today. There is associated cough with her breathing. She was brought to the ER on CPAP, but did not tolerate it. She was weaned to nasal cannula oxygen and has been oxygenating and ventilating well. Blood gases not show significant CO2 retention.  Initial x-ray did not show any acute pathology, she has a significant amount of fibrosis. CT scan was performed to rule out PE and to further evaluate the lungs. No PE was seen, but there is bilateral pneumonia noted. Patient was treated as sepsis at arrival because she did have a low-grade fever, this included cefepime and vancomycin.  Confirmed with family, she is a DO NOT RESUSCITATE. She will be admitted to the hospital for further management of healthcare associated pneumonia.     Orpah Greek, MD 06/06/2014 1520

## 2014-06-24 NOTE — ED Notes (Signed)
EDP at the bedside.  ?

## 2014-06-24 NOTE — ED Notes (Signed)
Pt to department from [facility] via EMS- pt was brought in for SOB and pulmonary edema. Pt was on bi-pap with ems but not tolerating well. Pt on home 02, sats dropped with EMS en route. Bp-156/88 Hr-88 RR-26 20g LAC.

## 2014-06-24 NOTE — ED Notes (Signed)
Called CT to inform that patient needs to come for scan.

## 2014-06-24 NOTE — Progress Notes (Signed)
ANTIBIOTIC CONSULT NOTE - INITIAL  Pharmacy Consult for Vancomycin, Zosyn  Indication: pneumonia  Allergies  Allergen Reactions  . Influenza Vaccines Anaphylaxis  . Nitrofurantoin Other (See Comments)    Avoid, due to possible cause of ILD  . Pneumococcal Vaccines Anaphylaxis    Patient Measurements:  Actual Body Weight: 61.9 kg  Vital Signs: Temp: 100.3 F (37.9 C) (04/22 1016) Temp Source: Rectal (04/22 1016) BP: 132/76 mmHg (04/22 1006) Pulse Rate: 84 (04/22 1006) Intake/Output from previous day:   Intake/Output from this shift:    Labs: No results for input(s): WBC, HGB, PLT, LABCREA, CREATININE in the last 72 hours. CrCl cannot be calculated (Unknown ideal weight.). No results for input(s): VANCOTROUGH, VANCOPEAK, VANCORANDOM, GENTTROUGH, GENTPEAK, GENTRANDOM, TOBRATROUGH, TOBRAPEAK, TOBRARND, AMIKACINPEAK, AMIKACINTROU, AMIKACIN in the last 72 hours.   Microbiology: No results found for this or any previous visit (from the past 720 hour(s)).  Medical History: Past Medical History  Diagnosis Date  . Hypertension   . Thyroid disease   . Acid reflux disease   . Depression   . Diabetes mellitus   . Urinary tract infection   . Hypothyroidism   . Blood transfusion     hx reaction  . GERD (gastroesophageal reflux disease)     gastrophoresis  . H/O hiatal hernia   . Cancer   . Arthritis   . Pneumonia   . Atrial fibrillation   . Chronic kidney disease   . Dementia   . Hyperlipidemia   . Peripheral neuropathy   . Umbilical hernia     Medications:   (Not in a hospital admission) Assessment: 60 YOF with a history of COPD on oxygen who presented with shortness of breath. CT scan ruled out PE but showed bilateral pneumonia. WBC 3.8, Tm 100.3 F, CrCl ~ 27 mL/min   Cultures: 4/22 Urine Cx>> 4/22 Blood Cx>>   Patient has already received a dose of Vancomycin 1 gm and Cefepime 2 gm IV in the ED.   Goal of Therapy:  Vancomycin trough level 15-20  mcg/ml  Plan:  -Cefepime 1 gm IV Q 24 hours -Vancomycin 750 mg IV Q 24 hours -Monitor CBC, renal fx, cultures and clinical progress -VT at [place], PharmD., BCPS Clinical Pharmacist Pager [phone]

## 2014-06-24 NOTE — ED Notes (Signed)
RT at the bedside.

## 2014-06-24 NOTE — ED Notes (Signed)
Taken to CT with this RN

## 2014-06-24 NOTE — H&P (Signed)
[facility] History and Physical  Jodi Price MWN:[id number] DOB: 1920-01-23 DOA: 06/09/2014  Referring physician: Dr. Betsey Holiday PCP: Adline Mango, MD  Pulmonologist: Dr. Chase Caller  Chief Complaint: Increased shortness of breath, productive cough, low-grade fever.  HPI: Jodi Price is a 81 y.o. female patient with a past medical history significant for dementia, diabetes, chronic respiratory failure due to interstitial lung disease/emphysema, hypothyroidism, hypertension, chronic atrial fibrillation and GERD; who presented to ED from the facility where she resided due to increase shortness of breath and increased work of breathing. Per family members symptom has been present for the last 2-3 days and is steadily worsening. They have been productive cough with mild blood tinged sputum. In the ED patient was found with a low grade temperature, mildly hypoxic (O2 sat 86-87%); increase work of breathing with use of accessory muscles and diffuse wheezing. A CT of the chest has demonstrated multifocal pneumonia with chronic fibrotic/bronchitic changes. BMP was within normal limits and there was not increase vascular congestion seen on images studies. Patient blood pressure was soft initially on presentation with improvement after fluid resuscitation. Lactic acid was within normal limit. There has not been chest pain, abdominal pain, dysuria, headaches, focal weakness, dysarthria, nausea/vomiting, hematuria, melena or any other acute complaints. Patient received fluid resuscitation in the ED, blood cultures were taken, broad-spectrum antibiotics initiated an attempt for BiPAP due to work of breathing was tried (patient didn't tolerated).   After discussing with her family members decision has been to admit the patient for further evaluation and treatment with the intention of medical management without heroic measures intervention and transition to home for care if she failed to improve.   Review  of Systems:  Patient with dementia unable to provide any other complaints other than the ones reported on H&P.  Past Medical History  Diagnosis Date  . Hypertension   . Thyroid disease   . Acid reflux disease   . Depression   . Diabetes mellitus   . Urinary tract infection   . Hypothyroidism   . Blood transfusion     hx reaction  . GERD (gastroesophageal reflux disease)     gastrophoresis  . H/O hiatal hernia   . Cancer   . Arthritis   . Pneumonia   . Atrial fibrillation   . Chronic kidney disease   . Dementia   . Hyperlipidemia   . Peripheral neuropathy   . Umbilical hernia   . Renal insufficiency    Past Surgical History  Procedure Laterality Date  . Breast surgery    . Back surgery      1950  . Eye surgery      cataract  . Joint replacement      bilateral knees   Social History:  reports that she quit smoking about 28 years ago. Her smoking use included Cigarettes. She has a 5 pack-year smoking history. She has never used smokeless tobacco. She reports that she does not drink alcohol or use illicit drugs.  Allergies  Allergen Reactions  . Influenza Vaccines Anaphylaxis  . Nitrofurantoin Other (See Comments)    Avoid, due to possible cause of ILD  . Pneumococcal Vaccines Anaphylaxis   Past medical history: Per family members significant just for hypertension and cholesterol problems. Given patient's dementia, she was unable to elaborate more on family medical history.  Prior to Admission medications   Medication Sig Start Date End Date Taking? Authorizing Provider  albuterol (PROVENTIL HFA;VENTOLIN HFA) 108 (90 BASE) MCG/ACT inhaler Inhale 2 puffs  into the lungs every 6 (six) hours as needed for wheezing or shortness of breath. Use 2 puffs 3 times daily x 5 days then 2 puffs every 6 hours as needed. 01/21/14  Yes Eugenie Filler, MD  amLODipine (NORVASC) 5 MG tablet Take 5 mg by mouth daily.   Yes Historical Provider, MD  FLUoxetine (PROZAC) 20 MG capsule Take  20 mg by mouth daily.   Yes Historical Provider, MD  furosemide (LASIX) 20 MG tablet Take 1 tablet (20 mg total) by mouth daily. 03/29/14  Yes Brand Males, MD  insulin aspart (NOVOLOG) 100 UNIT/ML injection Inject 5 Units into the skin 3 (three) times daily before meals.   Yes Historical Provider, MD  LEVEMIR 100 UNIT/ML injection Inject 5 Units into the skin at bedtime. Sliding scale with each meal 04/15/14  Yes Historical Provider, MD  levothyroxine (SYNTHROID, LEVOTHROID) 50 MCG tablet Take 50 mcg by mouth daily.   Yes Historical Provider, MD  magnesium oxide (MAG-OX) 400 MG tablet Take 400 mg by mouth 2 (two) times daily.   Yes Historical Provider, MD  Menthol, Topical Analgesic, 4 % GEL Apply 1 application topically 3 (three) times daily. Apply to neck and clavicle area three times daily   Yes Historical Provider, MD  metoprolol (LOPRESSOR) 50 MG tablet Take 50 mg by mouth daily.   Yes Historical Provider, MD  naproxen sodium (ANAPROX) 220 MG tablet Take 220 mg by mouth 2 (two) times daily with a meal. For 10 days. Starting 06/18/14   Yes Historical Provider, MD  OXYGEN Inhale 2.5 L/min into the lungs continuous.   Yes Historical Provider, MD  polyethylene glycol (MIRALAX / GLYCOLAX) packet Take 17 g by mouth 2 (two) times daily. 01/21/14  Yes Eugenie Filler, MD  potassium chloride (K-DUR) 10 MEQ tablet Take 10 mEq by mouth daily.   Yes Historical Provider, MD  predniSONE (DELTASONE) 5 MG tablet Take 1 tablet (5 mg total) by mouth daily with breakfast. 05/02/14  Yes Tammy S Parrett, NP  ranitidine (ZANTAC) 150 MG tablet Take 1 tablet by mouth 2 (two) times daily.  04/20/14  Yes Historical Provider, MD  Vitamin D, Ergocalciferol, (DRISDOL) 50000 UNITS CAPS capsule Take 50,000 Units by mouth every 7 (seven) days.   Yes Historical Provider, MD   Physical Exam: Filed Vitals:   06/06/2014 1633 06/27/2014 1700 07/01/2014 1730 06/20/2014 1808  BP: 103/70 120/61 101/68 104/57  Pulse: 134 136 44 131    Temp: 99.5 F (37.5 C)   97.3 F (36.3 C)  TempSrc: Oral   Axillary  Resp: 26 34 28 31  Height:    4\' 11"  (1.499 m)  Weight:    57.924 kg (127 lb 11.2 oz)  SpO2: 90% 88% 91% 93%    Wt Readings from Last 3 Encounters:  06/16/2014 57.924 kg (127 lb 11.2 oz)  04/29/14 61.871 kg (136 lb 6.4 oz)  03/29/14 60.782 kg (134 lb)    General:  Pale, frail and chronically ill looking elderly woman; who Appears to be in acute respiratory distress and labored breathing; patient has a low-grade temperature on her arrival to ED; confuse (but at baseline per family members giving her dementia); small hemoptysis reported but not appreciated on exam Eyes: PERRL, normal lids, irises & conjunctiva, no icterus ENT: grossly normal hearing, no thrush, no exudates; mild dryness of her MM; no drainage out of ears or nostrils. Neck: no LAD, masses or thyromegaly; no JVD Cardiovascular: tachycardia, irregular, no rubs, no gallops;  Telemetry: a. Fib appreciated in ED telemetry. Per family and records not new Respiratory: Fair air movement, diffuse rhonchi, positive expiratory and inspiratory wheezing with decreased breath sounds at the bases. Patient with mild use of accessory muscles for breathing Abdomen: soft, nontender, nondistended, positive bowel sounds Skin: no rash or induration seen on exam Musculoskeletal: grossly normal tone BUE/BLE; no joint swelling; no edema appreciated in her lower extremities Psychiatric: Patient pleasantly confused, but able to follow commands.Stable mood Neurologic: No focal motor or neurologic deficit appreciated; cranial nerve II-12 grossly intact           Labs on Admission:  Basic Metabolic Panel:  Recent Labs Lab 06/30/2014 1040  NA 137  K 3.1*  CL 97  CO2 22  GLUCOSE 146*  BUN 25*  CREATININE 1.04  CALCIUM 9.1   Liver Function Tests:  Recent Labs Lab 06/14/2014 1040  AST 36  ALT 15  ALKPHOS 54  BILITOT 0.9  PROT 7.0  ALBUMIN 3.3*   CBC:  Recent  Labs Lab 06/15/2014 1040  WBC 3.8*  NEUTROABS 2.7  HGB 14.1  HCT 42.9  MCV 94.9  PLT 175   Cardiac Enzymes:  Recent Labs Lab 06/29/2014 1040  TROPONINI <0.03    BNP (last 3 results)  Recent Labs  06/12/2014 1040  BNP 180.3*    ProBNP (last 3 results)  Recent Labs  01/19/14 0850 01/22/14 2052 01/29/14 0300  PROBNP 889.5* 1026.0* 1260.0*   Radiological Exams on Admission: Ct Angio Chest Pe W/cm &/or Wo Cm  06/15/2014   CLINICAL DATA:  Shortness of breath, pulmonary edema and blood tinged sputum.  EXAM: CT ANGIOGRAPHY CHEST WITH CONTRAST  TECHNIQUE: Multidetector CT imaging of the chest was performed using the standard protocol during bolus administration of intravenous contrast. Multiplanar CT image reconstructions and MIPs were obtained to evaluate the vascular anatomy.  CONTRAST:  62mL OMNIPAQUE IOHEXOL 350 MG/ML SOLN  COMPARISON:  Chest x-ray earlier today. CT of the chest on 01/28/2014  FINDINGS: The pulmonary arteries are well opacified. There is no evidence of pulmonary embolism. Multifocal rounded areas of airspace disease are seen throughout both upper lobes and lower lobes. The most significant focal area of airspace disease is in the superior segment of the right lower lobe. Findings likely reflect acute bilateral pneumonia. There are stable changes of pulmonary fibrosis at both lung bases. No pleural effusions or pneumothorax identified.  Prominent mediastinal lymph nodes again noted which appear stable and are likely reactive due to chronic lung disease. The largest remains in the lower right peritracheal region a measures approximately 14 mm in short axis. No airway obstruction is identified. The esophagus is mildly distended and fluid-filled. The thoracic aorta is of normal caliber. The thoracic spine shows a stable compression deformity of the T11 vertebral body with approximately 70% loss height. Mild chronic compression fractures of T5 and T8 appear stable.  Review of  the MIP images confirms the above findings.  IMPRESSION: 1. No evidence of pulmonary embolism. 2. Multifocal pneumonia in both lungs. 3. Stable pulmonary fibrosis, predominantly affecting the lung bases. 4. Relatively stable mediastinal lymph node enlargement which is likely reactive. 5. Esophageal distention with fluid. 6. Stable compression fractures of T5, T8 and T11.   Electronically Signed   By: Aletta Edouard M.D.   On: 06/13/2014 14:50   Dg Chest Port 1 View  06/03/2014   CLINICAL DATA:  Shortness of breath and cough  EXAM: PORTABLE CHEST - 1 VIEW  COMPARISON:  03/29/2014  FINDINGS: Cardiac shadow is stable. Persistent fibrotic changes are noted within both lungs. The overall appearance is stable from the prior exam. No acute bony abnormality is noted. Postsurgical changes are seen in the right axilla.  IMPRESSION: Diffuse fibrotic changes.  No definitive acute infiltrate is noted.   Electronically Signed   By: Inez Catalina M.D.   On: 06/13/2014 11:20    EKG:  Atrial fibrillation; no changes in comparison to previous tracing; poor R wave progression.  Assessment/Plan 1-Acute on chronic respiratory failure with hypoxia: Secondary to exacerbation of her interstitial lung disease/COPD and multifocal pneumonia as appreciated on CT scan of her chest. -Patient will be admitted to [place] of care discussed in details with family expressing potential poor outcome given patient's chronic medical problem and inability to tolerate not even non-invasive ventilatory procedures open (BiPAP). They understand and agree with medical management and watch the patient's response, with concomitant palliative approach and future full comfort care transition if she failed to improve. -Patient will be initiated on broad-spectrum antibiotics given the fact she comes from a facility in order to provide coverage for healthcare associated pneumonia -Will initiate Solu-Medrol 60 mg every 8 hours IV and will provide  nebulizer treatments use and Xopenex/Atrovent and Pulmicort. -Oxygen supplementation to maintain O2 sat above 90% -When necessary morphine for air hunger -Pulmonologist will be seen patient and will follow any further recommendations.  2-DM (diabetes mellitus): Will use long acting home dose and sliding scale insulin  -Anticipating increasing patient's blood sugar secondary to the use of steroids.   3-HTN (hypertension): Patient with soft blood pressure/mild hypertension on admission and responded well to fluid resuscitation. -Will hold antihypertensive agents for now  4-Malnutrition of moderate degree: will start ensure BID  5-Hypothyroidism: will check TSH and continue synthroid  6-Acute on Chronic a-fib: patient is now with HR in 130's-150's -will use rythmol to control HR w/o affecting BP to much -not anticoagulation candidate  7-Dysphagia: will start patient on dysphagia 3 diet  8-Dementia: continue supportive care -advance dementia, not on aricept, exelon or Namenda  9-Depression: continue lexapro  70-WUGQBVQ diastolic heart failure: At this moment compensated. -BNP 180 -No JVD and no vascular congestion seen on x-ray -Daily weights and strict intake and output  11-hypokalemia: Will replete potassium and check magnesium level  PCCM (Discussed with Dr. Chase Caller; at this moment recommendations are for medical management and concomitant palliative care approach based on patient response; They will see patient and provide further recommendations).  Code Status: DO NOT RESUSCITATE DVT Prophylaxis: SCD's Family Communication: Discussed with daughter, granddaughter and son-in-law at bedside Disposition Plan: Will admit to MedSurg bed, inpatient status; LOS > 2 midnights  Time spent: 70 minutes (50% of the time dedicated to face-to-face examination, family updates on patient's condition and as per dictation; coordination of care and consultations made to other  specialist)  [place], [facility] Pager [phone]

## 2014-06-25 DIAGNOSIS — J849 Interstitial pulmonary disease, unspecified: Secondary | ICD-10-CM

## 2014-06-25 LAB — URINE CULTURE

## 2014-06-25 LAB — GLUCOSE, CAPILLARY: Glucose-Capillary: 223 mg/dL — ABNORMAL HIGH (ref 70–99)

## 2014-06-25 LAB — INFLUENZA PANEL BY PCR (TYPE A & B)
H1N1 flu by pcr: NOT DETECTED
INFLAPCR: POSITIVE — AB
INFLBPCR: NEGATIVE

## 2014-06-25 MED ORDER — LEVALBUTEROL HCL 0.63 MG/3ML IN NEBU: 0.6300 mg | INHALATION_SOLUTION | Freq: Four times a day (QID) | RESPIRATORY_TRACT | Status: DC | PRN

## 2014-06-25 MED ORDER — IPRATROPIUM BROMIDE 0.02 % IN SOLN: 0.5000 mg | Freq: Four times a day (QID) | RESPIRATORY_TRACT | Status: DC | PRN

## 2014-06-28 ENCOUNTER — Ambulatory Visit: Payer: Medicare Other | Admitting: Internal Medicine

## 2014-06-30 LAB — CULTURE, BLOOD (ROUTINE X 2)
Culture: NO GROWTH
Culture: NO GROWTH

## 2014-07-01 LAB — CULTURE, BLOOD (ROUTINE X 2)
CULTURE: NO GROWTH
Culture: NO GROWTH

## 2014-07-03 NOTE — Discharge Summary (Addendum)
Death Summary  Jodi Price:[id number] DOB: Sep 05, 1919 DOA: 07-18-14  PCP: Adline Mango, MD  Admit date: Jul 18, 2014 Date of Death: 07-19-14  Time of death: 0819  Cause of death: Pneumonia Pulmonary fibrosis/interstitial lung disease  Other diagnoses:  Principal Problem:   Acute on chronic respiratory failure with hypoxia Active Problems:   DM (diabetes mellitus) 2   HTN (hypertension)   Malnutrition of moderate degree   Hypothyroidism   Chronic a-fib   Dysphagia   Dementia Influenza a  History of present illness:  81 y.o. female patient with a past medical history significant for dementia, diabetes, chronic respiratory failure on home oxygen due to interstitial lung disease/emphysema, hypothyroidism, hypertension, chronic atrial fibrillation and GERD; who presented to ED from the facility where she resided due to increase shortness of breath and increased work of breathing. Per family members symptom has been present for the last 2-3 days and is steadily worsening. They have been productive cough with mild blood tinged sputum. In the ED patient was found with a low grade temperature, mildly hypoxic (O2 sat 86-87%); increase work of breathing with use of accessory muscles and diffuse wheezing. A CT of the chest has demonstrated multifocal pneumonia with chronic fibrotic/bronchitic changes. BMP was within normal limits and there was not increase vascular congestion seen on images studies. Patient blood pressure was soft initially on presentation with improvement after fluid resuscitation. Lactic acid was within normal limit. There has not been chest pain, abdominal pain, dysuria, headaches, focal weakness, dysarthria, nausea/vomiting, hematuria, melena or any other acute complaints. Patient received fluid resuscitation in the ED, blood cultures were taken, broad-spectrum antibiotics initiated an attempt for BiPAP due to work of breathing was tried (patient didn't tolerated).   After  discussing with her family members decision has been to admit the patient for further evaluation and treatment with the intention of medical management without heroic measures intervention and transition to home for care if she failed to improve.     Hospital Course:  Patient was admitted to the hospitalist service. Pulmonary critical care consulted. After discussing with family, the decision was made to stop everything but comfort measures. She was started on morphine and she soon expired.  SignedDelfina Redwood  [facility] 06/26/2014, 4:35 PM

## 2014-07-03 NOTE — Progress Notes (Signed)
IV morphine 236ml wasted with Ramonita Lab, RN.

## 2014-07-03 NOTE — Progress Notes (Signed)
Utilization review complete 

## 2014-07-03 NOTE — Progress Notes (Signed)
Death certificate completed and given to nursing staff.  Doree Barthel, MD

## 2014-07-03 NOTE — Progress Notes (Signed)
MD notified of patient's death at 82. [facility] notified. Spoke with April Shore, reference number documented. Family at bedside.

## 2014-07-03 DEATH — deceased
